# Patient Record
Sex: Male | Born: 1970
Health system: Southern US, Community
[De-identification: ages and names within clinical notes are randomized; demographics above are authoritative.]

## PROBLEM LIST (undated history)

## (undated) DIAGNOSIS — F32A Depression, unspecified: Secondary | ICD-10-CM

## (undated) DIAGNOSIS — E119 Type 2 diabetes mellitus without complications: Secondary | ICD-10-CM

## (undated) DIAGNOSIS — M549 Dorsalgia, unspecified: Secondary | ICD-10-CM

## (undated) DIAGNOSIS — C801 Malignant (primary) neoplasm, unspecified: Secondary | ICD-10-CM

## (undated) DIAGNOSIS — F419 Anxiety disorder, unspecified: Secondary | ICD-10-CM

## (undated) DIAGNOSIS — J329 Chronic sinusitis, unspecified: Secondary | ICD-10-CM

## (undated) DIAGNOSIS — E079 Disorder of thyroid, unspecified: Secondary | ICD-10-CM

## (undated) DIAGNOSIS — I10 Essential (primary) hypertension: Secondary | ICD-10-CM

## (undated) DIAGNOSIS — G4733 Obstructive sleep apnea (adult) (pediatric): Secondary | ICD-10-CM

## (undated) DIAGNOSIS — F329 Major depressive disorder, single episode, unspecified: Secondary | ICD-10-CM

## (undated) DIAGNOSIS — Z9989 Dependence on other enabling machines and devices: Secondary | ICD-10-CM

## (undated) HISTORY — DX: Disorder of thyroid, unspecified: E07.9

## (undated) HISTORY — DX: Essential (primary) hypertension: I10

## (undated) HISTORY — DX: Chronic sinusitis, unspecified: J32.9

## (undated) HISTORY — DX: Malignant (primary) neoplasm, unspecified: C80.1

## (undated) HISTORY — DX: Dependence on other enabling machines and devices: Z99.89

## (undated) HISTORY — DX: Dorsalgia, unspecified: M54.9

## (undated) HISTORY — PX: OTHER SURGICAL HISTORY: SHX169

## (undated) HISTORY — DX: Obstructive sleep apnea (adult) (pediatric): G47.33

---

## 2002-02-01 HISTORY — PX: TOTAL THYROIDECTOMY: SHX2547

## 2003-12-16 ENCOUNTER — Ambulatory Visit: Payer: Self-pay | Admitting: Internal Medicine

## 2004-01-06 ENCOUNTER — Inpatient Hospital Stay: Payer: Self-pay | Admitting: Unknown Physician Specialty

## 2004-01-17 ENCOUNTER — Ambulatory Visit: Payer: Self-pay | Admitting: Oncology

## 2004-01-20 ENCOUNTER — Inpatient Hospital Stay: Payer: Self-pay | Admitting: Unknown Physician Specialty

## 2004-02-24 ENCOUNTER — Ambulatory Visit: Payer: Self-pay | Admitting: Oncology

## 2004-03-04 ENCOUNTER — Ambulatory Visit: Payer: Self-pay | Admitting: Oncology

## 2004-04-01 ENCOUNTER — Ambulatory Visit: Payer: Self-pay | Admitting: Oncology

## 2004-05-02 ENCOUNTER — Ambulatory Visit: Payer: Self-pay | Admitting: Oncology

## 2004-06-01 ENCOUNTER — Ambulatory Visit: Payer: Self-pay | Admitting: Oncology

## 2004-07-02 ENCOUNTER — Ambulatory Visit: Payer: Self-pay | Admitting: Oncology

## 2004-08-14 ENCOUNTER — Ambulatory Visit: Payer: Self-pay | Admitting: Oncology

## 2004-09-01 ENCOUNTER — Ambulatory Visit: Payer: Self-pay | Admitting: Oncology

## 2004-10-02 ENCOUNTER — Ambulatory Visit: Payer: Self-pay | Admitting: Oncology

## 2004-12-16 ENCOUNTER — Ambulatory Visit: Payer: Self-pay | Admitting: Oncology

## 2005-01-01 ENCOUNTER — Ambulatory Visit: Payer: Self-pay | Admitting: Oncology

## 2005-04-13 ENCOUNTER — Ambulatory Visit: Payer: Self-pay | Admitting: Oncology

## 2005-05-02 ENCOUNTER — Ambulatory Visit: Payer: Self-pay | Admitting: Oncology

## 2005-06-10 ENCOUNTER — Emergency Department: Payer: Self-pay | Admitting: General Practice

## 2005-10-12 ENCOUNTER — Ambulatory Visit: Payer: Self-pay | Admitting: Oncology

## 2005-11-01 ENCOUNTER — Ambulatory Visit: Payer: Self-pay | Admitting: Oncology

## 2006-04-15 ENCOUNTER — Ambulatory Visit: Payer: Self-pay | Admitting: Oncology

## 2006-05-03 ENCOUNTER — Ambulatory Visit: Payer: Self-pay | Admitting: Oncology

## 2006-10-03 ENCOUNTER — Ambulatory Visit: Payer: Self-pay | Admitting: Oncology

## 2006-10-12 ENCOUNTER — Ambulatory Visit: Payer: Self-pay | Admitting: Oncology

## 2006-10-21 ENCOUNTER — Ambulatory Visit: Payer: Self-pay | Admitting: Internal Medicine

## 2006-11-02 ENCOUNTER — Ambulatory Visit: Payer: Self-pay | Admitting: Oncology

## 2007-04-11 ENCOUNTER — Ambulatory Visit: Payer: Self-pay | Admitting: Internal Medicine

## 2007-05-03 ENCOUNTER — Ambulatory Visit: Payer: Self-pay | Admitting: Internal Medicine

## 2007-05-29 ENCOUNTER — Ambulatory Visit: Payer: Self-pay | Admitting: Internal Medicine

## 2007-06-02 ENCOUNTER — Ambulatory Visit: Payer: Self-pay | Admitting: Internal Medicine

## 2007-06-07 ENCOUNTER — Ambulatory Visit: Payer: Self-pay | Admitting: Internal Medicine

## 2007-06-15 ENCOUNTER — Ambulatory Visit: Payer: Self-pay | Admitting: Internal Medicine

## 2007-09-20 ENCOUNTER — Ambulatory Visit: Payer: Self-pay | Admitting: Gastroenterology

## 2008-07-24 ENCOUNTER — Ambulatory Visit: Payer: Self-pay | Admitting: Internal Medicine

## 2008-09-24 ENCOUNTER — Other Ambulatory Visit: Payer: Self-pay | Admitting: Internal Medicine

## 2008-10-21 ENCOUNTER — Ambulatory Visit: Payer: Self-pay | Admitting: Cardiovascular Disease

## 2009-02-01 DIAGNOSIS — G4733 Obstructive sleep apnea (adult) (pediatric): Secondary | ICD-10-CM

## 2009-02-01 HISTORY — DX: Obstructive sleep apnea (adult) (pediatric): G47.33

## 2009-04-25 ENCOUNTER — Ambulatory Visit: Payer: Self-pay | Admitting: Internal Medicine

## 2009-05-21 ENCOUNTER — Ambulatory Visit: Payer: Self-pay | Admitting: Internal Medicine

## 2010-04-17 ENCOUNTER — Other Ambulatory Visit: Payer: Self-pay | Admitting: Internal Medicine

## 2010-06-01 ENCOUNTER — Ambulatory Visit: Payer: Self-pay | Admitting: Internal Medicine

## 2010-10-22 ENCOUNTER — Other Ambulatory Visit: Payer: Self-pay | Admitting: Internal Medicine

## 2010-10-23 MED ORDER — LOSARTAN POTASSIUM 50 MG PO TABS: 50.0000 mg | ORAL_TABLET | Freq: Every day | ORAL | Status: DC

## 2011-02-12 ENCOUNTER — Other Ambulatory Visit: Payer: Self-pay | Admitting: *Deleted

## 2011-02-12 NOTE — Telephone Encounter (Signed)
Faxed request from [facility] employee pharmacy, last filled 12/21/10.

## 2011-02-17 MED ORDER — HYDROCODONE-ACETAMINOPHEN 7.5-750 MG PO TABS: 1.0000 | ORAL_TABLET | Freq: Three times a day (TID) | ORAL | Status: AC | PRN

## 2011-02-17 NOTE — Telephone Encounter (Signed)
No, my mistake.

## 2011-02-17 NOTE — Telephone Encounter (Signed)
Pt has been on 7.5/750.  Are you changing the dose?

## 2011-02-17 NOTE — Telephone Encounter (Signed)
Ok to call in #90 with 1 refill on the hydrocodone 5/500 to [facility] pharmacy

## 2011-02-18 ENCOUNTER — Telehealth: Payer: Self-pay | Admitting: Internal Medicine

## 2011-02-18 NOTE — Telephone Encounter (Signed)
Patient said pharmacy will not refill his hydrocodone until the dosage is confirmed.

## 2011-02-18 NOTE — Telephone Encounter (Signed)
Medicine called to pharmacy. 

## 2011-02-26 ENCOUNTER — Encounter: Payer: Self-pay | Admitting: Internal Medicine

## 2011-03-16 ENCOUNTER — Ambulatory Visit (INDEPENDENT_AMBULATORY_CARE_PROVIDER_SITE_OTHER): Payer: PRIVATE HEALTH INSURANCE | Admitting: Internal Medicine

## 2011-03-16 ENCOUNTER — Encounter: Payer: Self-pay | Admitting: Internal Medicine

## 2011-03-16 DIAGNOSIS — F419 Anxiety disorder, unspecified: Secondary | ICD-10-CM

## 2011-03-16 DIAGNOSIS — M545 Low back pain, unspecified: Secondary | ICD-10-CM

## 2011-03-16 DIAGNOSIS — E669 Obesity, unspecified: Secondary | ICD-10-CM

## 2011-03-16 DIAGNOSIS — E039 Hypothyroidism, unspecified: Secondary | ICD-10-CM

## 2011-03-16 DIAGNOSIS — M549 Dorsalgia, unspecified: Secondary | ICD-10-CM

## 2011-03-16 DIAGNOSIS — F411 Generalized anxiety disorder: Secondary | ICD-10-CM

## 2011-03-16 MED ORDER — ALPRAZOLAM 0.5 MG PO TBDP: 0.5000 mg | ORAL_TABLET | Freq: Every day | ORAL | Status: DC | PRN

## 2011-03-16 MED ORDER — ALPRAZOLAM 0.5 MG PO TABS: 0.5000 mg | ORAL_TABLET | Freq: Every day | ORAL | Status: DC | PRN

## 2011-03-16 NOTE — Assessment & Plan Note (Signed)
Doing the low glycemic index diet with q 3 hour snacks but not exercising more than once or twice a week.

## 2011-03-16 NOTE — Patient Instructions (Signed)
EAS  Carb control has less carbs then the muscle milk or the myoplex shakes.  Low carb wheat tortilla by [place]  (BJ and [employer] and [employer])  Joseph's makes a low carb pita bread and flatbread ([employer] and [employer])  Look for the multicolored bag of small peppers as snack

## 2011-03-16 NOTE — Progress Notes (Signed)
Subjective:    Patient ID: Steven Hoover, male    DOB: 26-Feb-1970, 41 y.o.   MRN: [id number]   HPI  Steven Hoover is a 41 yr old white male with a history of hypothyroidism secondary to total thyroidectomy for thyroid CA,  Hypertension and obesity who presents for his annual exam.  He has been following a low glycemic index diet for 6 months, but has not been able to engage in regular exercise due to work and family schedules.  He feels increased stress lately both at work due to impossible deadlines and last minute projects, and at home because his teenage son starts driving this week.  He is however, sleeping well since he started using CPAP every night.  Weight is stable.  Past Medical History  Diagnosis Date  . OSA on CPAP 2011    on auto VPAP, sleeping well humidied 02  . Thyroid disease   . Hypertension    Past Surgical History  Procedure Date  . Total thyroidectomy 2004    Current Outpatient Prescriptions on File Prior to Visit  Medication Sig Dispense Refill  . losartan (COZAAR) 50 MG tablet Take 1 tablet (50 mg total) by mouth daily.  90 tablet  3    Review of Systems  Constitutional: Negative for fever, chills, diaphoresis, activity change, appetite change, fatigue and unexpected weight change.  HENT: Negative for hearing loss, ear pain, nosebleeds, congestion, sore throat, facial swelling, rhinorrhea, sneezing, drooling, mouth sores, trouble swallowing, neck pain, neck stiffness, dental problem, voice change, postnasal drip, sinus pressure, tinnitus and ear discharge.   Eyes: Negative for photophobia, pain, discharge, redness, itching and visual disturbance.  Respiratory: Negative for apnea, cough, choking, chest tightness, shortness of breath, wheezing and stridor.   Cardiovascular: Negative for chest pain, palpitations and leg swelling.  Gastrointestinal: Negative for nausea, vomiting, abdominal pain, diarrhea, constipation, blood in stool, abdominal distention, anal  bleeding and rectal pain.  Genitourinary: Negative for dysuria, urgency, frequency, hematuria, flank pain, decreased urine volume, scrotal swelling, difficulty urinating and testicular pain.  Musculoskeletal: Negative for myalgias, back pain, joint swelling, arthralgias and gait problem.  Skin: Negative for color change, rash and wound.  Neurological: Negative for dizziness, tremors, seizures, syncope, speech difficulty, weakness, light-headedness, numbness and headaches.  Psychiatric/Behavioral: Negative for suicidal ideas, hallucinations, behavioral problems, confusion, sleep disturbance, dysphoric mood, decreased concentration and agitation. The patient is not nervous/anxious.        Objective:   Physical Exam  Constitutional: He is oriented to person, place, and time.  HENT:  Head: Normocephalic and atraumatic.  Mouth/Throat: Oropharynx is clear and moist.  Eyes: Conjunctivae and EOM are normal.  Neck: Normal range of motion. Neck supple. No JVD present. No thyromegaly present.  Cardiovascular: Normal rate, regular rhythm and normal heart sounds.   Pulmonary/Chest: Effort normal and breath sounds normal. He has no wheezes. He has no rales.  Abdominal: Soft. Bowel sounds are normal. He exhibits no mass. There is no tenderness. There is no rebound.  Genitourinary: Prostate normal and penis normal.  Musculoskeletal: Normal range of motion. He exhibits no edema.  Neurological: He is alert and oriented to person, place, and time.  Skin: Skin is warm and dry.  Psychiatric: He has a normal mood and affect.       Assessment & Plan:   Obesity (BMI 30.0-34.9) Doing the low glycemic index diet with q 3 hour snacks but not exercising more than once or twice a week.    Anxiety Spent 10  minutes dicussing sources of stress and wasy to handle.  Prn alprazolam for now, wants to avoid SSRIs bc of weight gain.

## 2011-03-17 DIAGNOSIS — M545 Low back pain, unspecified: Secondary | ICD-10-CM | POA: Insufficient documentation

## 2011-03-17 NOTE — Assessment & Plan Note (Signed)
He has lumbar DDD with occasional episodes of radiculopatjhy . Managed with prednisone and vicodin .

## 2011-03-17 NOTE — Assessment & Plan Note (Signed)
Spent 10 minutes dicussing sources of stress and wasy to handle.  Prn alprazolam for now, wants to avoid SSRIs bc of weight gain.

## 2011-03-24 ENCOUNTER — Telehealth: Payer: Self-pay | Admitting: Internal Medicine

## 2011-03-24 NOTE — Telephone Encounter (Signed)
Please look in the files for his old records before I respond to some areas of concern on his recent September labs    I received his employee labs from September and  A few areas of concern:  Thyroid medication needs to be changed and increased. Can you  confirm his dose so I can change it?   His other two issues I need to clarify with his old records before making any remarks bc 2) His liver enzyme is very elevated and I don't have his old records to compare but I believe he has a history of fatty liver but not sure   3) His hgba1c indicates a diagnosis of diabetes mellitus which I do not have previously in his chart

## 2011-03-26 NOTE — Telephone Encounter (Signed)
Records have been received.  Carollee Herter is looking into the possibility that records have been sent out to be abstracted.  Left message on voice mail for patient to call with synthroid dose.

## 2011-04-12 ENCOUNTER — Encounter: Payer: Self-pay | Admitting: Internal Medicine

## 2011-04-15 ENCOUNTER — Encounter: Payer: Self-pay | Admitting: Internal Medicine

## 2011-04-15 ENCOUNTER — Ambulatory Visit (INDEPENDENT_AMBULATORY_CARE_PROVIDER_SITE_OTHER): Payer: PRIVATE HEALTH INSURANCE | Admitting: Internal Medicine

## 2011-04-15 ENCOUNTER — Telehealth: Payer: Self-pay | Admitting: Internal Medicine

## 2011-04-15 VITALS — BP 108/78 | HR 106 | Temp 98.3°F | Ht 73.5 in | Wt 325.0 lb

## 2011-04-15 DIAGNOSIS — R7989 Other specified abnormal findings of blood chemistry: Secondary | ICD-10-CM

## 2011-04-15 DIAGNOSIS — R7309 Other abnormal glucose: Secondary | ICD-10-CM

## 2011-04-15 DIAGNOSIS — A09 Infectious gastroenteritis and colitis, unspecified: Secondary | ICD-10-CM

## 2011-04-15 DIAGNOSIS — M791 Myalgia, unspecified site: Secondary | ICD-10-CM

## 2011-04-15 DIAGNOSIS — A084 Viral intestinal infection, unspecified: Secondary | ICD-10-CM

## 2011-04-15 DIAGNOSIS — R112 Nausea with vomiting, unspecified: Secondary | ICD-10-CM | POA: Insufficient documentation

## 2011-04-15 DIAGNOSIS — R739 Hyperglycemia, unspecified: Secondary | ICD-10-CM

## 2011-04-15 DIAGNOSIS — K760 Fatty (change of) liver, not elsewhere classified: Secondary | ICD-10-CM | POA: Insufficient documentation

## 2011-04-15 DIAGNOSIS — IMO0001 Reserved for inherently not codable concepts without codable children: Secondary | ICD-10-CM

## 2011-04-15 LAB — TSH: TSH: 4.76 u[IU]/mL (ref 0.35–5.50)

## 2011-04-15 LAB — COMPREHENSIVE METABOLIC PANEL
ALT: 135 U/L — ABNORMAL HIGH (ref 0–53)
AST: 81 U/L — ABNORMAL HIGH (ref 0–37)
Albumin: 4.4 g/dL (ref 3.5–5.2)
Alkaline Phosphatase: 102 U/L (ref 39–117)
BUN: 11 mg/dL (ref 6–23)
CO2: 30 mEq/L (ref 19–32)
Calcium: 9.2 mg/dL (ref 8.4–10.5)
Chloride: 99 mEq/L (ref 96–112)
Creatinine, Ser: 0.9 mg/dL (ref 0.4–1.5)
GFR: 99.02 mL/min (ref >60.00)
Glucose, Bld: 336 mg/dL — ABNORMAL HIGH (ref 70–99)
Potassium: 4.6 mEq/L (ref 3.5–5.1)
Sodium: 136 mEq/L (ref 135–145)
Total Bilirubin: 0.3 mg/dL (ref 0.3–1.2)
Total Protein: 8 g/dL (ref 6.0–8.3)

## 2011-04-15 MED ORDER — ONDANSETRON 8 MG PO TBDP: 8.0000 mg | ORAL_TABLET | Freq: Three times a day (TID) | ORAL | Status: AC | PRN

## 2011-04-15 NOTE — Assessment & Plan Note (Signed)
Likely secondary to viral infection and possibly fatty infiltration the liver. We'll plan to repeat liver function test in one month.

## 2011-04-15 NOTE — Telephone Encounter (Signed)
[facility] Triage Call Report Triage Record Num: [id number] Operator: Geanie Berlin Patient Name: Steven Hoover Call Date & Time: 04/15/2011 10:03:34AM Patient Phone: [phone] PCP: Duncan Dull Patient Gender: Male PCP Fax : [phone] Patient DOB: Dec 28, 1970 Practice Name: [facility] Reason for Call: Caller: Philopater/Patient; PCP: Duncan Dull; CB#: [phone]; Call regarding Body Aches, Vomiting and Diarrhea; Onset: 04/12/11. Afebrile. Last emesis 04/13/11. Mild diarrhea continues with body aches especially in joints. Advised to see MD witin 24 hrs for persistent fatigue that came on suddenly without known causes per Fatigue Guideline and generalized joint pain with headache and fatigue per Muscle Pain Guideline. Appt scheduled for 1315 on 04/15/11 with Dr Dan Humphreys at [place] office. Protocol(s) Used: Fatigue Recommended Outcome per Protocol: See Provider within 24 hours Reason for Outcome: Persistent fatigue that came on suddenly without known cause. Care Advice: ~ Reduce physical stressors as much as possible. ~ Eat a balanced, low-fat, high-fiber diet and have regular meals. ~ Do not change medications or dosing regimen until provider is consulted. ~ SYMPTOM / CONDITION MANAGEMENT ~ List, or take, all current prescription(s), nonprescription or alternative medication(s) to provider for evaluation. ~ Speak with your provider about other symptoms that you may have; also tell them if the symptoms worsened. Call EMS 911 immediately if you have chest pain lasting more than few minutes, SOB, faintness, sweating, nausea or vomiting. ~ 04/15/2011 10:21:53AM Page 1 of 1 [email or web address] [facility] Triage Call Report Triage Record Num: [id number] Operator: Geanie Berlin Patient Name: Steven Hoover Call Date & Time: 04/15/2011 10:03:34AM Patient Phone: [phone] PCP: Duncan Dull Patient Gender: Male PCP Fax : [phone] Patient DOB: 09/21/1970  Practice Name: [facility] Reason for Call: Caller: Aedyn/Patient; PCP: Duncan Dull; CB#: [phone]; Call regarding Body Aches, Vomiting and Diarrhea; Onset: 04/12/11. Afebrile. Last emesis 04/13/11. Mild diarrhea continues with body aches especially in joints. Advised to see MD witin 24 hrs for persistent fatigue that came on suddenly without known causes per Fatigue Guideline and generalized joint pain with headache and fatigue per Muscle Pain Guideline. Appt scheduled for 1315 on 04/15/11 with Dr Dan Humphreys at [place] office. Protocol(s) Used: Muscle Pain Recommended Outcome per Protocol: See Provider within 24 hours Reason for Outcome: Generalized muscle pain AND headache, fatigue, joint pain or rash Care Advice: Call provider if you develop a temperature of 101.5 F (38.6 C) or greater, OR any temperature elevation if geriatric or immunocompromised (such as diabetes, HIV/AIDS, renal disease, chemotherapy, organ transplant, or chronic steroid use). ~ ~ SYMPTOM / CONDITION MANAGEMENT ~ List, or take, all current prescription(s), nonprescription or alternative medication(s) to provider for evaluation. Analgesic/Antipyretic Advice - NSAIDs: Consider aspirin, ibuprofen, naproxen or ketoprofen for pain or fever as directed on label or by pharmacist/provider. PRECAUTIONS: - If over 74 years of age, should not take longer than 1 week without consulting provider. EXCEPTIONS: - Should not be used if taking blood thinners or have bleeding problems. - Do not use if have history of sensitivity/allergy to any of these medications; or history of cardiovascular, ulcer, kidney, liver disease or diabetes unless approved by provider. - Do not exceed recommended dose or frequency. ~ 04/15/2011 10:21:55AM Page 1 of 1 [email or web address]

## 2011-04-15 NOTE — Assessment & Plan Note (Signed)
Likely secondary to viral gastroenteritis. Symptoms gradually improving.  Will use zofran prn nausea. Encouraged increased fluid intake. Will check electrolytes with labs. Follow up if symptoms not improving over the next 48hr.

## 2011-04-15 NOTE — Assessment & Plan Note (Signed)
Likely secondary to viral syndrome. Will check electrolytes and TSH with labs today. Tylenol prn pain. Follow up if symptoms not improving.

## 2011-04-15 NOTE — Progress Notes (Signed)
Subjective:    Patient ID: Steven Hoover, male    DOB: Aug 04, 1970, 41 y.o.   MRN: [id number]  HPI 41 year old male with history of obesity and hypothyroidism presents for acute visit complaining of approximately seven-day history of nausea, vomiting, diarrhea, general malaise, and myalgia. He reports his symptoms first began with nausea and vomiting. This persisted for 2-3 days. He then developed watery, nonbloody diarrhea. This persisted until yesterday. He is now able to tolerate bland foods and was able to tolerate chicken noodle soup for lunch today. He continues to have mild nausea. He notes that he initially had some subjective fever and chills but this has resolved. He now has diffuse myalgia as well as arthralgia. He notes that his son has been ill with similar symptoms. He has not taken any medication for his symptoms.  Outpatient Encounter Prescriptions as of 04/15/2011  Medication Sig Dispense Refill  . ALPRAZolam (XANAX) 0.5 MG tablet Take 1 tablet (0.5 mg total) by mouth daily as needed for anxiety.  20 tablet  0  . HYDROcodone-acetaminophen (VICODIN ES) 7.5-750 MG per tablet Take one by mouth as needed for back pain.      Marland Kitchen levothyroxine (SYNTHROID, LEVOTHROID) 100 MCG tablet Take 100 mcg by mouth daily.      Marland Kitchen levothyroxine (SYNTHROID, LEVOTHROID) 137 MCG tablet Take 137 mcg by mouth daily.      Marland Kitchen losartan (COZAAR) 50 MG tablet Take 1 tablet (50 mg total) by mouth daily.  90 tablet  3  . ondansetron (ZOFRAN-ODT) 8 MG disintegrating tablet Take 1 tablet (8 mg total) by mouth every 8 (eight) hours as needed for nausea.  30 tablet  0    Review of Systems  Constitutional: Positive for fever, chills and fatigue. Negative for activity change, appetite change and unexpected weight change.  Eyes: Negative for visual disturbance.  Respiratory: Negative for cough and shortness of breath.   Cardiovascular: Negative for chest pain, palpitations and leg swelling.  Gastrointestinal:  Positive for nausea, vomiting and diarrhea. Negative for abdominal pain and abdominal distention.  Genitourinary: Negative for dysuria, urgency and difficulty urinating.  Musculoskeletal: Positive for myalgias and arthralgias. Negative for gait problem.  Skin: Negative for color change and rash.  Hematological: Negative for adenopathy.  Psychiatric/Behavioral: Negative for sleep disturbance and dysphoric mood. The patient is not nervous/anxious.    BP 108/78  Pulse 106  Temp(Src) 98.3 F (36.8 C) (Oral)  Ht 6' 1.5" (1.867 m)  Wt 325 lb (147.419 kg)  BMI 42.30 kg/m2  SpO2 97%     Objective:   Physical Exam  Constitutional: He is oriented to person, place, and time. He appears well-developed and well-nourished. No distress.  HENT:  Head: Normocephalic and atraumatic.  Right Ear: External ear normal.  Left Ear: External ear normal.  Nose: Nose normal.  Mouth/Throat: Oropharynx is clear and moist. No oropharyngeal exudate.  Eyes: Conjunctivae and EOM are normal. Pupils are equal, round, and reactive to light. Right eye exhibits no discharge. Left eye exhibits no discharge. No scleral icterus.  Neck: Normal range of motion. Neck supple. No tracheal deviation present. No thyromegaly present.  Cardiovascular: Normal rate, regular rhythm and normal heart sounds.  Exam reveals no gallop and no friction rub.   No murmur heard. Pulmonary/Chest: Effort normal and breath sounds normal. No respiratory distress. He has no wheezes. He has no rales. He exhibits no tenderness.  Abdominal: Soft. Bowel sounds are normal. He exhibits no distension and no mass. There is tenderness (right  lower quadrant, mild). There is no rebound and no guarding.  Musculoskeletal: Normal range of motion. He exhibits no edema.  Lymphadenopathy:    He has no cervical adenopathy.  Neurological: He is alert and oriented to person, place, and time. No cranial nerve deficit. Coordination normal.  Skin: Skin is warm and dry.  No rash noted. He is not diaphoretic. No erythema. No pallor.  Psychiatric: He has a normal mood and affect. His behavior is normal. Judgment and thought content normal.          Assessment & Plan:

## 2011-04-15 NOTE — Assessment & Plan Note (Signed)
Symptoms gradually improving. Encouraged increased fluid intake and rest.  Zofran for nausea. Will stay home from work next 48hr.  Follow up if symptoms not resolved over next 48hr.

## 2011-04-15 NOTE — Assessment & Plan Note (Signed)
Noted on labs today. Review of previous labs shows some mildly elevated blood sugars in the past. Will bring patient in tomorrow to check hemoglobin A1c.

## 2011-04-15 NOTE — Telephone Encounter (Signed)
Call a nurse scheduled patient with Dr. Dan Humphreys.

## 2011-04-16 ENCOUNTER — Encounter: Payer: Self-pay | Admitting: Internal Medicine

## 2011-04-16 ENCOUNTER — Telehealth: Payer: Self-pay | Admitting: *Deleted

## 2011-04-16 ENCOUNTER — Other Ambulatory Visit (INDEPENDENT_AMBULATORY_CARE_PROVIDER_SITE_OTHER): Payer: PRIVATE HEALTH INSURANCE | Admitting: *Deleted

## 2011-04-16 DIAGNOSIS — R7989 Other specified abnormal findings of blood chemistry: Secondary | ICD-10-CM

## 2011-04-16 DIAGNOSIS — IMO0001 Reserved for inherently not codable concepts without codable children: Secondary | ICD-10-CM

## 2011-04-16 DIAGNOSIS — R7309 Other abnormal glucose: Secondary | ICD-10-CM

## 2011-04-16 DIAGNOSIS — E669 Obesity, unspecified: Secondary | ICD-10-CM

## 2011-04-16 DIAGNOSIS — F411 Generalized anxiety disorder: Secondary | ICD-10-CM

## 2011-04-16 DIAGNOSIS — M545 Low back pain, unspecified: Secondary | ICD-10-CM

## 2011-04-16 LAB — COMPREHENSIVE METABOLIC PANEL
ALT: 142 U/L — ABNORMAL HIGH (ref 0–53)
AST: 118 U/L — ABNORMAL HIGH (ref 0–37)
Albumin: 4.1 g/dL (ref 3.5–5.2)
Alkaline Phosphatase: 93 U/L (ref 39–117)
BUN: 13 mg/dL (ref 6–23)
CO2: 25 mEq/L (ref 19–32)
Calcium: 8.8 mg/dL (ref 8.4–10.5)
Chloride: 101 mEq/L (ref 96–112)
Creatinine, Ser: 0.8 mg/dL (ref 0.4–1.5)
GFR: 120.36 mL/min (ref >60.00)
Glucose, Bld: 331 mg/dL — ABNORMAL HIGH (ref 70–99)
Potassium: 4 mEq/L (ref 3.5–5.1)
Sodium: 135 mEq/L (ref 135–145)
Total Bilirubin: 0.7 mg/dL (ref 0.3–1.2)
Total Protein: 7.6 g/dL (ref 6.0–8.3)

## 2011-04-16 LAB — LIPASE: Lipase: 43 U/L (ref 11.0–59.0)

## 2011-04-16 LAB — HEMOGLOBIN A1C: Hgb A1c MFr Bld: 10.5 % — ABNORMAL HIGH (ref 4.6–6.5)

## 2011-04-16 MED ORDER — GLUCOSE BLOOD VI STRP: ORAL_STRIP | Status: AC

## 2011-04-16 MED ORDER — FREESTYLE LANCETS MISC: Status: DC

## 2011-04-16 MED ORDER — METFORMIN HCL 500 MG PO TABS: 500.0000 mg | ORAL_TABLET | Freq: Two times a day (BID) | ORAL | Status: DC

## 2011-04-16 NOTE — Telephone Encounter (Signed)
See result note.  

## 2011-04-16 NOTE — Patient Instructions (Signed)
Consider the Low Glycemic Index Diet and 6 smaller meals daily  To help manage your diabetes and weight :   7 AM Low carbohydrate Protein  Shakes (EAS Carb Control  Or Atkins ,  Available everywhere,   In  cases at BJs )  2.5 carbs  (Add or substitute a toasted sandwhich thin w/ peanut butter)  10 AM: Protein bar by Atkins (snack size,  Chocolate lover's variety at  BJ's)    Lunch: sandwich on pita bread or flatbread (Joseph's makes a pita bread and a flat bread , available at [employer] and [employer]J's; Toufayah makes a low carb flatbread available at [employer] and [employer])   3 PM:  Mid day :  Another protein bar,  Or a  cheese stick, 1/4 cup of almonds, walnuts, pistachios, pecans, peanuts,  Macadamia nuts  6 PM  Dinner:  "mean and green:"  Meat/chicken/fish, salad, and green veggie : use ranch, vinagrette,  Blue cheese, etc  9 PM snack : Breyer's low carb fudgiscle or  ice cream bar (Carb Smart) Weight Watcher's ice cream bar , or another protein shake

## 2011-04-16 NOTE — Telephone Encounter (Signed)
Message copied by Vernie Murders on Fri Apr 16, 2011  5:56 PM ------      Message from: Ronna Polio A      Created: Fri Apr 16, 2011 12:59 PM       I tried to call pt, but no answer. Labs are consistent with diabetes with quite elevated BG. He needs to come back to the office today if possible to get a glucometer to use to monitor BG twice daily over the weekend. I would like to start Metformin 500mg  by mouth twice daily.  He will need to have visit with Dr. Darrick Huntsman (his PCP) to discuss new onset DM this week.

## 2011-04-23 ENCOUNTER — Telehealth: Payer: Self-pay | Admitting: Internal Medicine

## 2011-04-23 ENCOUNTER — Other Ambulatory Visit: Payer: Self-pay | Admitting: Internal Medicine

## 2011-04-23 DIAGNOSIS — E039 Hypothyroidism, unspecified: Secondary | ICD-10-CM

## 2011-04-23 MED ORDER — LEVOTHYROXINE SODIUM 137 MCG PO TABS: 137.0000 ug | ORAL_TABLET | Freq: Every day | ORAL | Status: DC

## 2011-04-23 NOTE — Telephone Encounter (Signed)
Patient wife called wanting a referral to the life style center at [facility] for his diabetes.

## 2011-04-26 NOTE — Telephone Encounter (Signed)
Happy to do so, but warn her that I think they are pretty lenient  on the carb restriction for daibetics.

## 2011-04-27 ENCOUNTER — Encounter: Payer: Self-pay | Admitting: Internal Medicine

## 2011-05-03 ENCOUNTER — Telehealth: Payer: Self-pay | Admitting: Internal Medicine

## 2011-05-03 NOTE — Telephone Encounter (Signed)
Caller: Brenda/Patient; PCP: Duncan Dull; CB#: [phone]; ; ; Call regarding Sore Throat; has some congestion and mild cough as well.  c/o L earache.  Onset of symptoms 04/30/11.  Afebrile.  Per protocol, emergent symptoms denied; advised appt within 24 hours.  Appt sched 05/04/11 0800 in office with Dr. Darrick Huntsman.

## 2011-05-03 NOTE — Telephone Encounter (Signed)
[facility] Triage Call Report Triage Record Num: [id number] Operator: Chevis Pretty Patient Name: Tory Septer Call Date & Time: 05/03/2011 1:41:34PM Patient Phone: [phone] PCP: Duncan Dull Patient Gender: Male PCP Fax : [phone] Patient DOB: [birth year] Practice Name: [facility] Reason for Call: Caller: Abu/Patient; PCP: Duncan Dull; CB#: [phone]; ; ; Call regarding Sore Throat; has some congestion and mild cough as well. c/o L earache. Onset of symptoms 04/30/11. Afebrile. Per protocol, emergent symptoms denied; advised appt within 24 hours. Appt sched 05/04/11 0800 in office with Dr. Darrick Huntsman. Protocol(s) Used: Sore Throat or Hoarseness Recommended Outcome per Protocol: See Provider within 24 hours Reason for Outcome: New onset of painful, swollen glands on sides of neck or under jaw Care Advice: ~ Swollen lymph glands that persist more than two weeks must be evaluated by provider. Call provider if any of the following signs and symptoms develop: severe sore throat, enlarged tonsils with white or yellow patches, tender swollen glands, fever, generally feel ill, persistent low grade headache, or abdominal pain. ~ To help prevent the spread of infection, do not share eating or drinking utensils, personal care items like a toothbrush, or food. Wash hands often with soap and water or alcohol-based hand rub. ~ Avoid exposure to environmental irritants. Do not smoke and avoid second-hand smoke. Avoid outside activities on high pollution days. Instead of strong smelling commercial cleaning products, substitute vinegar and lemon juice. ~ ~ Stay home until your temperature returns to normal. Apply warm, moist soaks or compresses to the affected area for 20-30 minutes 3 to 4 times per day. Avoid burning skin by using water no hotter than bath water and by not lying on the compresses. ~ ~ HEALTH PROMOTION / MAINTENANCE ~ SYMPTOM / CONDITION  MANAGEMENT ~ INFECTION CONTROL ~ CAUTIONS Most adults need to drink 6-10 eight-ounce glasses (1.2-2.0 liters) of fluids per day unless previously told to limit fluid intake for other medical reasons. Limit fluids that contain caffeine, sugar or alcohol. Urine will be a very light yellow color when you drink enough fluids. ~ Analgesic/Antipyretic Advice - Acetaminophen: Consider acetaminophen as directed on label or by pharmacist/provider for pain or fever PRECAUTIONS: - Use if there is no history of liver disease, alcoholism, or intake of three or more alcohol drinks per day - Only if approved by provider during pregnancy or when breastfeeding - During pregnancy, acetaminophen should not be taken more than 3 consecutive days without telling provider - Do not exceed recommended dose or frequency ~ Sore Throat Relief: - Use warm salt water gargles 3 to 4 times/day, as needed (1/2 tsp. salt in 8 oz. [.2 liters] water). - Suck on hard candy, nonprescription or herbal throat lozenges (sugar-free if diabetic) - Eat soothing, soft food/fluids (broths, soups, or honey and lemon juice in hot tea, Popsicles, frozen yogurt or sherbet, scrambled eggs, cooked cereals, Jell-O or puddings) whichever is most comforting. - Avoid eating salty, spicy or acidic foods. ~ ~ Rest until symptoms improve. If more than [redacted] weeks pregnant, lie on left side when resting. If pregnancy is known or suspected, get advice from provider before using any nonprescription medication other than acetaminophen ~ 05/03/2011 1:49:34PM Page 1 of 2 CAN_TriageRpt_V2 [facility] Triage Call Report Patient Name: Javontae Marlette continuation page/s Analgesic/Antipyretic Advice - NSAIDs: Consider aspirin, ibuprofen, naproxen or ketoprofen for pain or fever as directed on label or by pharmacist/provider. PRECAUTIONS: - If over 38 years of age, should not take longer than 1 week without consulting provider.  EXCEPTIONS: - Should not  be used if taking blood thinners or have bleeding problems. - Do not use if have history of sensitivity/allergy to any of these medications; or history of cardiovascular, ulcer, kidney, liver disease or diabetes unless approved by provider. - Do not exceed recommended dose or frequency. ~ 05/03/2011 1:49:34PM Page 2 of 2 CAN_TriageRpt_V2

## 2011-05-04 ENCOUNTER — Ambulatory Visit (INDEPENDENT_AMBULATORY_CARE_PROVIDER_SITE_OTHER): Payer: PRIVATE HEALTH INSURANCE | Admitting: Internal Medicine

## 2011-05-04 ENCOUNTER — Encounter: Payer: Self-pay | Admitting: Internal Medicine

## 2011-05-04 VITALS — BP 126/72 | HR 83 | Temp 97.8°F | Resp 18 | Wt 315.2 lb

## 2011-05-04 DIAGNOSIS — E669 Obesity, unspecified: Secondary | ICD-10-CM

## 2011-05-04 DIAGNOSIS — J029 Acute pharyngitis, unspecified: Secondary | ICD-10-CM

## 2011-05-04 DIAGNOSIS — J329 Chronic sinusitis, unspecified: Secondary | ICD-10-CM

## 2011-05-04 DIAGNOSIS — E119 Type 2 diabetes mellitus without complications: Secondary | ICD-10-CM | POA: Insufficient documentation

## 2011-05-04 DIAGNOSIS — IMO0001 Reserved for inherently not codable concepts without codable children: Secondary | ICD-10-CM

## 2011-05-04 DIAGNOSIS — IMO0002 Reserved for concepts with insufficient information to code with codable children: Secondary | ICD-10-CM

## 2011-05-04 DIAGNOSIS — E1165 Type 2 diabetes mellitus with hyperglycemia: Secondary | ICD-10-CM

## 2011-05-04 DIAGNOSIS — H6690 Otitis media, unspecified, unspecified ear: Secondary | ICD-10-CM

## 2011-05-04 DIAGNOSIS — J020 Streptococcal pharyngitis: Secondary | ICD-10-CM | POA: Insufficient documentation

## 2011-05-04 MED ORDER — AMOXICILLIN-POT CLAVULANATE 875-125 MG PO TABS: 1.0000 | ORAL_TABLET | Freq: Two times a day (BID) | ORAL | Status: AC

## 2011-05-04 NOTE — Assessment & Plan Note (Addendum)
Since hgba1c of 10.5 in march he has been taking metformin twice daily and following the low GI diet.  Sugars are now < 120 fasting and < 150 post prandial.  No changes today. Repeat a1c due in June. Reminded to start exercising in order to be able to start adding back entrees like occasional pizza , unless he wants to add mnore medication.

## 2011-05-04 NOTE — Assessment & Plan Note (Signed)
Improving,  With a 14 lb weight loss since February, achieved using the low glycemic index diet.

## 2011-05-04 NOTE — Progress Notes (Signed)
Patient ID: Steven Hoover, male   DOB: January 11, 1971, 41 y.o.   MRN: [id number]    Patient Active Problem List  Diagnoses  . Obesity (BMI 30.0-34.9)  . Anxiety  . Back pain, lumbosacral  . Nausea and vomiting  . Myalgia  . Viral gastroenteritis  . Hyperglycemia  . Elevated LFTs  . Diabetes mellitus type 2, uncontrolled  . Pharyngitis, streptococcal, acute    Subjective:  CC:   Chief Complaint  Patient presents with  . Sore Throat  . Cough    HPI:   Steven Hoover a 41 y.o. male who presents  Past Medical History  Diagnosis Date  . OSA on CPAP 2011    on auto VPAP, sleeping well humidied 02  . Thyroid disease   . Hypertension   . Back pain   . Sinusitis   . Cancer     thyroid    Past Surgical History  Procedure Date  . Total thyroidectomy 2004  . Adnoidectomy          The following portions of the patient's history were reviewed and updated as appropriate: Allergies, current medications, and problem list.    Review of Systems:   12 Pt  review of systems was negative except those addressed in the HPI,     History   Social History  . Marital Status: Married    Spouse Name: N/A    Number of Children: N/A  . Years of Education: N/A   Occupational History  . Not on file.   Social History Main Topics  . Smoking status: Former Smoker    Quit date: 08/02/2010  . Smokeless tobacco: Never Used  . Alcohol Use: 3.0 oz/week    5 Glasses of wine per week  . Drug Use: Not on file  . Sexually Active: Not on file   Other Topics Concern  . Not on file   Social History Narrative  . No narrative on file    Objective:  BP 126/72  Pulse 83  Temp(Src) 97.8 F (36.6 C) (Oral)  Resp 18  Wt 315 lb 4 oz (142.996 kg)  SpO2 96%  General appearance: alert, cooperative and appears stated age Ears: scarred  TM's, injected and erythematous without bulging.  Throat: lips, mucosa, and tongue normal; teeth and gums normal, tonsils injected and  enlarged.  Neck: mild  adenopathy, no carotid bruit, supple, symmetrical, trachea midline and thyroid not enlarged, symmetric, no tenderness/mass/nodules Back: symmetric, no curvature. ROM normal. No CVA tenderness. Lungs: clear to auscultation bilaterally Heart: regular rate and rhythm, S1, S2 normal, no murmur, click, rub or gallop Abdomen: soft, non-tender; bowel sounds normal; no masses,  no organomegaly Pulses: 2+ and symmetric Skin: Skin color, texture, turgor normal. No rashes or lesions Lymph nodes: Cervical, supraclavicular, and axillary nodes normal.  Assessment and Plan:  Diabetes mellitus type 2, uncontrolled Since hgba1c of 10.5 in march he has been taking metformin twice daily and following the low GI diet.  Sugars are now < 120 fasting and < 150 post prandial.  No changes today. Repeat a1c due in June. Reminded to start exercising in order to be able to start adding back entrees like occasional pizza , unless he wants to add mnore medication.   Obesity (BMI 30.0-34.9) Improving,  With a 14 lb weight loss since February, achieved using the low glycemic index diet.   Pharyngitis, streptococcal, acute Suggested by exam, with sinusitis and otitis symptoms as well.  Augmentin bid x 7 days  Updated Medication List Outpatient Encounter Prescriptions as of 05/04/2011  Medication Sig Dispense Refill  . glucose blood (FREESTYLE LITE) test strip Use twice daily Dx: 250.02  100 each  5  . HYDROcodone-acetaminophen (VICODIN ES) 7.5-750 MG per tablet Take one by mouth as needed for back pain.      . Lancets (FREESTYLE) lancets Use as instructed  100 each  12  . levothyroxine (SYNTHROID, LEVOTHROID) 100 MCG tablet Take 100 mcg by mouth daily.      Marland Kitchen levothyroxine (SYNTHROID, LEVOTHROID) 137 MCG tablet Take 1 tablet (137 mcg total) by mouth daily.  90 tablet  3  . losartan (COZAAR) 50 MG tablet Take 1 tablet (50 mg total) by mouth daily.  90 tablet  3  . metFORMIN (GLUCOPHAGE) 500 MG  tablet Take 1 tablet (500 mg total) by mouth 2 (two) times daily with a meal.  60 tablet  1  . amoxicillin-clavulanate (AUGMENTIN) 875-125 MG per tablet Take 1 tablet by mouth 2 (two) times daily.  14 tablet  0     No orders of the defined types were placed in this encounter.    No Follow-up on file.

## 2011-05-04 NOTE — Assessment & Plan Note (Signed)
Suggested by exam, with sinusitis and otitis symptoms as well.  Augmentin bid x 7 days

## 2011-05-04 NOTE — Patient Instructions (Signed)
You have a viral  Syndrome .  The post nasal drip is causing your sore throat.  Lavage your sinuses twice daly with Simply saline nasal spray.  Use benadryl 25 mg every 8 hours and Sudafed PE 10 to 30 every 8 hours to manage the drainage and congestion.  Gargle with salt water often for the sore throat.  I will call in Cheratussin cough syrup (has codeine) for the cough.  I will call in Augmentin twice daily for 7 days.

## 2011-05-06 ENCOUNTER — Encounter: Payer: Self-pay | Admitting: Internal Medicine

## 2011-05-06 ENCOUNTER — Ambulatory Visit: Payer: PRIVATE HEALTH INSURANCE | Admitting: Internal Medicine

## 2011-05-10 ENCOUNTER — Ambulatory Visit: Payer: Self-pay | Admitting: Internal Medicine

## 2011-05-21 ENCOUNTER — Other Ambulatory Visit: Payer: Self-pay | Admitting: Internal Medicine

## 2011-05-21 DIAGNOSIS — E039 Hypothyroidism, unspecified: Secondary | ICD-10-CM

## 2011-05-21 MED ORDER — LEVOTHYROXINE SODIUM 100 MCG PO TABS: 100.0000 ug | ORAL_TABLET | Freq: Every day | ORAL | Status: DC

## 2011-06-02 ENCOUNTER — Ambulatory Visit: Payer: Self-pay | Admitting: Internal Medicine

## 2011-06-30 ENCOUNTER — Encounter: Payer: Self-pay | Admitting: Internal Medicine

## 2011-07-02 ENCOUNTER — Other Ambulatory Visit: Payer: Self-pay | Admitting: *Deleted

## 2011-07-02 DIAGNOSIS — M549 Dorsalgia, unspecified: Secondary | ICD-10-CM

## 2011-07-02 MED ORDER — HYDROCODONE-ACETAMINOPHEN 7.5-750 MG PO TABS: 1.0000 | ORAL_TABLET | Freq: Four times a day (QID) | ORAL | Status: DC | PRN

## 2011-07-05 ENCOUNTER — Ambulatory Visit: Payer: Self-pay | Admitting: Internal Medicine

## 2011-07-05 NOTE — Telephone Encounter (Signed)
Rx called to [facility] pharmacy. 

## 2011-08-02 ENCOUNTER — Ambulatory Visit: Payer: Self-pay | Admitting: Internal Medicine

## 2011-08-10 ENCOUNTER — Ambulatory Visit (INDEPENDENT_AMBULATORY_CARE_PROVIDER_SITE_OTHER): Payer: PRIVATE HEALTH INSURANCE | Admitting: Internal Medicine

## 2011-08-10 ENCOUNTER — Encounter: Payer: Self-pay | Admitting: Internal Medicine

## 2011-08-10 VITALS — BP 124/76 | HR 90 | Temp 98.5°F | Resp 16 | Wt 307.5 lb

## 2011-08-10 DIAGNOSIS — E1165 Type 2 diabetes mellitus with hyperglycemia: Secondary | ICD-10-CM

## 2011-08-10 DIAGNOSIS — E669 Obesity, unspecified: Secondary | ICD-10-CM

## 2011-08-10 DIAGNOSIS — M545 Low back pain, unspecified: Secondary | ICD-10-CM

## 2011-08-10 DIAGNOSIS — IMO0002 Reserved for concepts with insufficient information to code with codable children: Secondary | ICD-10-CM

## 2011-08-10 DIAGNOSIS — IMO0001 Reserved for inherently not codable concepts without codable children: Secondary | ICD-10-CM

## 2011-08-10 NOTE — Progress Notes (Signed)
Patient ID: Steven Hoover, male   DOB: 23-Feb-1970, 41 y.o.   MRN: [id number]  Patient Active Problem List  Diagnosis  . Obesity (BMI 30.0-34.9)  . Anxiety  . Back pain, lumbosacral  . Nausea and vomiting  . Myalgia  . Viral gastroenteritis  . Hyperglycemia  . Elevated LFTs  . Diabetes mellitus type 2, uncontrolled  . Pharyngitis, streptococcal, acute    Subjective:  CC:   Chief Complaint  Patient presents with  . Diabetes    follow up    HPI:   Steven Rondeau Puckettis a 41 y.o. male who presents Follow up on uncontrolled DM, obesity,  back pain.  He has lost 27 lb since Feb on the low GI diet.  Has more energy, more exercise tolerance.  Off all medications now,  bs 90 to 120 fasting,  2 hr post prandials 150 after a starchy meal, 120 after a low GI meal. Back still a daily issue aggravated by outdoor activities with occasional radiculopathy but infrequent, uses Vicodin twice daily for pain management.  Starting [employer] classes for core strengthening exercise.   Past Medical History  Diagnosis Date  . OSA on CPAP 2011    on auto VPAP, sleeping well humidied 02  . Thyroid disease   . Hypertension   . Back pain   . Sinusitis   . Cancer     thyroid    Past Surgical History  Procedure Date  . Total thyroidectomy 2004  . Adnoidectomy          The following portions of the patient's history were reviewed and updated as appropriate: Allergies, current medications, and problem list.    Review of Systems:   12 Pt  review of systems was negative except those addressed in the HPI,     History   Social History  . Marital Status: Married    Spouse Name: N/A    Number of Children: N/A  . Years of Education: N/A   Occupational History  . Not on file.   Social History Main Topics  . Smoking status: Former Smoker    Quit date: 08/02/2010  . Smokeless tobacco: Never Used  . Alcohol Use: 3.0 oz/week    5 Glasses of wine per week  . Drug Use: Not on file  .  Sexually Active: Not on file   Other Topics Concern  . Not on file   Social History Narrative  . No narrative on file    Objective:  BP 124/76  Pulse 90  Temp 98.5 F (36.9 C) (Oral)  Resp 16  Wt 307 lb 8 oz (139.481 kg)  SpO2 97%  General appearance: alert, cooperative and appears stated age Ears: normal TM's and external ear canals both ears Throat: lips, mucosa, and tongue normal; teeth and gums normal Neck: no adenopathy, no carotid bruit, supple, symmetrical, trachea midline and thyroid not enlarged, symmetric, no tenderness/mass/nodules Back: symmetric, no curvature. ROM normal. No CVA tenderness. Lungs: clear to auscultation bilaterally Heart: regular rate and rhythm, S1, S2 normal, no murmur, click, rub or gallop Abdomen: soft, non-tender; bowel sounds normal; no masses,  no organomegaly Pulses: 2+ and symmetric Skin: Skin color, texture, turgor normal. No rashes or lesions Lymph nodes: Cervical, supraclavicular, and axillary nodes normal.  Assessment and Plan: Diabetes mellitus type 2, uncontrolled Blood sugars are now < 120 fasting and < 15 pot prandial on a low GI diet and he has lst 27 lbs since Feb.  Repeat A1c  Urine micro  al cr ratio due,  Eye exam due  Foot exam done. And normal   Obesity (BMI 30.0-34.9) Improving with low GI diet and exercise ,  No indication for medication at this time.   Back pain, lumbosacral Chronic, controlled with Vicodin,  Not currently sciatic,  Continue wt loss, exercise,    Updated Medication List Outpatient Encounter Prescriptions as of 08/10/2011  Medication Sig Dispense Refill  . glucose blood (FREESTYLE LITE) test strip Use twice daily Dx: 250.02  100 each  5  . HYDROcodone-acetaminophen (VICODIN ES) 7.5-750 MG per tablet Take 1 tablet by mouth every 6 (six) hours as needed for pain. Take one by mouth as needed for back pain.  60 tablet  2  . Lancets (FREESTYLE) lancets Use as instructed  100 each  12  . levothyroxine  (SYNTHROID, LEVOTHROID) 100 MCG tablet Take 1 tablet (100 mcg total) by mouth daily.  90 tablet  3  . levothyroxine (SYNTHROID, LEVOTHROID) 137 MCG tablet Take 1 tablet (137 mcg total) by mouth daily.  90 tablet  3  . losartan (COZAAR) 50 MG tablet Take 1 tablet (50 mg total) by mouth daily.  90 tablet  3  . DISCONTD: metFORMIN (GLUCOPHAGE) 500 MG tablet Take 1 tablet (500 mg total) by mouth 2 (two) times daily with a meal.  60 tablet  1

## 2011-08-10 NOTE — Assessment & Plan Note (Signed)
Blood sugars are now < 120 fasting and < 15 pot prandial on a low GI diet and he has lst 27 lbs since Feb.  Repeat A1c  Urine micro al cr ratio due,  Eye exam due  Foot exam done. And normal

## 2011-08-11 NOTE — Assessment & Plan Note (Signed)
Chronic, controlled with Vicodin,  Not currently sciatic,  Continue wt loss, exercise,

## 2011-08-11 NOTE — Assessment & Plan Note (Signed)
Improving with low GI diet and exercise ,  No indication for medication at this time.

## 2011-08-12 ENCOUNTER — Other Ambulatory Visit: Payer: Self-pay | Admitting: Internal Medicine

## 2011-08-12 LAB — LIPID PANEL
Cholesterol: 94 mg/dL (ref 0–200)
HDL Cholesterol: 40 mg/dL (ref 40–60)
Ldl Cholesterol, Calc: 41 mg/dL (ref 0–100)
Triglycerides: 63 mg/dL (ref 0–200)
VLDL Cholesterol, Calc: 13 mg/dL (ref 5–40)

## 2011-08-12 LAB — HEMOGLOBIN A1C: Hemoglobin A1C: 6.5 % — ABNORMAL HIGH (ref 4.2–6.3)

## 2011-08-15 ENCOUNTER — Telehealth: Payer: Self-pay | Admitting: Internal Medicine

## 2011-08-15 NOTE — Telephone Encounter (Signed)
Congratulation Steven Hoover on lowering his A1c to 6.5 , and his cholesterol is really, really low (LDL 41, trigs 63,  HDL good at 40)  No changes to meds.  Follow up i 3 months

## 2011-08-16 NOTE — Telephone Encounter (Signed)
Patient notified

## 2011-08-24 ENCOUNTER — Encounter: Payer: Self-pay | Admitting: Internal Medicine

## 2011-09-02 ENCOUNTER — Ambulatory Visit: Payer: Self-pay | Admitting: Internal Medicine

## 2011-09-06 LAB — HM DIABETES EYE EXAM

## 2011-09-08 ENCOUNTER — Encounter: Payer: Self-pay | Admitting: Internal Medicine

## 2011-09-22 ENCOUNTER — Ambulatory Visit (INDEPENDENT_AMBULATORY_CARE_PROVIDER_SITE_OTHER): Payer: PRIVATE HEALTH INSURANCE | Admitting: Internal Medicine

## 2011-09-22 ENCOUNTER — Encounter: Payer: Self-pay | Admitting: Internal Medicine

## 2011-09-22 VITALS — BP 122/72 | HR 88 | Temp 98.4°F | Resp 16 | Wt 319.5 lb

## 2011-09-22 DIAGNOSIS — R4589 Other symptoms and signs involving emotional state: Secondary | ICD-10-CM

## 2011-09-22 DIAGNOSIS — E1165 Type 2 diabetes mellitus with hyperglycemia: Secondary | ICD-10-CM

## 2011-09-22 DIAGNOSIS — IMO0001 Reserved for inherently not codable concepts without codable children: Secondary | ICD-10-CM

## 2011-09-22 DIAGNOSIS — M545 Low back pain, unspecified: Secondary | ICD-10-CM

## 2011-09-22 DIAGNOSIS — K429 Umbilical hernia without obstruction or gangrene: Secondary | ICD-10-CM | POA: Insufficient documentation

## 2011-09-22 DIAGNOSIS — IMO0002 Reserved for concepts with insufficient information to code with codable children: Secondary | ICD-10-CM

## 2011-09-22 DIAGNOSIS — F411 Generalized anxiety disorder: Secondary | ICD-10-CM

## 2011-09-22 DIAGNOSIS — F419 Anxiety disorder, unspecified: Secondary | ICD-10-CM

## 2011-09-22 MED ORDER — CITALOPRAM HYDROBROMIDE 10 MG PO TABS: 10.0000 mg | ORAL_TABLET | Freq: Every day | ORAL | Status: DC

## 2011-09-22 MED ORDER — ALPRAZOLAM 1 MG PO TABS: 1.0000 mg | ORAL_TABLET | Freq: Every evening | ORAL | Status: DC | PRN

## 2011-09-22 NOTE — Assessment & Plan Note (Addendum)
secodary to spinal stenosis by April 2011 MRI.  Pain hs been worse lately but not unbearable, managed with vicodin. Urged t continue with wt loss as this is aggravating it, may need repeat MRI and neurosurgical referral

## 2011-09-22 NOTE — Assessment & Plan Note (Addendum)
Now controlled with diet alone. Last hgba1c was 6. In July

## 2011-09-22 NOTE — Assessment & Plan Note (Signed)
Secondary to work stressors. Discussed pharmacologic ways to manage.  Screened for obsessive compulsive behaviors and mania.  Trial of celexa and prn alprazolam. Will titrate dose of citalopram  every 3 to 4 weeks prn

## 2011-09-22 NOTE — Assessment & Plan Note (Signed)
New onset, with constant pain . No color changes or signs of incarceration.  Refer to Steven Hoover for elective surgical repair.

## 2011-09-22 NOTE — Progress Notes (Signed)
Patient ID: [name], male   DOB: Jan 01, 1971, 41 y.o.   MRN: [id number]  Patient Active Problem List  Diagnosis  . Obesity (BMI 30.0-34.9)  . Anxiety  . Back pain, lumbosacral  . Elevated LFTs  . Diabetes mellitus type 2, uncontrolled    Subjective:  CC:   Chief Complaint  Patient presents with  . Hernia    HPI:   Steven Hoover a 41 y.o. male who presents  Past Medical History  Diagnosis Date  . OSA on CPAP 2011    on auto VPAP, sleeping well humidied 02  . Thyroid disease   . Hypertension   . Back pain   . Sinusitis   . Cancer     thyroid    Past Surgical History  Procedure Date  . Total thyroidectomy 2004  . Adnoidectomy          The following portions of the patient's history were reviewed and updated as appropriate: Allergies, current medications, and problem list.    Review of Systems:   12 Pt  review of systems was negative except those addressed in the HPI,     History   Social History  . Marital Status: Married    Spouse Name: N/A    Number of Children: N/A  . Years of Education: N/A   Occupational History  . Not on file.   Social History Main Topics  . Smoking status: Former Smoker    Quit date: 08/02/2010  . Smokeless tobacco: Never Used  . Alcohol Use: 3.0 oz/week    5 Glasses of wine per week  . Drug Use: Not on file  . Sexually Active: Not on file   Other Topics Concern  . Not on file   Social History Narrative  . No narrative on file    Objective:  BP 122/72  Pulse 88  Temp 98.4 F (36.9 C) (Oral)  Resp 16  Wt 319 lb 8 oz (144.924 kg)  SpO2 97%  General appearance: alert, cooperative and appears stated age Ears: normal TM's and external ear canals both ears Throat: lips, mucosa, and tongue normal; teeth and gums normal Neck: no adenopathy, no carotid bruit, supple, symmetrical, trachea midline and thyroid not enlarged, symmetric, no tenderness/mass/nodules Back: symmetric, no curvature. ROM  normal. No CVA tenderness. Lungs: clear to auscultation bilaterally Heart: regular rate and rhythm, S1, S2 normal, no murmur, click, rub or gallop Abdomen: soft, non-tender; bowel sounds normal; no masses,  no organomegaly Pulses: 2+ and symmetric Skin: Skin color, texture, turgor normal. No rashes or lesions Lymph nodes: Cervical, supraclavicular, and axillary nodes normal.  Assessment and Plan:  Hernia, umbilical New onset, with constant pain . No color changes or signs of incarceration.  Refer to Steven Hoover for elective surgical repair.   Diabetes mellitus type 2, uncontrolled Now controlled with diet alone. Last hgba1c was 6. In July  Anxiety Secondary to work stressors. Discussed pharmacologic ways to manage.  Screened for obsessive compulsive behaviors and mania.  Trial of celexa and prn alprazolam. Will titrate dose of citalopram  every 3 to 4 weeks prn   Back pain, lumbosacral secodary to spinal stenosis by April 2011 MRI.  Pain hs been worse lately but not unbearable, managed with vicodin. Urged t continue with wt loss as this is aggravating it, may need repeat MRI and neurosurgical referral   Updated Medication List Outpatient Encounter Prescriptions as of 09/22/2011  Medication Sig Dispense Refill  . glucose blood (FREESTYLE LITE) test  strip Use twice daily Dx: 250.02  100 each  5  . HYDROcodone-acetaminophen (VICODIN ES) 7.5-750 MG per tablet Take 1 tablet by mouth every 6 (six) hours as needed for pain. Take one by mouth as needed for back pain.  60 tablet  2  . Lancets (FREESTYLE) lancets Use as instructed  100 each  12  . levothyroxine (SYNTHROID, LEVOTHROID) 100 MCG tablet Take 1 tablet (100 mcg total) by mouth daily.  90 tablet  3  . levothyroxine (SYNTHROID, LEVOTHROID) 137 MCG tablet Take 1 tablet (137 mcg total) by mouth daily.  90 tablet  3  . losartan (COZAAR) 50 MG tablet Take 1 tablet (50 mg total) by mouth daily.  90 tablet  3  . ALPRAZolam (XANAX) 1 MG tablet  Take 1 tablet (1 mg total) by mouth at bedtime as needed for sleep or anxiety.  30 tablet  3  . citalopram (CELEXA) 10 MG tablet Take 1 tablet (10 mg total) by mouth daily.  30 tablet  3

## 2011-10-01 ENCOUNTER — Encounter: Payer: Self-pay | Admitting: Internal Medicine

## 2011-10-01 DIAGNOSIS — M549 Dorsalgia, unspecified: Secondary | ICD-10-CM

## 2011-10-01 MED ORDER — HYDROCODONE-ACETAMINOPHEN 7.5-750 MG PO TABS: 1.0000 | ORAL_TABLET | Freq: Four times a day (QID) | ORAL | Status: DC | PRN

## 2011-10-01 NOTE — Telephone Encounter (Signed)
rx faxed to pharmacy

## 2011-11-01 ENCOUNTER — Telehealth: Payer: Self-pay | Admitting: Internal Medicine

## 2011-11-01 DIAGNOSIS — I10 Essential (primary) hypertension: Secondary | ICD-10-CM

## 2011-11-01 NOTE — Telephone Encounter (Signed)
Losartan potassium 50 mg 90 tablet Take one by mouth daily

## 2011-11-03 MED ORDER — LOSARTAN POTASSIUM 50 MG PO TABS: 50.0000 mg | ORAL_TABLET | Freq: Every day | ORAL | Status: DC

## 2011-11-03 NOTE — Telephone Encounter (Signed)
Review of record shows 90 days with 3 refills last sept 2012. rx sent

## 2011-11-03 NOTE — Telephone Encounter (Signed)
Requesting refill of Losartan 50 mg 1 po qd. Not on current led list. Please advise.

## 2011-11-10 ENCOUNTER — Ambulatory Visit (INDEPENDENT_AMBULATORY_CARE_PROVIDER_SITE_OTHER): Payer: PRIVATE HEALTH INSURANCE | Admitting: Internal Medicine

## 2011-11-10 ENCOUNTER — Encounter: Payer: Self-pay | Admitting: Internal Medicine

## 2011-11-10 VITALS — BP 130/76 | HR 83 | Temp 98.4°F | Ht 74.0 in | Wt 319.2 lb

## 2011-11-10 DIAGNOSIS — R197 Diarrhea, unspecified: Secondary | ICD-10-CM

## 2011-11-10 DIAGNOSIS — B9789 Other viral agents as the cause of diseases classified elsewhere: Secondary | ICD-10-CM

## 2011-11-10 DIAGNOSIS — B349 Viral infection, unspecified: Secondary | ICD-10-CM

## 2011-11-10 DIAGNOSIS — A09 Infectious gastroenteritis and colitis, unspecified: Secondary | ICD-10-CM

## 2011-11-10 DIAGNOSIS — E032 Hypothyroidism due to medicaments and other exogenous substances: Secondary | ICD-10-CM

## 2011-11-10 MED ORDER — CIPROFLOXACIN HCL 500 MG PO TABS: 500.0000 mg | ORAL_TABLET | Freq: Two times a day (BID) | ORAL | Status: DC

## 2011-11-10 NOTE — Progress Notes (Signed)
Patient ID: Steven Hoover, male   DOB: April 08, 1970, 41 y.o.   MRN: [id number]  Patient Active Problem List  Diagnosis  . Obesity (BMI 30.0-34.9)  . Anxiety  . Back pain, lumbosacral  . Elevated LFTs  . Diabetes mellitus type 2, uncontrolled  . Hernia, umbilical  . Hypothyroidism, iatrogenic  . Diarrhea, infectious, adult    Subjective:  CC:   Chief Complaint  Patient presents with  . Follow-up    HPI:   Steven Cosby Puckettis a 41 y.o. male who presents with Flu like symptoms.  His symptoms started 3 days ago with diarrhea, followed by arthralgias.  He has felt lethargic for the past 3 to4 days.  No vomiting.  4 to 5 runny stools daily. Has been following a bland diet.  No sick contacts, or recent travel.     Past Medical History  Diagnosis Date  . OSA on CPAP 2011    on auto VPAP, sleeping well humidied 02  . Thyroid disease   . Hypertension   . Back pain   . Sinusitis   . Cancer     thyroid    Past Surgical History  Procedure Date  . Total thyroidectomy 2004  . Adnoidectomy          The following portions of the patient's history were reviewed and updated as appropriate: Allergies, current medications, and problem list.    Review of Systems:   12 Pt  review of systems was negative except those addressed in the HPI,     History   Social History  . Marital Status: Married    Spouse Name: N/A    Number of Children: N/A  . Years of Education: N/A   Occupational History  . Not on file.   Social History Main Topics  . Smoking status: Former Smoker    Quit date: 08/02/2010  . Smokeless tobacco: Never Used  . Alcohol Use: 3.0 oz/week    5 Glasses of wine per week  . Drug Use: Not on file  . Sexually Active: Not on file   Other Topics Concern  . Not on file   Social History Narrative  . No narrative on file    Objective:  BP 130/76  Pulse 83  Temp 98.4 F (36.9 C) (Oral)  Ht 6\' 2"  (1.88 m)  Wt 319 lb 4 oz (144.811 kg)  BMI 40.99  kg/m2  SpO2 97%  General appearance: alert, cooperative and appears stated age Ears: normal TM's and external ear canals both ears Throat: lips, mucosa, and tongue normal; teeth and gums normal Neck: no adenopathy, no carotid bruit, supple, symmetrical, trachea midline and thyroid not enlarged, symmetric, no tenderness/mass/nodules Back: symmetric, no curvature. ROM normal. No CVA tenderness. Lungs: clear to auscultation bilaterally Heart: regular rate and rhythm, S1, S2 normal, no murmur, click, rub or gallop Abdomen: soft, non-tender; bowel sounds normal; no masses,  no organomegaly Pulses: 2+ and symmetric Skin: Skin color, texture, turgor normal. No rashes or lesions Lymph nodes: Cervical, supraclavicular, and axillary nodes normal.  Assessment and Plan:  Diarrhea, infectious, adult Ciprofloxacin 500 mg bid x 7 days,  immodium prn.  Clear liquid diet.    Updated Medication List Outpatient Encounter Prescriptions as of 11/10/2011  Medication Sig Dispense Refill  . citalopram (CELEXA) 10 MG tablet Take 1 tablet (10 mg total) by mouth daily.  30 tablet  3  . glucose blood (FREESTYLE LITE) test strip Use twice daily Dx: 250.02  100 each  5  .  HYDROcodone-acetaminophen (VICODIN ES) 7.5-750 MG per tablet Take 1 tablet by mouth every 6 (six) hours as needed for pain. Take one by mouth as needed for back pain.  60 tablet  2  . Lancets (FREESTYLE) lancets Use as instructed  100 each  12  . levothyroxine (SYNTHROID, LEVOTHROID) 100 MCG tablet Take 1 tablet (100 mcg total) by mouth daily.  90 tablet  3  . levothyroxine (SYNTHROID, LEVOTHROID) 137 MCG tablet Take 1 tablet (137 mcg total) by mouth daily.  90 tablet  3  . losartan (COZAAR) 50 MG tablet Take 1 tablet (50 mg total) by mouth daily.  90 tablet  3  . ciprofloxacin (CIPRO) 500 MG tablet Take 1 tablet (500 mg total) by mouth 2 (two) times daily.  14 tablet  0  . losartan (COZAAR) 50 MG tablet Take 1 tablet (50 mg total) by mouth daily.   90 tablet  3     No orders of the defined types were placed in this encounter.    No Follow-up on file.

## 2011-11-10 NOTE — Patient Instructions (Addendum)
You can try 2 tablets of immodium to help with the loose stools.    Try alternating ibuprofen with tylenol for the aches and pains.    Avoid dairy and roughage for a few more days.

## 2011-11-14 DIAGNOSIS — B349 Viral infection, unspecified: Secondary | ICD-10-CM | POA: Insufficient documentation

## 2011-11-14 DIAGNOSIS — A09 Infectious gastroenteritis and colitis, unspecified: Secondary | ICD-10-CM | POA: Insufficient documentation

## 2011-11-14 NOTE — Assessment & Plan Note (Signed)
Ciprofloxacin 500 mg bid x 7 days,  immodium prn.  Clear liquid diet.

## 2011-11-29 ENCOUNTER — Other Ambulatory Visit: Payer: Self-pay | Admitting: Internal Medicine

## 2011-11-29 LAB — COMPREHENSIVE METABOLIC PANEL
Albumin: 3.9 g/dL (ref 3.4–5.0)
Alkaline Phosphatase: 67 U/L (ref 50–136)
Anion Gap: 10 (ref 7–16)
BUN: 12 mg/dL (ref 7–18)
Bilirubin,Total: 0.5 mg/dL (ref 0.2–1.0)
Calcium, Total: 8.4 mg/dL — ABNORMAL LOW (ref 8.5–10.1)
Chloride: 104 mmol/L (ref 98–107)
Co2: 26 mmol/L (ref 21–32)
Creatinine: 0.87 mg/dL (ref 0.60–1.30)
EGFR (African American): >60
EGFR (Non-African Amer.): >60
Glucose: 127 mg/dL — ABNORMAL HIGH (ref 65–99)
Osmolality: 281 (ref 275–301)
Potassium: 4 mmol/L (ref 3.5–5.1)
SGOT(AST): 42 U/L — ABNORMAL HIGH (ref 15–37)
SGPT (ALT): 116 U/L — ABNORMAL HIGH (ref 12–78)
Sodium: 140 mmol/L (ref 136–145)
Total Protein: 7.3 g/dL (ref 6.4–8.2)

## 2011-12-01 ENCOUNTER — Telehealth: Payer: Self-pay | Admitting: Internal Medicine

## 2011-12-01 NOTE — Telephone Encounter (Signed)
and I received labs from October 28 complete metabolic panel and nothing else. No significant change. Liver enzymes still slightly elevated.

## 2011-12-01 NOTE — Telephone Encounter (Signed)
Left message on patient vm with lab results. 

## 2011-12-10 ENCOUNTER — Telehealth: Payer: Self-pay | Admitting: Internal Medicine

## 2011-12-10 ENCOUNTER — Encounter: Payer: Self-pay | Admitting: Internal Medicine

## 2011-12-10 DIAGNOSIS — E032 Hypothyroidism due to medicaments and other exogenous substances: Secondary | ICD-10-CM

## 2011-12-10 DIAGNOSIS — E039 Hypothyroidism, unspecified: Secondary | ICD-10-CM

## 2011-12-10 MED ORDER — LEVOTHYROXINE SODIUM 112 MCG PO TABS
112.0000 ug | ORAL_TABLET | Freq: Every day | ORAL | Status: DC
Start: 2011-12-10 — End: 2012-12-06

## 2011-12-10 NOTE — Telephone Encounter (Signed)
His cholesterol is fine by recent labs. I would like to increase his thyroid medication/.  Please confirm that he is taking 237 mcg daily.

## 2011-12-10 NOTE — Telephone Encounter (Signed)
Patient state that he is taking the 237 mcg

## 2011-12-15 ENCOUNTER — Encounter: Payer: Self-pay | Admitting: Internal Medicine

## 2012-01-05 ENCOUNTER — Other Ambulatory Visit: Payer: Self-pay

## 2012-01-05 DIAGNOSIS — M549 Dorsalgia, unspecified: Secondary | ICD-10-CM

## 2012-01-05 NOTE — Telephone Encounter (Signed)
Refill request for Hydrocodone  Acetaminophen7.5-750 mg. Ok to refill?

## 2012-01-07 ENCOUNTER — Other Ambulatory Visit: Payer: Self-pay

## 2012-01-07 MED ORDER — HYDROCODONE-ACETAMINOPHEN 7.5-750 MG PO TABS: 1.0000 | ORAL_TABLET | Freq: Four times a day (QID) | ORAL | Status: DC | PRN

## 2012-01-07 NOTE — Telephone Encounter (Signed)
Hydrocodone acetaminophen 7.5-750 faxed to [facility]

## 2012-01-24 ENCOUNTER — Other Ambulatory Visit: Payer: Self-pay | Admitting: Internal Medicine

## 2012-01-24 DIAGNOSIS — F411 Generalized anxiety disorder: Secondary | ICD-10-CM

## 2012-01-24 MED ORDER — CITALOPRAM HYDROBROMIDE 10 MG PO TABS: 10.0000 mg | ORAL_TABLET | Freq: Every day | ORAL | Status: DC

## 2012-01-24 NOTE — Telephone Encounter (Signed)
Citalopram HBR 10 mg tab  Take 1 tablet by mouth daily  #30

## 2012-03-18 ENCOUNTER — Other Ambulatory Visit: Payer: Self-pay

## 2012-03-29 ENCOUNTER — Encounter: Payer: Self-pay | Admitting: Internal Medicine

## 2012-03-30 MED ORDER — HYDROCODONE-ACETAMINOPHEN 7.5-300 MG PO TABS: 1.0000 | ORAL_TABLET | Freq: Four times a day (QID) | ORAL | Status: DC | PRN

## 2012-03-30 NOTE — Telephone Encounter (Signed)
Please call or fax the rx for vicdoin 7.5/300 to [facility] pharmacy for patient,  i printed it out

## 2012-03-31 ENCOUNTER — Other Ambulatory Visit: Payer: Self-pay | Admitting: General Practice

## 2012-03-31 MED ORDER — HYDROCODONE-ACETAMINOPHEN 7.5-325 MG PO TABS: 1.0000 | ORAL_TABLET | Freq: Four times a day (QID) | ORAL | Status: DC | PRN

## 2012-03-31 NOTE — Telephone Encounter (Signed)
This was called in

## 2012-04-17 ENCOUNTER — Other Ambulatory Visit: Payer: Self-pay | Admitting: *Deleted

## 2012-04-17 DIAGNOSIS — E039 Hypothyroidism, unspecified: Secondary | ICD-10-CM

## 2012-04-17 MED ORDER — LEVOTHYROXINE SODIUM 137 MCG PO TABS: 137.0000 ug | ORAL_TABLET | Freq: Every day | ORAL | Status: DC

## 2012-04-17 NOTE — Telephone Encounter (Signed)
Please advise. TSH has not been done in a year.

## 2012-04-20 ENCOUNTER — Encounter: Payer: Self-pay | Admitting: Internal Medicine

## 2012-04-20 LAB — HEMOGLOBIN A1C: Hgb A1c MFr Bld: 6.8 % — AB (ref 4.0–6.0)

## 2012-04-22 ENCOUNTER — Encounter: Payer: Self-pay | Admitting: Internal Medicine

## 2012-05-29 ENCOUNTER — Other Ambulatory Visit: Payer: Self-pay | Admitting: *Deleted

## 2012-05-29 DIAGNOSIS — F411 Generalized anxiety disorder: Secondary | ICD-10-CM

## 2012-05-29 MED ORDER — CITALOPRAM HYDROBROMIDE 10 MG PO TABS: 10.0000 mg | ORAL_TABLET | Freq: Every day | ORAL | Status: DC

## 2012-05-29 MED ORDER — GLUCOSE BLOOD VI STRP: ORAL_STRIP | Status: DC

## 2012-05-29 NOTE — Telephone Encounter (Signed)
Refill Request  Free Style Lite Test Strip  #100  Use twice daily

## 2012-07-17 ENCOUNTER — Ambulatory Visit (INDEPENDENT_AMBULATORY_CARE_PROVIDER_SITE_OTHER): Payer: 59 | Admitting: Internal Medicine

## 2012-07-17 ENCOUNTER — Encounter: Payer: Self-pay | Admitting: Internal Medicine

## 2012-07-17 VITALS — BP 112/82 | HR 91 | Temp 98.9°F | Resp 16 | Ht 74.0 in | Wt 318.5 lb

## 2012-07-17 DIAGNOSIS — Z9989 Dependence on other enabling machines and devices: Secondary | ICD-10-CM

## 2012-07-17 DIAGNOSIS — F411 Generalized anxiety disorder: Secondary | ICD-10-CM

## 2012-07-17 DIAGNOSIS — E119 Type 2 diabetes mellitus without complications: Secondary | ICD-10-CM

## 2012-07-17 DIAGNOSIS — E669 Obesity, unspecified: Secondary | ICD-10-CM

## 2012-07-17 DIAGNOSIS — G4733 Obstructive sleep apnea (adult) (pediatric): Secondary | ICD-10-CM | POA: Insufficient documentation

## 2012-07-17 DIAGNOSIS — Z0000 Encounter for general adult medical examination without abnormal findings: Secondary | ICD-10-CM

## 2012-07-17 DIAGNOSIS — R4589 Other symptoms and signs involving emotional state: Secondary | ICD-10-CM

## 2012-07-17 DIAGNOSIS — E032 Hypothyroidism due to medicaments and other exogenous substances: Secondary | ICD-10-CM

## 2012-07-17 DIAGNOSIS — E1169 Type 2 diabetes mellitus with other specified complication: Secondary | ICD-10-CM

## 2012-07-17 MED ORDER — CITALOPRAM HYDROBROMIDE 10 MG PO TABS: 10.0000 mg | ORAL_TABLET | Freq: Every day | ORAL | Status: DC

## 2012-07-17 MED ORDER — ALPRAZOLAM 1 MG PO TABS: 1.0000 mg | ORAL_TABLET | Freq: Every evening | ORAL | Status: DC | PRN

## 2012-07-17 NOTE — Assessment & Plan Note (Addendum)
Diagnosed years ago .Marland Kitchen  No titration in a while.   Gets sleep in the afternoon   Sleep med [facility] 's  Years agop ,  Needs overnight pusle oximetry

## 2012-07-17 NOTE — Assessment & Plan Note (Signed)
I have addressed  BMI and recommended resuming a  low glycemic index diet utilizing smaller more frequent meals to increase metabolism.  I have also recommended that patient start exercising with a goal of 30 minutes of aerobic exercise a minimum of 5 days per week using the [facility] gym .

## 2012-07-17 NOTE — Progress Notes (Signed)
Patient ID: Steven Hoover, male   DOB: November 20, 1970, 42 y.o.   MRN: [id number]  The patient is here for his annual male physical examination and management of other chronic and acute problems, including diabetes mellitus, insomnia, OSA on CPAP, and hypothyroidism .  He has had labile blood sugars for the last several months depending on his diet.    The risk factors are reflected in the social history.  The roster of all physicians providing medical care to patient - is listed in the Snapshot section of the chart.  Activities of daily living:  The patient is 100% independent in all ADLs: dressing, toileting, feeding as well as independent mobility  Home safety : The patient has smoke detectors in the home. He wears seatbelts.  There are no firearms at home. There is no violence in the home.   There is no risks for hepatitis, STDs or HIV. There is no   history of blood transfusion. There is no travel history to infectious disease endemic areas of the world.  The patient has seen their dentist in the last six month and  their eye doctor in the last year.  They do not  have excessive sun exposure. They have seen a dermatoloigist in the last year. Discussed the need for sun protection: hats, long sleeves and use of sunscreen if there is significant sun exposure.   Diet: the importance of a healthy diet is discussed. They do have a healthy diet.  The benefits of regular aerobic exercise were discussed. He does not exercise regularly Depression screen: there are no signs or vegative symptoms of depression- irritability, change in appetite, anhedonia, sadness/tearfullness. However he contineus to have insomnia .  The following portions of the patient's history were reviewed and updated as appropriate: allergies, current medications, past family history, past medical history,  past surgical history, past social history  and problem list.  Visual acuity was not assessed per patient preference since  he has regular follow up with his ophthalmologist. Hearing and body mass index were assessed and reviewed.   During the course of the visit the patient was educated and counseled about appropriate screening and preventive services including :  nutrition counseling, colorectal cancer screening, and recommended immunizations.    Objective:  BP 112/82  Pulse 91  Temp(Src) 98.9 F (37.2 C) (Oral)  Resp 16  Ht 6\' 2"  (1.88 m)  Wt 318 lb 8 oz (144.471 kg)  BMI 40.88 kg/m2  SpO2 98%  BP 112/82  Pulse 91  Temp(Src) 98.9 F (37.2 C) (Oral)  Resp 16  Ht 6\' 2"  (1.88 m)  Wt 318 lb 8 oz (144.471 kg)  BMI 40.88 kg/m2  SpO2 98%  General Appearance:    Alert, cooperative, no distress, appears stated age  Head:    Normocephalic, without obvious abnormality, atraumatic  Eyes:    PERRL, conjunctiva/corneas clear, EOM's intact, fundi    benign, both eyes       Ears:    Normal TM's and external ear canals, both ears  Nose:   Nares normal, septum midline, mucosa normal, no drainage   or sinus tenderness  Throat:   Lips, mucosa, and tongue normal; teeth and gums normal  Neck:   Supple, symmetrical, trachea midline, no adenopathy;       thyroid:  No enlargement/tenderness/nodules; no carotid   bruit or JVD  Back:     Symmetric, no curvature, ROM normal, no CVA tenderness  Lungs:     Clear to auscultation  bilaterally, respirations unlabored  Chest wall:    No tenderness or deformity  Heart:    Regular rate and rhythm, S1 and S2 normal, no murmur, rub   or gallop  Abdomen:     Soft, non-tender, bowel sounds active all four quadrants,    no masses, no organomegaly  Genitalia:    Normal male without lesion, discharge or tenderness  Rectal:    Normal tone, normal prostate, no masses or tenderness;   guaiac negative stool  Extremities:   Extremities normal, atraumatic, no cyanosis or edema  Pulses:   2+ and symmetric all extremities  Skin:   Skin color, texture, turgor normal, no rashes or lesions   Lymph nodes:   Cervical, supraclavicular, and axillary nodes normal  Neurologic:   CNII-XII intact. Normal strength, sensation and reflexes      throughout   Assessment and Plan:  OSA on CPAP Diagnosed years ago .Marland Kitchen  No titration in a while.   Gets sleep in the afternoon   Sleep med [facility] 2 's  Years agop ,  Needs overnight pusle oximetry   Obesity (BMI 30.0-34.9) I have addressed  BMI and recommended resuming a  low glycemic index diet utilizing smaller more frequent meals to increase metabolism.  I have also recommended that patient start exercising with a goal of 30 minutes of aerobic exercise a minimum of 5 days per week using the [facility] gym .   Diabetes mellitus type 2 in obese hgba1c  in March 2014 was 6.8 per patient, done at [facility] .  He has been noncompliant with dietary control and has regained weight .  Repeat A1c is due    Hypothyroidism, iatrogenic Secondary to thyroidectomy.  Repeat TSH and Free T4 due on total dose of 249 mcg   Routine general medical examination at a health care facility Annual male exam was done including testicular  exam. Colon ca screening was reviewed and options given.     Updated Medication List Outpatient Encounter Prescriptions as of 07/17/2012  Medication Sig Dispense Refill  . citalopram (CELEXA) 10 MG tablet Take 1 tablet (10 mg total) by mouth daily.  90 tablet  1  . glucose blood test strip Use twice daily  100 each  0  . levothyroxine (SYNTHROID, LEVOTHROID) 112 MCG tablet Take 1 tablet (112 mcg total) by mouth daily.  90 tablet  3  . levothyroxine (SYNTHROID, LEVOTHROID) 137 MCG tablet Take 1 tablet (137 mcg total) by mouth daily.  90 tablet  3  . losartan (COZAAR) 50 MG tablet Take 1 tablet (50 mg total) by mouth daily.  90 tablet  3  . [DISCONTINUED] citalopram (CELEXA) 10 MG tablet Take 1 tablet (10 mg total) by mouth daily.  30 tablet  1  . [DISCONTINUED] HYDROcodone-acetaminophen (NORCO) 7.5-325 MG per tablet Take 1 tablet by mouth every 6  (six) hours as needed for pain.  60 tablet  3  . ALPRAZolam (XANAX) 1 MG tablet Take 1 tablet (1 mg total) by mouth at bedtime as needed for sleep or anxiety.  30 tablet  5  . ciprofloxacin (CIPRO) 500 MG tablet Take 1 tablet (500 mg total) by mouth 2 (two) times daily.  14 tablet  0  . Hydrocodone-Acetaminophen 7.5-300 MG TABS Take 1 tablet by mouth every 6 (six) hours as needed (pain).  60 each  3  . losartan (COZAAR) 50 MG tablet Take 1 tablet (50 mg total) by mouth daily.  90 tablet  3  . [DISCONTINUED] ALPRAZolam (  XANAX) 1 MG tablet Take 1 tablet (1 mg total) by mouth at bedtime as needed for sleep or anxiety.  30 tablet  3   No facility-administered encounter medications on file as of 07/17/2012.

## 2012-07-17 NOTE — Patient Instructions (Addendum)

## 2012-07-18 ENCOUNTER — Encounter: Payer: Self-pay | Admitting: Internal Medicine

## 2012-07-18 DIAGNOSIS — Z0001 Encounter for general adult medical examination with abnormal findings: Secondary | ICD-10-CM | POA: Insufficient documentation

## 2012-07-18 DIAGNOSIS — Z0000 Encounter for general adult medical examination without abnormal findings: Secondary | ICD-10-CM | POA: Insufficient documentation

## 2012-07-18 LAB — HM DIABETES FOOT EXAM: HM Diabetic Foot Exam: NORMAL

## 2012-07-18 NOTE — Assessment & Plan Note (Signed)
Secondary to thyroidectomy.  Repeat TSH and Free T4 due on total dose of 249 mcg

## 2012-07-18 NOTE — Assessment & Plan Note (Signed)
Annual male exam was done including testicular  exam. Colon ca screening was reviewed and options given.

## 2012-07-18 NOTE — Assessment & Plan Note (Signed)
hgba1c  in March 2014 was 6.8 per patient, done at [facility] .  He has been noncompliant with dietary control and has regained weight .  Repeat A1c is due

## 2012-07-24 ENCOUNTER — Telehealth: Payer: Self-pay | Admitting: *Deleted

## 2012-07-24 DIAGNOSIS — E669 Obesity, unspecified: Secondary | ICD-10-CM

## 2012-07-24 DIAGNOSIS — E1169 Type 2 diabetes mellitus with other specified complication: Secondary | ICD-10-CM

## 2012-07-24 MED ORDER — FREESTYLE LANCETS MISC: Status: DC

## 2012-07-24 NOTE — Telephone Encounter (Signed)
Freestyle lancets  #100  Use as directed

## 2012-07-26 ENCOUNTER — Other Ambulatory Visit: Payer: Self-pay | Admitting: *Deleted

## 2012-07-27 MED ORDER — HYDROCODONE-ACETAMINOPHEN 7.5-300 MG PO TABS: 1.0000 | ORAL_TABLET | Freq: Four times a day (QID) | ORAL | Status: DC | PRN

## 2012-08-23 ENCOUNTER — Other Ambulatory Visit: Payer: Self-pay | Admitting: Internal Medicine

## 2012-08-23 LAB — COMPREHENSIVE METABOLIC PANEL
Albumin: 4.1 g/dL (ref 3.4–5.0)
Alkaline Phosphatase: 84 U/L (ref 50–136)
Anion Gap: 7 (ref 7–16)
BUN: 12 mg/dL (ref 7–18)
Bilirubin,Total: 0.5 mg/dL (ref 0.2–1.0)
Calcium, Total: 8.7 mg/dL (ref 8.5–10.1)
Chloride: 104 mmol/L (ref 98–107)
Co2: 26 mmol/L (ref 21–32)
Creatinine: 0.87 mg/dL (ref 0.60–1.30)
EGFR (African American): >60
EGFR (Non-African Amer.): >60
Glucose: 145 mg/dL — ABNORMAL HIGH (ref 65–99)
Osmolality: 276 (ref 275–301)
Potassium: 3.9 mmol/L (ref 3.5–5.1)
SGOT(AST): 53 U/L — ABNORMAL HIGH (ref 15–37)
SGPT (ALT): 95 U/L — ABNORMAL HIGH (ref 12–78)
Sodium: 137 mmol/L (ref 136–145)
Total Protein: 7.9 g/dL (ref 6.4–8.2)

## 2012-08-23 LAB — CBC WITH DIFFERENTIAL/PLATELET
Basophil #: 0.1 10*3/uL (ref 0.0–0.1)
Basophil %: 1.1 %
Eosinophil #: 0.3 10*3/uL (ref 0.0–0.7)
Eosinophil %: 3.3 %
HCT: 47.6 % (ref 40.0–52.0)
HGB: 16 g/dL (ref 13.0–18.0)
Lymphocyte #: 2.5 10*3/uL (ref 1.0–3.6)
Lymphocyte %: 30.4 %
MCH: 29.5 pg (ref 26.0–34.0)
MCHC: 33.7 g/dL (ref 32.0–36.0)
MCV: 88 fL (ref 80–100)
Monocyte #: 0.8 x10 3/mm (ref 0.2–1.0)
Monocyte %: 10.3 %
Neutrophil #: 4.5 10*3/uL (ref 1.4–6.5)
Neutrophil %: 54.9 %
Platelet: 285 10*3/uL (ref 150–440)
RBC: 5.44 10*6/uL (ref 4.40–5.90)
RDW: 13.5 % (ref 11.5–14.5)
WBC: 8.1 10*3/uL (ref 3.8–10.6)

## 2012-08-23 LAB — TSH: Thyroid Stimulating Horm: 6.24 u[IU]/mL — ABNORMAL HIGH

## 2012-08-23 LAB — LIPID PANEL
Cholesterol: 99 mg/dL (ref 0–200)
HDL Cholesterol: 46 mg/dL (ref 40–60)
Ldl Cholesterol, Calc: 28 mg/dL (ref 0–100)
Triglycerides: 125 mg/dL (ref 0–200)
VLDL Cholesterol, Calc: 25 mg/dL (ref 5–40)

## 2012-08-23 LAB — HEMOGLOBIN A1C
Hemoglobin A1C: 6.8 % — ABNORMAL HIGH (ref 4.2–6.3)
Hgb A1c MFr Bld: 6.8 % — AB (ref 4.0–6.0)

## 2012-08-25 ENCOUNTER — Encounter: Payer: Self-pay | Admitting: Emergency Medicine

## 2012-09-03 ENCOUNTER — Telehealth: Payer: Self-pay | Admitting: Internal Medicine

## 2012-09-03 DIAGNOSIS — E119 Type 2 diabetes mellitus without complications: Secondary | ICD-10-CM

## 2012-09-03 NOTE — Telephone Encounter (Signed)
I received a lab from [facility]; his A1c was  was fine at 6.8 but his lipids, CMET and TSH  were not resulted e has not had his urine micraoalbumin /cr ratio done in over a year and his must be done ASAP. Please have him come by the office to give [place] a urine sample   I also do not have a diabetic eye exam documented on him in over a year.  Please ask him when this was done and if it hasn't ,  We will be happy to refer him, but this must be done annually

## 2012-09-04 NOTE — Telephone Encounter (Signed)
When was his last exam with [facility]?  If he is overdue please maek hin an appt,  Reason:  Diabetes   Thanks

## 2012-09-04 NOTE — Telephone Encounter (Signed)
Thank you amber!

## 2012-09-04 NOTE — Telephone Encounter (Signed)
Patient was last seen at [facility] on 09/05/12. Patient now has an appointment on 8/29 @ 130

## 2012-09-04 NOTE — Telephone Encounter (Signed)
Patient sees [facility] for exam and patient scheduled to give Urine lab 09/05/12 FYI.

## 2012-09-04 NOTE — Telephone Encounter (Signed)
Correction: 09/06/11

## 2012-09-05 ENCOUNTER — Other Ambulatory Visit (INDEPENDENT_AMBULATORY_CARE_PROVIDER_SITE_OTHER): Payer: 59

## 2012-09-05 DIAGNOSIS — E119 Type 2 diabetes mellitus without complications: Secondary | ICD-10-CM

## 2012-09-06 LAB — MICROALBUMIN / CREATININE URINE RATIO
Creatinine,U: 71.5 mg/dL
Microalb Creat Ratio: 0.3 mg/g (ref 0.0–30.0)
Microalb, Ur: 0.2 mg/dL (ref 0.0–1.9)

## 2012-09-07 ENCOUNTER — Encounter: Payer: Self-pay | Admitting: Internal Medicine

## 2012-09-11 ENCOUNTER — Encounter: Payer: Self-pay | Admitting: Internal Medicine

## 2012-10-02 ENCOUNTER — Encounter: Payer: Self-pay | Admitting: Internal Medicine

## 2012-10-06 ENCOUNTER — Telehealth: Payer: Self-pay | Admitting: *Deleted

## 2012-10-06 MED ORDER — GLUCOSE BLOOD VI STRP: ORAL_STRIP | Status: DC

## 2012-10-06 NOTE — Telephone Encounter (Signed)
Refill Request  Freestyle lite test strip  #100  Use twice daily

## 2012-10-06 NOTE — Telephone Encounter (Signed)
Rx sent 

## 2012-11-01 ENCOUNTER — Encounter: Payer: Self-pay | Admitting: Internal Medicine

## 2012-11-02 ENCOUNTER — Other Ambulatory Visit: Payer: Self-pay | Admitting: *Deleted

## 2012-11-02 DIAGNOSIS — I10 Essential (primary) hypertension: Secondary | ICD-10-CM

## 2012-11-02 MED ORDER — LOSARTAN POTASSIUM 50 MG PO TABS: 50.0000 mg | ORAL_TABLET | Freq: Every day | ORAL | Status: DC

## 2012-12-06 ENCOUNTER — Other Ambulatory Visit: Payer: Self-pay | Admitting: *Deleted

## 2012-12-06 DIAGNOSIS — E039 Hypothyroidism, unspecified: Secondary | ICD-10-CM

## 2012-12-06 MED ORDER — LEVOTHYROXINE SODIUM 112 MCG PO TABS: 112.0000 ug | ORAL_TABLET | Freq: Every day | ORAL | Status: DC

## 2012-12-06 NOTE — Telephone Encounter (Signed)
Eprescribed.

## 2012-12-18 ENCOUNTER — Ambulatory Visit: Payer: Self-pay | Admitting: Internal Medicine

## 2012-12-20 ENCOUNTER — Ambulatory Visit: Payer: 59 | Admitting: Internal Medicine

## 2012-12-26 ENCOUNTER — Ambulatory Visit (INDEPENDENT_AMBULATORY_CARE_PROVIDER_SITE_OTHER): Payer: 59 | Admitting: Internal Medicine

## 2012-12-26 ENCOUNTER — Encounter: Payer: Self-pay | Admitting: Internal Medicine

## 2012-12-26 VITALS — BP 128/92 | HR 82 | Temp 98.3°F | Resp 12 | Ht 75.0 in | Wt 318.2 lb

## 2012-12-26 DIAGNOSIS — Z23 Encounter for immunization: Secondary | ICD-10-CM

## 2012-12-26 DIAGNOSIS — M545 Low back pain, unspecified: Secondary | ICD-10-CM

## 2012-12-26 DIAGNOSIS — E1169 Type 2 diabetes mellitus with other specified complication: Secondary | ICD-10-CM

## 2012-12-26 DIAGNOSIS — R7989 Other specified abnormal findings of blood chemistry: Secondary | ICD-10-CM

## 2012-12-26 DIAGNOSIS — M533 Sacrococcygeal disorders, not elsewhere classified: Secondary | ICD-10-CM

## 2012-12-26 DIAGNOSIS — I10 Essential (primary) hypertension: Secondary | ICD-10-CM

## 2012-12-26 DIAGNOSIS — E119 Type 2 diabetes mellitus without complications: Secondary | ICD-10-CM

## 2012-12-26 DIAGNOSIS — E669 Obesity, unspecified: Secondary | ICD-10-CM

## 2012-12-26 DIAGNOSIS — G4733 Obstructive sleep apnea (adult) (pediatric): Secondary | ICD-10-CM

## 2012-12-26 LAB — HM DIABETES FOOT EXAM: HM Diabetic Foot Exam: NORMAL

## 2012-12-26 MED ORDER — METFORMIN HCL 500 MG PO TABS: 500.0000 mg | ORAL_TABLET | Freq: Two times a day (BID) | ORAL | Status: DC

## 2012-12-26 NOTE — Patient Instructions (Signed)
You received the tetanus and the pneumonia vaccines today  Please get your blood pressure checked at [facility]  ,  Goal 130/80 or less  Resume metformin twice daily for diabetes.  Diabetes and Exercise Diabetes mellitus is a common, chronic disease, in which the pancreas is unable to adequately control blood glucose (sugar) levels. There are 2 types of diabetes. Type 1 diabetes patients are unable to produce insulin, a hormone that causes sugar in the blood to be stored in the body. People with type 1 diabetes may compensate by giving themselves injections of insulin. Type 2 diabetes involves not producing adequate amounts of insulin to control blood glucose levels. People with type 2 diabetes control their blood glucose by monitoring their food intake or by taking medicine. Exercise is an important part of diabetes treatment. During exercise, the muscles use a greater amount of glucose from the blood for energy. This lowers your blood glucose, which is the same effect you would get from taking insulin. It has been shown that endurance athletes are more sensitive to insulin than inactive people. SYMPTOMS  Many people with a mild case of diabetes have no symptoms. However, if left uncontrolled, diabetes can lead to several complications that could be prevented with treatment of the disease. General symptoms of diabetes include:   Frequent urination (polyuria).  Frequent thirst and drinking (polydipsia).  Increased food consumption (polyphagia).  Fatigue.  Poor exercise performance.  Blurred vision.  Inflammation of the vagina (vaginitis) caused by fungal infections.  Skin infections (uncommon).  Numbness in the feet,caused by nerve injury.  Kidney disease. CAUSES  The cause of most cases of diabetes is unknown. In children, diabetes is often due to an autoimmune response to the cells in the pancreas that make insulin. It is also linked with other diseases, such as cystic  fibrosis. Diabetes may have a genetic link. PREVENTION  Athletes should strive to begin exercise with blood glucose in a well-controlled state.  Feet should always be kept clean and dry.  Activities in which low blood sugar levels cannot be treated easily (scuba diving, rock climbing, swimming) should be avoided.  Anticipate alterations in diet or training to avoid low blood sugar (hypoglycemia) and high blood sugar (hyperglycemia).  Athletes should try to increase sugar consumption after strenuous exercise to avoid hypoglycemia.  Short-acting insulin should not be injected into an actively exercising muscle. The athlete should rest the injection site for about 1 hour after exercise.  Patients with diabetes should get routine checkups of the feet to prevent complications. PROGNOSIS  Exercise provides many benefits to the person with diabetes:   Reduced body fat.  Lower blood pressure.  Often, reduced need for medicines.  Improved exercise tolerance.  Lower insulin levels.  Weight loss.  Improved lipid profile (decreased cholesterol and low-density lipoproteins). RELATED COMPLICATIONS  If performed incorrectly, exercise can result in complications of diabetes:   Poor control of blood sugar, when exercise is performed at the wrong time.  Increase in renal disease, from loss of body fluids (dehydration).  Increased risk of nerve injury (neuropathy) when performing exercises that increase foot injury.  Increased risk of eye problems when performingactivities that involve breath holding or lowering or jarring the head.  Increased risk of sudden death from exercise in patients with heart disease.  Worsening of hypertension with heavy lifting (more than 10 lb/4.5 kg). Altered blood glucose and insulin dose as a result of mild illness that produces loss of appetite.  Altered uptake of insulin  after injection when insulin injection site is changed. NOTE: Exercise can lower  blood glucose effectively, but the effects are short-lasting (no more than a couple of days). Exercise has been shown to improve your sensitivity to insulin. This may alter how your body responds to a given dose of injected insulin. It is important for every patient with diabetes to know how his or her body may react to exercise, and to adjust insulin dosages accordingly. TREATMENT  Eat about 1 to 3 hours before exercise.  Check blood glucose immediately before and after exercise.  Stop exercise if blood glucose is more than 250 mg/dL.  Stop exercise if blood glucose is less than 100 mg/dL.  Do not exercise within 1 hour of an insulin injection.  Be prepared to treat low blood glucose while exercising. Keep some sugar product with you, such as a candy bar.  For prolonged exercise, use a sports drink to maintain your glucose level.  Replace used up glucose in the body after exercise.  Consume fluids during and after exercise to avoid dehydration. SEEK MEDICAL CARE IF:  You have vision changes after a run.  You notice a loss of sensation in your feet after exercise.  You have increased numbness, tingling, or pins and needles sensations after exercise.  You have chest pain during or after exercise.  You have a fast, irregular heartbeat (palpitations) during or after exercise.  Your exercise tolerance gets worse.  You have fainting or dizzy spells for brief periods during or after exercise. Document Released: 01/18/2005 Document Revised: 04/12/2011 Document Reviewed: 05/02/2008 [facility] Patient Information 2014 [place], [place].

## 2012-12-26 NOTE — Progress Notes (Signed)
Pre-visit discussion using our clinic review tool. No additional management support is needed unless otherwise documented below in the visit note.  

## 2012-12-26 NOTE — Progress Notes (Signed)
Patient ID: Steven Hoover, male   DOB: 04-Jan-1971, 42 y.o.   MRN: [id number]  Patient Active Problem List   Diagnosis Date Noted  . Essential hypertension, benign 12/29/2012  . Routine general medical examination at a health care facility 07/18/2012  . OSA on CPAP 07/17/2012  . Hypothyroidism, iatrogenic 11/10/2011  . Hernia, umbilical 09/22/2011  . Diabetes mellitus type 2 in obese 05/04/2011  . Elevated LFTs 04/15/2011  . Back pain, lumbosacral 03/17/2011  . Obesity (BMI 30.0-34.9) 03/16/2011  . Anxiety 03/16/2011    Subjective:  CC:   Chief Complaint  Patient presents with  . Follow-up    CBGs    HPI:   DARRALL STREY a 42 y.o. male who presents Follow up on Type 2 DM complicated by obesity.  His fasting sugars have been consistently elevated to  150 to 160.  Last a1c was over 3 months ago.  He is following a low GI diet and  exercising twice weekly at the most ,  Hindered from doing more by chronic back pain,  Current work schedule and his children's after school activities.  He works 10 to 12 hours daily at  [employer].     Past Medical History  Diagnosis Date  . OSA on CPAP 2011    on auto VPAP, sleeping well humidied 02  . Thyroid disease   . Hypertension   . Back pain   . Sinusitis   . Cancer     thyroid    Past Surgical History  Procedure Laterality Date  . Total thyroidectomy  2004  . Adnoidectomy         The following portions of the patient's history were reviewed and updated as appropriate: Allergies, current medications, and problem list.    Review of Systems:   12 Pt  review of systems was negative except those addressed in the HPI,     History   Social History  . Marital Status: Married    Spouse Name: N/A    Number of Children: N/A  . Years of Education: N/A   Occupational History  . Not on file.   Social History Main Topics  . Smoking status: Former Smoker    Quit date: 08/02/2010  . Smokeless tobacco: Never Used  .  Alcohol Use: 3.0 oz/week    5 Glasses of wine per week  . Drug Use: Not on file  . Sexual Activity: Not on file   Other Topics Concern  . Not on file   Social History Narrative  . No narrative on file    Objective:  Filed Vitals:   12/26/12 1439  BP: 128/92  Pulse: 82  Temp: 98.3 F (36.8 C)  Resp: 12     General appearance: alert, cooperative and appears stated age Ears: normal TM's and external ear canals both ears Throat: lips, mucosa, and tongue normal; teeth and gums normal Neck: no adenopathy, no carotid bruit, supple, symmetrical, trachea midline and thyroid not enlarged, symmetric, no tenderness/mass/nodules Back: symmetric, no curvature. ROM normal. No CVA tenderness. Lungs: clear to auscultation bilaterally Heart: regular rate and rhythm, S1, S2 normal, no murmur, click, rub or gallop Abdomen: soft, non-tender; bowel sounds normal; no masses,  no organomegaly Pulses: 2+ and symmetric Skin: Skin color, texture, turgor normal. No rashes or lesions Lymph nodes: Cervical, supraclavicular, and axillary nodes normal.  Assessment and Plan:  Diabetes mellitus type 2 in obese Loss of control with diet due to lack of exercise.  Resume metformin 500  mg bid.  I have addressed  BMI and recommended wt loss of 10% of body weigh over the next 6 months using a low glycemic index diet and regular exercise a minimum of 5 days per week.  Lab Results  Component Value Date   HGBA1C 6.7* 12/26/2012   Lab Results  Component Value Date   MICROALBUR 0.2 09/05/2012     Back pain, lumbosacral Continue vicodin prn.   Obesity (BMI 30.0-34.9) I have addressed  BMI and recommended wt loss of 10% of body weight over the next 6 months using a low glycemic index diet and regular exercise a minimum of 5 days per week.    OSA on CPAP Diagnosed by sleep study. he is wearing his CPAP every night a minimum of 6 hours per night.   Essential hypertension, benign .Elevated today.  Reviewed  list of meds, patient is not taking OTC meds that could be causing,. It.  Have asked patient to recheck bp at home a minimum of 5 times over the next 4 weeks and call readings to office for adjustment of medications.     Updated Medication List Outpatient Encounter Prescriptions as of 12/26/2012  Medication Sig  . citalopram (CELEXA) 10 MG tablet Take 1 tablet (10 mg total) by mouth daily.  Marland Kitchen glucose blood test strip Use twice daily  . Hydrocodone-Acetaminophen 7.5-300 MG TABS Take 1 tablet by mouth every 6 (six) hours as needed (pain).  . Lancets (FREESTYLE) lancets Use as directed  . levothyroxine (SYNTHROID, LEVOTHROID) 112 MCG tablet Take 1 tablet (112 mcg total) by mouth daily.  Marland Kitchen levothyroxine (SYNTHROID, LEVOTHROID) 137 MCG tablet Take 1 tablet (137 mcg total) by mouth daily.  Marland Kitchen losartan (COZAAR) 50 MG tablet Take 1 tablet (50 mg total) by mouth daily.  Marland Kitchen losartan (COZAAR) 50 MG tablet Take 1 tablet (50 mg total) by mouth daily.  . metFORMIN (GLUCOPHAGE) 500 MG tablet Take 1 tablet (500 mg total) by mouth 2 (two) times daily with a meal.  . [DISCONTINUED] ciprofloxacin (CIPRO) 500 MG tablet Take 1 tablet (500 mg total) by mouth 2 (two) times daily.     Orders Placed This Encounter  Procedures  . Tdap vaccine greater than or equal to 7yo IM  . Pneumococcal conjugate vaccine 13-valent  . Hemoglobin A1c  . Comprehensive metabolic panel  . TSH  . LDL cholesterol, direct  . Ambulatory referral to Ophthalmology  . HM DIABETES FOOT EXAM    No Follow-up on file.

## 2012-12-27 LAB — COMPREHENSIVE METABOLIC PANEL
ALT: 74 U/L — ABNORMAL HIGH (ref 0–53)
AST: 39 U/L — ABNORMAL HIGH (ref 0–37)
Albumin: 4.1 g/dL (ref 3.5–5.2)
Alkaline Phosphatase: 57 U/L (ref 39–117)
BUN: 11 mg/dL (ref 6–23)
CO2: 23 mEq/L (ref 19–32)
Calcium: 9 mg/dL (ref 8.4–10.5)
Chloride: 102 mEq/L (ref 96–112)
Creatinine, Ser: 0.9 mg/dL (ref 0.4–1.5)
GFR: 104.9 mL/min (ref >60.00)
Glucose, Bld: 148 mg/dL — ABNORMAL HIGH (ref 70–99)
Potassium: 3.9 mEq/L (ref 3.5–5.1)
Sodium: 135 mEq/L (ref 135–145)
Total Bilirubin: 0.7 mg/dL (ref 0.3–1.2)
Total Protein: 7.6 g/dL (ref 6.0–8.3)

## 2012-12-27 LAB — LDL CHOLESTEROL, DIRECT: Direct LDL: 53.6 mg/dL

## 2012-12-27 LAB — HEMOGLOBIN A1C: Hgb A1c MFr Bld: 6.7 % — ABNORMAL HIGH (ref 4.6–6.5)

## 2012-12-27 LAB — TSH: TSH: 2.16 u[IU]/mL (ref 0.35–5.50)

## 2012-12-29 ENCOUNTER — Encounter: Payer: Self-pay | Admitting: Internal Medicine

## 2012-12-29 DIAGNOSIS — R7989 Other specified abnormal findings of blood chemistry: Secondary | ICD-10-CM | POA: Insufficient documentation

## 2012-12-29 DIAGNOSIS — I10 Essential (primary) hypertension: Secondary | ICD-10-CM | POA: Insufficient documentation

## 2012-12-29 NOTE — Assessment & Plan Note (Signed)
Elevated today.  Reviewed list of meds, patient is not taking OTC meds that could be causing,. It.  Have asked patient to recheck bp at home a minimum of 5 times over the next 4 weeks and call readings to office for adjustment of medications.   

## 2012-12-29 NOTE — Assessment & Plan Note (Signed)
Continue vicodin prn.

## 2012-12-29 NOTE — Assessment & Plan Note (Signed)
I have addressed  BMI and recommended wt loss of 10% of body weight over the next 6 months using a low glycemic index diet and regular exercise a minimum of 5 days per week.   

## 2012-12-29 NOTE — Assessment & Plan Note (Signed)
Diagnosed by sleep study. he is wearing his CPAP every night a minimum of 6 hours per night.

## 2012-12-29 NOTE — Assessment & Plan Note (Addendum)
Loss of control with diet due to lack of exercise.  Resume metformin 500 mg bid.  I have addressed  BMI and recommended wt loss of 10% of body weigh over the next 6 months using a low glycemic index diet and regular exercise a minimum of 5 days per week.  Lab Results  Component Value Date   HGBA1C 6.7* 12/26/2012   Lab Results  Component Value Date   MICROALBUR 0.2 09/05/2012

## 2012-12-29 NOTE — Assessment & Plan Note (Signed)
r liver enzyme remains elevated for unclear reasons.   Ihave ordered  additional blood tests to rule out various infections and autoimmune disorders and  an ultrasound of the liver to examine its size and general appearance.

## 2012-12-29 NOTE — Addendum Note (Signed)
Addended by: Sherlene Shams on: 12/29/2012 02:58 PM   Modules accepted: Orders

## 2013-01-16 ENCOUNTER — Observation Stay: Payer: Self-pay | Admitting: Student

## 2013-01-16 LAB — BASIC METABOLIC PANEL
Anion Gap: 6 — ABNORMAL LOW (ref 7–16)
BUN: 10 mg/dL (ref 7–18)
Calcium, Total: 9.1 mg/dL (ref 8.5–10.1)
Chloride: 105 mmol/L (ref 98–107)
Co2: 25 mmol/L (ref 21–32)
Creatinine: 0.8 mg/dL (ref 0.60–1.30)
EGFR (African American): >60
EGFR (Non-African Amer.): >60
Glucose: 109 mg/dL — ABNORMAL HIGH (ref 65–99)
Osmolality: 272 (ref 275–301)
Potassium: 3.9 mmol/L (ref 3.5–5.1)
Sodium: 136 mmol/L (ref 136–145)

## 2013-01-16 LAB — CK TOTAL AND CKMB (NOT AT ARMC)
CK, Total: 166 U/L (ref 35–232)
CK, Total: 166 U/L (ref 35–232)
CK-MB: 1 ng/mL (ref 0.5–3.6)
CK-MB: 0.9 ng/mL (ref 0.5–3.6)

## 2013-01-16 LAB — CBC
HCT: 45.4 % (ref 40.0–52.0)
HGB: 15.8 g/dL (ref 13.0–18.0)
MCH: 30.3 pg (ref 26.0–34.0)
MCHC: 34.8 g/dL (ref 32.0–36.0)
MCV: 87 fL (ref 80–100)
Platelet: 274 10*3/uL (ref 150–440)
RBC: 5.21 10*6/uL (ref 4.40–5.90)
RDW: 13.4 % (ref 11.5–14.5)
WBC: 6.9 10*3/uL (ref 3.8–10.6)

## 2013-01-16 LAB — TROPONIN I
Troponin-I: <0.02 ng/mL
Troponin-I: <0.02 ng/mL
Troponin-I: <0.02 ng/mL

## 2013-01-16 LAB — HEMOGLOBIN A1C: Hemoglobin A1C: 6.3 % (ref 4.2–6.3)

## 2013-01-17 ENCOUNTER — Telehealth: Payer: Self-pay | Admitting: Internal Medicine

## 2013-01-17 DIAGNOSIS — R079 Chest pain, unspecified: Secondary | ICD-10-CM

## 2013-01-17 DIAGNOSIS — I517 Cardiomegaly: Secondary | ICD-10-CM

## 2013-01-17 LAB — CBC WITH DIFFERENTIAL/PLATELET
Basophil #: 0.1 10*3/uL (ref 0.0–0.1)
Basophil %: 1 %
Eosinophil #: 0.2 10*3/uL (ref 0.0–0.7)
Eosinophil %: 3.4 %
HCT: 43.7 % (ref 40.0–52.0)
HGB: 14.9 g/dL (ref 13.0–18.0)
Lymphocyte #: 2.2 10*3/uL (ref 1.0–3.6)
Lymphocyte %: 35.6 %
MCH: 29.9 pg (ref 26.0–34.0)
MCHC: 34.1 g/dL (ref 32.0–36.0)
MCV: 88 fL (ref 80–100)
Monocyte #: 0.7 x10 3/mm (ref 0.2–1.0)
Monocyte %: 10.4 %
Neutrophil #: 3.1 10*3/uL (ref 1.4–6.5)
Neutrophil %: 49.6 %
Platelet: 244 10*3/uL (ref 150–440)
RBC: 4.98 10*6/uL (ref 4.40–5.90)
RDW: 13.5 % (ref 11.5–14.5)
WBC: 6.3 10*3/uL (ref 3.8–10.6)

## 2013-01-17 LAB — BASIC METABOLIC PANEL
Anion Gap: 4 — ABNORMAL LOW (ref 7–16)
BUN: 11 mg/dL (ref 7–18)
Calcium, Total: 8.9 mg/dL (ref 8.5–10.1)
Chloride: 103 mmol/L (ref 98–107)
Co2: 30 mmol/L (ref 21–32)
Creatinine: 1.03 mg/dL (ref 0.60–1.30)
EGFR (African American): >60
EGFR (Non-African Amer.): >60
Glucose: 147 mg/dL — ABNORMAL HIGH (ref 65–99)
Osmolality: 276 (ref 275–301)
Potassium: 3.9 mmol/L (ref 3.5–5.1)
Sodium: 137 mmol/L (ref 136–145)

## 2013-01-17 LAB — LIPID PANEL
Cholesterol: 89 mg/dL (ref 0–200)
HDL Cholesterol: 43 mg/dL (ref 40–60)
Ldl Cholesterol, Calc: 27 mg/dL (ref 0–100)
Triglycerides: 94 mg/dL (ref 0–200)
VLDL Cholesterol, Calc: 19 mg/dL (ref 5–40)

## 2013-01-17 LAB — TSH: Thyroid Stimulating Horm: 0.632 u[IU]/mL

## 2013-01-17 LAB — MAGNESIUM: Magnesium: 2 mg/dL

## 2013-01-17 NOTE — Telephone Encounter (Signed)
The patient has a hospital follow up scheduled on 12.31.14 . I asked the unit secretary to send record she stated that they have a packet they give them now on discharge.

## 2013-01-23 ENCOUNTER — Telehealth: Payer: Self-pay

## 2013-01-23 NOTE — Telephone Encounter (Signed)
Patient contacted regarding discharge from [facility] on 01/17/13.  Patient understands to follow up with provider Dr. Mariah Milling on 02/05/13 at 9:15 at [facility]. Patient understands discharge instructions? yes Patient understands medications and regiment? yes Patient understands to bring all medications to this visit? yes

## 2013-01-31 ENCOUNTER — Ambulatory Visit: Payer: 59 | Admitting: Internal Medicine

## 2013-02-05 ENCOUNTER — Ambulatory Visit (INDEPENDENT_AMBULATORY_CARE_PROVIDER_SITE_OTHER): Payer: 59 | Admitting: Cardiovascular Disease

## 2013-02-05 ENCOUNTER — Encounter: Payer: Self-pay | Admitting: Cardiovascular Disease

## 2013-02-05 VITALS — BP 124/92 | HR 84 | Ht 74.0 in | Wt 319.0 lb

## 2013-02-05 DIAGNOSIS — R079 Chest pain, unspecified: Secondary | ICD-10-CM | POA: Insufficient documentation

## 2013-02-05 DIAGNOSIS — R0789 Other chest pain: Secondary | ICD-10-CM

## 2013-02-05 DIAGNOSIS — E119 Type 2 diabetes mellitus without complications: Secondary | ICD-10-CM

## 2013-02-05 DIAGNOSIS — I10 Essential (primary) hypertension: Secondary | ICD-10-CM

## 2013-02-05 DIAGNOSIS — E669 Obesity, unspecified: Secondary | ICD-10-CM

## 2013-02-05 DIAGNOSIS — E1169 Type 2 diabetes mellitus with other specified complication: Secondary | ICD-10-CM

## 2013-02-05 NOTE — Patient Instructions (Addendum)
You are doing well. No medication changes were made.  Please call us if you have new issues that need to be addressed before your next appt.  Your physician wants you to follow-up in: 12 months.  You will receive a reminder letter in the mail two months in advance. If you don't receive a letter, please call our office to schedule the follow-up appointment. 

## 2013-02-05 NOTE — Progress Notes (Signed)
Patient ID: Steven Hoover, male    DOB: 1970-02-17, 43 y.o.   MRN: [id number]  HPI Comments: Mr. Weyenberg is a 43 year old gentleman with recent admission to the hospital 01/16/2013 with discharge December 17, who presents for followup today. He presented to the hospital with chest pain, negative cardiac enzymes, Myoview that showed no ischemia. Echocardiogram was done 01/17/2013 that showed ejection fraction 60-65%. Essentially a normal study Was felt his symptoms could have been secondary to reflux and he was prescribed a proton pump inhibitor   He reports having a rare sharp pain in his left chest not typically associated with exertion. He does have significant stress at work, works in the supply chain.  [facility] showed normal sinus rhythm with rate 82 beats per minute with no significant ST or T wave changes Hemoglobin A1c 6.3, normal renal function, negative cardiac enzymes Total cholesterol 89, LDL 27, HDL 43, TSH 0.632  EKG today shows normal sinus rhythm with rate 84 beats per minute with no significant ST or T wave changes     Outpatient Encounter Prescriptions as of 02/05/2013  Medication Sig  . citalopram (CELEXA) 10 MG tablet Take 1 tablet (10 mg total) by mouth daily.  Marland Kitchen glucose blood test strip Use twice daily  . Lancets (FREESTYLE) lancets Use as directed  . levothyroxine (SYNTHROID, LEVOTHROID) 125 MCG tablet Take 125 mcg by mouth daily before breakfast.  . levothyroxine (SYNTHROID, LEVOTHROID) 137 MCG tablet Take 1 tablet (137 mcg total) by mouth daily.  Marland Kitchen losartan (COZAAR) 50 MG tablet Take 1 tablet (50 mg total) by mouth daily.  . metFORMIN (GLUCOPHAGE) 500 MG tablet Take 1 tablet (500 mg total) by mouth 2 (two) times daily with a meal.  . omeprazole (PRILOSEC) 20 MG capsule Take 20 mg by mouth daily.    Review of Systems  Constitutional: Negative.   HENT: Negative.   Eyes: Negative.   Respiratory: Negative.   Cardiovascular: Negative.        Sharp  stabbing chest pain on occasion, typically at rest  Gastrointestinal: Negative.   Endocrine: Negative.   Musculoskeletal: Negative.   Skin: Negative.   Allergic/Immunologic: Negative.   Neurological: Negative.   Hematological: Negative.   Psychiatric/Behavioral: Negative.   All other systems reviewed and are negative.    BP 124/92  Pulse 84  Ht 6\' 2"  (1.88 m)  Wt 319 lb (144.697 kg)  BMI 40.94 kg/m2  Physical Exam  Nursing note and vitals reviewed. Constitutional: He is oriented to person, place, and time. He appears well-developed and well-nourished.  HENT:  Head: Normocephalic.  Nose: Nose normal.  Mouth/Throat: Oropharynx is clear and moist.  Eyes: Conjunctivae are normal. Pupils are equal, round, and reactive to light.  Neck: Normal range of motion. Neck supple. No JVD present.  Cardiovascular: Normal rate, regular rhythm, S1 normal, S2 normal, normal heart sounds and intact distal pulses.  Exam reveals no gallop and no friction rub.   No murmur heard. Pulmonary/Chest: Effort normal and breath sounds normal. No respiratory distress. He has no wheezes. He has no rales. He exhibits no tenderness.  Abdominal: Soft. Bowel sounds are normal. He exhibits no distension. There is no tenderness.  Musculoskeletal: Normal range of motion. He exhibits no edema and no tenderness.  Lymphadenopathy:    He has no cervical adenopathy.  Neurological: He is alert and oriented to person, place, and time. Coordination normal.  Skin: Skin is warm and dry. No rash noted. No erythema.  Psychiatric: He  has a normal mood and affect. His behavior is normal. Judgment and thought content normal.      Assessment and Plan

## 2013-02-05 NOTE — Assessment & Plan Note (Signed)
Encouraged him to watch his diet, try to exercise daily

## 2013-02-05 NOTE — Assessment & Plan Note (Signed)
We have encouraged continued exercise, careful diet management in an effort to lose weight. 

## 2013-02-05 NOTE — Assessment & Plan Note (Signed)
Atypical in nature, recent negative stress test, normal echocardiogram. No further testing at this time

## 2013-02-05 NOTE — Assessment & Plan Note (Signed)
Recommended he closely monitor his blood pressure at home. Diastolic pressures are mildly elevated today in the office

## 2013-02-12 ENCOUNTER — Encounter: Payer: Self-pay | Admitting: *Deleted

## 2013-02-15 ENCOUNTER — Other Ambulatory Visit: Payer: Self-pay | Admitting: Internal Medicine

## 2013-02-19 ENCOUNTER — Ambulatory Visit (INDEPENDENT_AMBULATORY_CARE_PROVIDER_SITE_OTHER): Payer: 59 | Admitting: Internal Medicine

## 2013-02-19 ENCOUNTER — Encounter: Payer: Self-pay | Admitting: Internal Medicine

## 2013-02-19 VITALS — BP 120/84 | HR 84 | Temp 97.8°F | Resp 16 | Wt 323.5 lb

## 2013-02-19 DIAGNOSIS — E119 Type 2 diabetes mellitus without complications: Secondary | ICD-10-CM

## 2013-02-19 DIAGNOSIS — F411 Generalized anxiety disorder: Secondary | ICD-10-CM

## 2013-02-19 DIAGNOSIS — R079 Chest pain, unspecified: Secondary | ICD-10-CM

## 2013-02-19 DIAGNOSIS — F419 Anxiety disorder, unspecified: Secondary | ICD-10-CM

## 2013-02-19 DIAGNOSIS — E669 Obesity, unspecified: Secondary | ICD-10-CM

## 2013-02-19 DIAGNOSIS — E1169 Type 2 diabetes mellitus with other specified complication: Secondary | ICD-10-CM

## 2013-02-19 MED ORDER — BUSPIRONE HCL 7.5 MG PO TABS
7.5000 mg | ORAL_TABLET | Freq: Three times a day (TID) | ORAL | Status: DC
Start: 2013-02-19 — End: 2013-02-23

## 2013-02-19 MED ORDER — ALPRAZOLAM 1 MG PO TABS: 1.0000 mg | ORAL_TABLET | Freq: Every evening | ORAL | Status: DC | PRN

## 2013-02-19 NOTE — Patient Instructions (Signed)
I am starting you on daily buspirone for management of your current anxiety.   It can be taken up to three times daily , but most people find  Considerable benefit at just twice daily.   If your chest pain recurs,  Resume the PPI (omeprazole 40 mg or prevacid 40 mg , both available otc in 20 mg doses) and call to set up an ultrasound of gallbladder

## 2013-02-19 NOTE — Progress Notes (Signed)
Pre-visit discussion using our clinic review tool. No additional management support is needed unless otherwise documented below in the visit note.  

## 2013-02-19 NOTE — Progress Notes (Signed)
Patient ID: Steven Hoover, male   DOB: [birth year], 43 y.o.   MRN: [id number]   Patient Active Problem List   Diagnosis Date Noted  . Chest pain 02/05/2013  . Essential hypertension, benign 12/29/2012  . Other abnormal blood chemistry 12/29/2012  . Routine general medical examination at a health care facility 07/18/2012  . OSA on CPAP 07/17/2012  . Hypothyroidism, iatrogenic 11/10/2011  . Hernia, umbilical 48/54/6270  . Diabetes mellitus type 2 in obese 05/04/2011  . Elevated LFTs 04/15/2011  . Back pain, lumbosacral 03/17/2011  . Obesity (BMI 30.0-34.9) 03/16/2011  . Anxiety 03/16/2011    Subjective:  CC:   No chief complaint on file.   HPI:   [name] a 43 y.o. male who presents followup on multiple issues. He was Admitted to [facility] on Dec 19th with chest pain .  Stress test and ECHO were both normal  Done by Dr Esmond Plants  Follow up one year.  Events reviewed today,  His episode of chest pain  occurred while sitting at work,  stressful environment,  Lots of tension,  Chest started hurtin and he got nauseated, dry heaves,  Diaphoretic. Attributed it to metformin but glucose was normal.  Chest started hurting on the right severe ,  And constant .    In ED given two NTG tablets, which improved but not resolved.   Has changed his behavior at work.  Now taking a lunch break and going for a walk.  Not taking his work home any more and getting flack at work.  Took 28 days of otc prilosec ,  Has had no recurence of pain   Feels anxious all the time. Using alprazolam once or twice daily    Past Medical History  Diagnosis Date  . OSA on CPAP 2011    on auto VPAP, sleeping well humidied 02  . Thyroid disease   . Hypertension   . Back pain   . Sinusitis   . Cancer     thyroid    Past Surgical History  Procedure Laterality Date  . Total thyroidectomy  2004  . Adnoidectomy         The following portions of the patient's history were reviewed and updated as  appropriate: Allergies, current medications, and problem list.    Review of Systems:   12 Pt  review of systems was negative except those addressed in the HPI,     History   Social History  . Marital Status: Married    Spouse Name: N/A    Number of Children: N/A  . Years of Education: N/A   Occupational History  . Not on file.   Social History Main Topics  . Smoking status: Former Smoker    Quit date: 08/02/2010  . Smokeless tobacco: Never Used  . Alcohol Use: 3.0 oz/week    5 Glasses of wine per week  . Drug Use: Not on file  . Sexual Activity: Not on file   Other Topics Concern  . Not on file   Social History Narrative  . No narrative on file    Objective:  Filed Vitals:   02/19/13 1708  BP: 120/84  Pulse: 84  Temp: 97.8 F (36.6 C)  Resp: 16     General appearance: alert, cooperative and appears stated age Ears: normal TM's and external ear canals both ears Throat: lips, mucosa, and tongue normal; teeth and gums normal Neck: no adenopathy, no carotid bruit, supple, symmetrical, trachea midline and thyroid  not enlarged, symmetric, no tenderness/mass/nodules Back: symmetric, no curvature. ROM normal. No CVA tenderness. Lungs: clear to auscultation bilaterally Heart: regular rate and rhythm, S1, S2 normal, no murmur, click, rub or gallop Abdomen: soft, non-tender; bowel sounds normal; no masses,  no organomegaly Pulses: 2+ and symmetric Skin: Skin color, texture, turgor normal. No rashes or lesions Lymph nodes: Cervical, supraclavicular, and axillary nodes normal.  Assessment and Plan:  Chest pain This chest pain was atypical. He was admitted to [facility] and ruled out for acute MI and cardiac disease with noninvasive testing. His symptoms have resolved since being treated for esophageal spasm due to GERD. He has discontinued his PPI..,   if his pain returns I will recommend resuming a PPI that time evaluating his gallbladder for signs of  cholecystitis.  Diabetes mellitus type 2 in obese Well-controlled on current medications.  Tolerating metformin. hemoglobin A1c has been consistently less than 7.0 . He is up-to-date on eye exams and his foot exam is normal. l we'll repeat his urine microalbumin to creatinine ratio at next visit. He is on the appropriate medications.  Anxiety Significant stressors include his work load at [employer] ([employer]) and lack of sufficient support. Trial of buspirone and when necessary alprazolam.  Obesity (BMI 30.0-34.9) I have addressed  BMI and recommended wt loss of 10% of body weight over the next 6 months using a low glycemic index diet and regular exercise a minimum of 5 days per week. Given his wife is committed to a recumbent bike chairperson for home use.     A total of 25 minutes was spent with patient more than half of which was spent in counseling, reviewing records from other prviders and coordination of care.  Updated Medication List Outpatient Encounter Prescriptions as of 02/19/2013  Medication Sig  . FREESTYLE LITE test strip Use twice daily  . Lancets (FREESTYLE) lancets Use as directed  . levothyroxine (SYNTHROID, LEVOTHROID) 125 MCG tablet Take 125 mcg by mouth daily before breakfast.  . levothyroxine (SYNTHROID, LEVOTHROID) 137 MCG tablet Take 1 tablet (137 mcg total) by mouth daily.  Marland Kitchen losartan (COZAAR) 50 MG tablet Take 1 tablet (50 mg total) by mouth daily.  . metFORMIN (GLUCOPHAGE) 500 MG tablet Take 1 tablet (500 mg total) by mouth 2 (two) times daily with a meal.  . ALPRAZolam (XANAX) 1 MG tablet Take 1 tablet (1 mg total) by mouth at bedtime as needed for sleep or anxiety.  . busPIRone (BUSPAR) 7.5 MG tablet Take 1 tablet (7.5 mg total) by mouth 3 (three) times daily.  Marland Kitchen omeprazole (PRILOSEC) 20 MG capsule Take 20 mg by mouth daily.  . [DISCONTINUED] citalopram (CELEXA) 10 MG tablet Take 1 tablet (10 mg total) by mouth daily.     Orders Placed This Encounter  Procedures  . Lipid  panel  . Microalbumin / creatinine urine ratio  . Comprehensive metabolic panel    No Follow-up on file.

## 2013-02-20 ENCOUNTER — Encounter: Payer: Self-pay | Admitting: Internal Medicine

## 2013-02-20 NOTE — Assessment & Plan Note (Signed)
This chest pain was atypical. He was admitted to [facility] and ruled out for acute MI and cardiac disease with noninvasive testing. His symptoms have resolved since being treated for esophageal spasm due to GERD. He has discontinued his PPI..,   if his pain returns I will recommend resuming a PPI that time evaluating his gallbladder for signs of cholecystitis.

## 2013-02-20 NOTE — Assessment & Plan Note (Signed)
Well-controlled on current medications.  Tolerating metformin. hemoglobin A1c has been consistently less than 7.0 . He is up-to-date on eye exams and his foot exam is normal. l we'll repeat his urine microalbumin to creatinine ratio at next visit. He is on the appropriate medications.

## 2013-02-20 NOTE — Addendum Note (Signed)
Addended by: Crecencio Mc on: 02/20/2013 12:58 PM   Modules accepted: Level of Service

## 2013-02-20 NOTE — Assessment & Plan Note (Signed)
Significant stressors include his work load at [employer] and lack of sufficient support. Trial of buspirone and when necessary alprazolam.

## 2013-02-20 NOTE — Assessment & Plan Note (Signed)
I have addressed  BMI and recommended wt loss of 10% of body weight over the next 6 months using a low glycemic index diet and regular exercise a minimum of 5 days per week. Given his wife is committed to a recumbent bike chairperson for home use.

## 2013-02-21 ENCOUNTER — Telehealth: Payer: Self-pay

## 2013-02-21 NOTE — Telephone Encounter (Signed)
Relevant patient education assigned to patient using Emmi. ° °

## 2013-02-22 NOTE — Telephone Encounter (Signed)
Please advise. Okay to fill?

## 2013-02-23 MED ORDER — BUSPIRONE HCL 7.5 MG PO TABS
7.5000 mg | ORAL_TABLET | Freq: Three times a day (TID) | ORAL | Status: DC
Start: 2013-02-23 — End: 2013-12-24

## 2013-02-23 MED ORDER — ALPRAZOLAM 1 MG PO TABS: 1.0000 mg | ORAL_TABLET | Freq: Every evening | ORAL | Status: AC | PRN

## 2013-02-26 NOTE — Telephone Encounter (Signed)
Faxed to pharmacy

## 2013-03-06 ENCOUNTER — Other Ambulatory Visit: Payer: Self-pay | Admitting: Internal Medicine

## 2013-03-12 NOTE — Addendum Note (Signed)
Addended by: Wynonia Lawman E on: 03/12/2013 07:52 AM   Modules accepted: Orders

## 2013-03-22 ENCOUNTER — Encounter: Payer: Self-pay | Admitting: Internal Medicine

## 2013-03-22 MED ORDER — GUAIFENESIN-CODEINE 100-10 MG/5ML PO SYRP: 5.0000 mL | ORAL_SOLUTION | Freq: Three times a day (TID) | ORAL | Status: DC | PRN

## 2013-03-22 NOTE — Telephone Encounter (Signed)
Rx faxed to pharmacy  

## 2013-03-30 ENCOUNTER — Encounter: Payer: Self-pay | Admitting: Internal Medicine

## 2013-03-30 ENCOUNTER — Ambulatory Visit (INDEPENDENT_AMBULATORY_CARE_PROVIDER_SITE_OTHER): Payer: 59 | Admitting: Internal Medicine

## 2013-03-30 ENCOUNTER — Telehealth: Payer: Self-pay | Admitting: Internal Medicine

## 2013-03-30 VITALS — BP 140/80 | HR 98 | Temp 99.6°F | Resp 18 | Wt 323.5 lb

## 2013-03-30 DIAGNOSIS — J020 Streptococcal pharyngitis: Secondary | ICD-10-CM

## 2013-03-30 DIAGNOSIS — R509 Fever, unspecified: Secondary | ICD-10-CM

## 2013-03-30 DIAGNOSIS — H6690 Otitis media, unspecified, unspecified ear: Secondary | ICD-10-CM

## 2013-03-30 LAB — POC INFLUENZA A&B (BINAX/QUICKVUE)
Influenza A, POC: NEGATIVE
Influenza B, POC: NEGATIVE

## 2013-03-30 LAB — POCT RAPID STREP A (OFFICE): Rapid Strep A Screen: NEGATIVE

## 2013-03-30 MED ORDER — FLORAJEN ACIDOPHILUS PO CAPS: ORAL_CAPSULE | ORAL | Status: DC

## 2013-03-30 MED ORDER — AMOXICILLIN-POT CLAVULANATE 875-125 MG PO TABS: 1.0000 | ORAL_TABLET | Freq: Two times a day (BID) | ORAL | Status: DC

## 2013-03-30 NOTE — Progress Notes (Signed)
Pre-visit discussion using our clinic review tool. No additional management support is needed unless otherwise documented below in the visit note.  

## 2013-03-30 NOTE — Progress Notes (Signed)
Patient ID: Steven Hoover, male   DOB: 1970-07-25, 43 y.o.   MRN: [id number]   Patient Active Problem List   Diagnosis Date Noted  . Otitis media 04/01/2013  . Chest pain 02/05/2013  . Essential hypertension, benign 12/29/2012  . Other abnormal blood chemistry 12/29/2012  . Routine general medical examination at a health care facility 07/18/2012  . OSA on CPAP 07/17/2012  . Hypothyroidism, iatrogenic 11/10/2011  . Hernia, umbilical 41/32/4401  . Diabetes mellitus type 2 in obese 05/04/2011  . Elevated LFTs 04/15/2011  . Back pain, lumbosacral 03/17/2011  . Obesity (BMI 30.0-34.9) 03/16/2011  . Anxiety 03/16/2011    Subjective:  CC:   Chief Complaint  Patient presents with  . Sore Throat    chills , aching started wednesday the 25  . Otalgia    HPI:   Steven Hoover is a 43 y.o. male who presents with severe throat pain. One week history of cough, sinus congestion and clear nasal drainage, which improved steadily with OTC meds until 3 nights ago.  developed sore throat and ear pain with have been increasing in severity and now accompanied by subjective fevers, body aches and chills.  Family members are not sick and he denies recent travel or use of public transportation.    Past Medical History  Diagnosis Date  . OSA on CPAP 2011    on auto VPAP, sleeping well humidied 02  . Thyroid disease   . Hypertension   . Back pain   . Sinusitis   . Cancer     thyroid    Past Surgical History  Procedure Laterality Date  . Total thyroidectomy  2004  . Adnoidectomy         The following portions of the patient's history were reviewed and updated as appropriate: Allergies, current medications, and problem list.    Review of Systems:   Patient denies headache, fevers, malaise, unintentional weight loss, skin rash, eye pain, sinus congestion and sinus pain, sore throat, dysphagia,  hemoptysis , cough, dyspnea, wheezing, chest pain, palpitations, orthopnea, edema,  abdominal pain, nausea, melena, diarrhea, constipation, flank pain, dysuria, hematuria, urinary  Frequency, nocturia, numbness, tingling, seizures,  Focal weakness, Loss of consciousness,  Tremor, insomnia, depression, anxiety, and suicidal ideation.     History   Social History  . Marital Status: Married    Spouse Name: N/A    Number of Children: N/A  . Years of Education: N/A   Occupational History  . Not on file.   Social History Main Topics  . Smoking status: Former Smoker    Quit date: 08/02/2010  . Smokeless tobacco: Never Used  . Alcohol Use: 3.0 oz/week    5 Glasses of wine per week  . Drug Use: Not on file  . Sexual Activity: Not on file   Other Topics Concern  . Not on file   Social History Narrative  . No narrative on file    Objective:  Filed Vitals:   03/30/13 1427  BP: 140/80  Pulse: 98  Temp: 99.6 F (37.6 C)  Resp: 18     General appearance: alert, cooperative and appears stated age Ears: erythematous right TM,  No bulgign.  Left TM normal.Throat: lips, mucosa, and tongue normal. Bilateral tonsillar erythema and edema with early exudative ulcerations Neck: bilateral cervical lymphadenopathy, no carotid bruit, supple, symmetrical, trachea midline and thyroid not enlarged, symmetric, no tenderness/mass/nodules Back: symmetric, no curvature. ROM normal. No CVA tenderness. Lungs: clear to auscultation bilaterally Heart: regular  rate and rhythm, S1, S2 normal, no murmur, click, rub or gallop Abdomen: soft, non-tender; bowel sounds normal; no masses,  no organomegaly Pulses: 2+ and symmetric Skin: Skin color, texture, turgor normal. No rashes or lesions Lymph nodes: Cervical, supraclavicular, and axillary nodes normal.  Assessment and Plan:  Otitis media Accompanied by severe pharyngitis.  Strep and flu rapid testing were negative.  Treatment with augmentin advised based on severity of exam. Work note given.  Probiotic use encouraged   Updated  Medication List Outpatient Encounter Prescriptions as of 03/30/2013  Medication Sig  . busPIRone (BUSPAR) 7.5 MG tablet Take 1 tablet (7.5 mg total) by mouth 3 (three) times daily.  Marland Kitchen FREESTYLE LITE test strip Use twice daily  . guaiFENesin-codeine (ROBITUSSIN AC) 100-10 MG/5ML syrup Take 5 mLs by mouth 3 (three) times daily as needed for cough.  . Lancets (FREESTYLE) lancets Use as directed  . levothyroxine (SYNTHROID, LEVOTHROID) 112 MCG tablet Take 1 tablet (112 mcg total) by mouth daily. Take with 125 mcg  . levothyroxine (SYNTHROID, LEVOTHROID) 125 MCG tablet Take 125 mcg by mouth daily before breakfast. Take with 112 mcg  . losartan (COZAAR) 50 MG tablet Take 1 tablet (50 mg total) by mouth daily.  . metFORMIN (GLUCOPHAGE) 500 MG tablet Take 1 tablet (500 mg total) by mouth 2 (two) times daily with a meal.  . omeprazole (PRILOSEC) 20 MG capsule Take 20 mg by mouth daily.  Marland Kitchen amoxicillin-clavulanate (AUGMENTIN) 875-125 MG per tablet Take 1 tablet by mouth 2 (two) times daily.  . Lactobacillus (FLORAJEN ACIDOPHILUS) CAPS 1 tablet twice daily for 10 days  While on antibiotics.  pharmacy may choose any available alternative     Orders Placed This Encounter  Procedures  . POC Influenza A&B (Binax test)  . POCT rapid strep A    No Follow-up on file.

## 2013-03-30 NOTE — Telephone Encounter (Signed)
Please put patient in this afternoon for an acute visit and get a rapid strep test and a flu test

## 2013-03-30 NOTE — Patient Instructions (Signed)
I am treating you for Strep throat and otitis media (even tho your test was negative) based on your exam and history with Augmentin twice daiy for 7 to 10 days  Please take a probiotic while you are on the antibiotic to prevent a serious antibiotic associated diarrhea  Called clostridium dificile colitis   Otitis Media, Adult Otitis media is redness, soreness, and swelling (inflammation) of the middle ear. Otitis media may be caused by allergies or, most commonly, by infection. Often it occurs as a complication of the common cold. SIGNS AND SYMPTOMS Symptoms of otitis media may include:  Earache.  Fever.  Ringing in your ear.  Headache.  Leakage of fluid from the ear. DIAGNOSIS To diagnose otitis media, your health care provider will examine your ear with an otoscope. This is an instrument that allows your health care provider to see into your ear in order to examine your eardrum. Your health care provider also will ask you questions about your symptoms. TREATMENT  Typically, otitis media resolves on its own within 3 5 days. Your health care provider may prescribe medicine to ease your symptoms of pain. If otitis media does not resolve within 5 days or is recurrent, your health care provider may prescribe antibiotic medicines if he or she suspects that a bacterial infection is the cause. HOME CARE INSTRUCTIONS   Take your medicine as directed until it is gone, even if you feel better after the first few days.  Only take over-the-counter or prescription medicines for pain, discomfort, or fever as directed by your health care provider.  Follow up with your health care provider as directed. SEEK MEDICAL CARE IF:  You have otitis media only in one ear or bleeding from your nose or both.  You notice a lump on your neck.  You are not getting better in 3 5 days.  You feel worse instead of better. SEEK IMMEDIATE MEDICAL CARE IF:   You have pain that is not controlled with  medicine.  You have swelling, redness, or pain around your ear or stiffness in your neck.  You notice that part of your face is paralyzed.  You notice that the bone behind your ear (mastoid) is tender when you touch it. MAKE SURE YOU:   Understand these instructions.  Will watch your condition.  Will get help right away if you are not doing well or get worse. Document Released: 10/24/2003 Document Revised: 11/08/2012 Document Reviewed: 08/15/2012 [facility] Patient Information 2014 [place], [place].  Strep Throat Strep throat is an infection of the throat caused by a bacteria named Streptococcus pyogenes. Your caregiver may call the infection streptococcal "tonsillitis" or "pharyngitis" depending on whether there are signs of inflammation in the tonsils or back of the throat. Strep throat is most common in children aged 5 15 years during the cold months of the year, but it can occur in people of any age during any season. This infection is spread from person to person (contagious) through coughing, sneezing, or other close contact. SYMPTOMS   Fever or chills.  Painful, swollen, red tonsils or throat.  Pain or difficulty when swallowing.  White or yellow spots on the tonsils or throat.  Swollen, tender lymph nodes or "glands" of the neck or under the jaw.  Red rash all over the body (rare). DIAGNOSIS  Many different infections can cause the same symptoms. A test must be done to confirm the diagnosis so the right treatment can be given. A "rapid strep test" can help your  caregiver make the diagnosis in a few minutes. If this test is not available, a light swab of the infected area can be used for a throat culture test. If a throat culture test is done, results are usually available in a day or two. TREATMENT  Strep throat is treated with antibiotic medicine. HOME CARE INSTRUCTIONS   Gargle with 1 tsp of salt in 1 cup of warm water, 3 4 times per day or as needed for  comfort.  Family members who also have a sore throat or fever should be tested for strep throat and treated with antibiotics if they have the strep infection.  Make sure everyone in your household washes their hands well.  Do not share food, drinking cups, or personal items that could cause the infection to spread to others.  You may need to eat a soft food diet until your sore throat gets better.  Drink enough water and fluids to keep your urine clear or pale yellow. This will help prevent dehydration.  Get plenty of rest.  Stay home from school, daycare, or work until you have been on antibiotics for 24 hours.  Only take over-the-counter or prescription medicines for pain, discomfort, or fever as directed by your caregiver.  If antibiotics are prescribed, take them as directed. Finish them even if you start to feel better. SEEK MEDICAL CARE IF:   The glands in your neck continue to enlarge.  You develop a rash, cough, or earache.  You cough up green, yellow-brown, or bloody sputum.  You have pain or discomfort not controlled by medicines.  Your problems seem to be getting worse rather than better. SEEK IMMEDIATE MEDICAL CARE IF:   You develop any new symptoms such as vomiting, severe headache, stiff or painful neck, chest pain, shortness of breath, or trouble swallowing.  You develop severe throat pain, drooling, or changes in your voice.  You develop swelling of the neck, or the skin on the neck becomes red and tender.  You have a fever.  You develop signs of dehydration, such as fatigue, dry mouth, and decreased urination.  You become increasingly sleepy, or you cannot wake up completely. Document Released: 01/16/2000 Document Revised: 01/05/2012 Document Reviewed: 03/19/2010 [facility] Patient Information 2014 [place], [place].

## 2013-03-30 NOTE — Telephone Encounter (Signed)
Patient be here at  2.15

## 2013-04-01 ENCOUNTER — Encounter: Payer: Self-pay | Admitting: Internal Medicine

## 2013-04-01 DIAGNOSIS — H6690 Otitis media, unspecified, unspecified ear: Secondary | ICD-10-CM | POA: Insufficient documentation

## 2013-04-01 NOTE — Assessment & Plan Note (Addendum)
Accompanied by severe pharyngitis.  Strep and flu rapid testing were negative.  Treatment with augmentin advised based on severity of exam. Work note given.  Probiotic use encouraged

## 2013-04-09 ENCOUNTER — Other Ambulatory Visit: Payer: Self-pay | Admitting: Internal Medicine

## 2013-04-16 ENCOUNTER — Encounter: Payer: Self-pay | Admitting: Internal Medicine

## 2013-04-16 ENCOUNTER — Other Ambulatory Visit: Payer: Self-pay | Admitting: Internal Medicine

## 2013-04-16 MED ORDER — LEVOTHYROXINE SODIUM 112 MCG PO TABS: ORAL_TABLET | ORAL | Status: DC

## 2013-04-17 MED ORDER — LEVOTHYROXINE SODIUM 125 MCG PO TABS: 125.0000 ug | ORAL_TABLET | Freq: Every day | ORAL | Status: DC

## 2013-05-08 ENCOUNTER — Encounter: Payer: Self-pay | Admitting: Internal Medicine

## 2013-05-09 ENCOUNTER — Other Ambulatory Visit: Payer: Self-pay | Admitting: *Deleted

## 2013-05-09 DIAGNOSIS — I10 Essential (primary) hypertension: Secondary | ICD-10-CM

## 2013-05-09 MED ORDER — LOSARTAN POTASSIUM 50 MG PO TABS: 50.0000 mg | ORAL_TABLET | Freq: Every day | ORAL | Status: DC

## 2013-05-29 ENCOUNTER — Other Ambulatory Visit (INDEPENDENT_AMBULATORY_CARE_PROVIDER_SITE_OTHER): Payer: 59

## 2013-05-29 DIAGNOSIS — E119 Type 2 diabetes mellitus without complications: Secondary | ICD-10-CM

## 2013-05-29 DIAGNOSIS — R7989 Other specified abnormal findings of blood chemistry: Secondary | ICD-10-CM

## 2013-05-29 LAB — COMPREHENSIVE METABOLIC PANEL
ALT: 81 U/L — ABNORMAL HIGH (ref 0–53)
AST: 52 U/L — ABNORMAL HIGH (ref 0–37)
Albumin: 4.2 g/dL (ref 3.5–5.2)
Alkaline Phosphatase: 52 U/L (ref 39–117)
BUN: 11 mg/dL (ref 6–23)
CO2: 27 mEq/L (ref 19–32)
Calcium: 9.1 mg/dL (ref 8.4–10.5)
Chloride: 103 mEq/L (ref 96–112)
Creatinine, Ser: 0.9 mg/dL (ref 0.4–1.5)
GFR: 101.91 mL/min (ref >60.00)
Glucose, Bld: 154 mg/dL — ABNORMAL HIGH (ref 70–99)
Potassium: 4.2 mEq/L (ref 3.5–5.1)
Sodium: 137 mEq/L (ref 135–145)
Total Bilirubin: 0.6 mg/dL (ref 0.3–1.2)
Total Protein: 7.7 g/dL (ref 6.0–8.3)

## 2013-05-29 LAB — FERRITIN: Ferritin: 117.1 ng/mL (ref 22.0–322.0)

## 2013-05-29 LAB — MICROALBUMIN / CREATININE URINE RATIO
Creatinine,U: 138.6 mg/dL
Microalb Creat Ratio: 0.8 mg/g (ref 0.0–30.0)
Microalb, Ur: 1.1 mg/dL (ref 0.0–1.9)

## 2013-05-29 LAB — IRON AND TIBC
%SAT: 29 % (ref 20–55)
Iron: 98 ug/dL (ref 42–165)
TIBC: 340 ug/dL (ref 215–435)
UIBC: 242 ug/dL (ref 125–400)

## 2013-05-29 LAB — LIPID PANEL
Cholesterol: 102 mg/dL (ref 0–200)
HDL: 43.5 mg/dL (ref >39.00)
LDL Cholesterol: 45 mg/dL (ref 0–99)
Total CHOL/HDL Ratio: 2
Triglycerides: 68 mg/dL (ref 0.0–149.0)
VLDL: 13.6 mg/dL (ref 0.0–40.0)

## 2013-05-30 ENCOUNTER — Encounter: Payer: Self-pay | Admitting: Internal Medicine

## 2013-05-30 ENCOUNTER — Ambulatory Visit (INDEPENDENT_AMBULATORY_CARE_PROVIDER_SITE_OTHER): Payer: 59 | Admitting: Internal Medicine

## 2013-05-30 VITALS — BP 122/90 | HR 95 | Temp 98.3°F | Resp 18 | Wt 318.5 lb

## 2013-05-30 DIAGNOSIS — E039 Hypothyroidism, unspecified: Secondary | ICD-10-CM

## 2013-05-30 DIAGNOSIS — E032 Hypothyroidism due to medicaments and other exogenous substances: Secondary | ICD-10-CM

## 2013-05-30 DIAGNOSIS — E669 Obesity, unspecified: Secondary | ICD-10-CM

## 2013-05-30 DIAGNOSIS — I10 Essential (primary) hypertension: Secondary | ICD-10-CM

## 2013-05-30 DIAGNOSIS — E119 Type 2 diabetes mellitus without complications: Secondary | ICD-10-CM

## 2013-05-30 DIAGNOSIS — E1169 Type 2 diabetes mellitus with other specified complication: Secondary | ICD-10-CM

## 2013-05-30 LAB — HEPATITIS B SURFACE ANTIGEN: Hepatitis B Surface Ag: NEGATIVE

## 2013-05-30 LAB — ANTI-SMITH ANTIBODY: ENA SM Ab Ser-aCnc: <1

## 2013-05-30 LAB — HEPATITIS B SURFACE ANTIBODY,QUALITATIVE: Hep B S Ab: NEGATIVE

## 2013-05-30 LAB — HEPATITIS B CORE ANTIBODY, TOTAL: Hep B Core Total Ab: NONREACTIVE

## 2013-05-30 LAB — HEPATITIS C ANTIBODY: HCV Ab: NEGATIVE

## 2013-05-30 LAB — ANA: Anti Nuclear Antibody(ANA): NEGATIVE

## 2013-05-30 MED ORDER — LOSARTAN POTASSIUM-HCTZ 50-12.5 MG PO TABS: 1.0000 | ORAL_TABLET | Freq: Every day | ORAL | Status: DC

## 2013-05-30 MED ORDER — GLIPIZIDE 5 MG PO TABS: 5.0000 mg | ORAL_TABLET | Freq: Two times a day (BID) | ORAL | Status: DC

## 2013-05-30 MED ORDER — FREESTYLE LANCETS MISC: Status: DC

## 2013-05-30 MED ORDER — FLUTICASONE PROPIONATE 50 MCG/ACT NA SUSP: 2.0000 | Freq: Every day | NASAL | Status: DC

## 2013-05-30 MED ORDER — FEXOFENADINE HCL 180 MG PO TABS: 180.0000 mg | ORAL_TABLET | Freq: Every day | ORAL | Status: DC

## 2013-05-30 NOTE — Patient Instructions (Addendum)
We are adding glipizide 5 mg twice daily before breakfast and dinner (30 minutes or less)  We will increase to 10 mg if BS are still >  150 post prandially  by the end of the  Month   Changing losartan to losartan/hct  For additional bp control  Consider the following snacks  For low carb   pork rinds Sweet peppers with Stan's WASA crackers   W/ goat cheeses  Dannon Lt n Fit greek yogurt Jello sugar free puddings   Allegra 180 mg daily for allergies,  And resume  flonase

## 2013-05-30 NOTE — Progress Notes (Signed)
Patient ID: Steven Hoover, male   DOB: 1970/08/07, 43 y.o.   MRN: [id number]   Patient Active Problem List   Diagnosis Date Noted  . Otitis media 04/01/2013  . Chest pain 02/05/2013  . Essential hypertension, benign 12/29/2012  . Other abnormal blood chemistry 12/29/2012  . Routine general medical examination at a health care facility 07/18/2012  . OSA on CPAP 07/17/2012  . Hypothyroidism, iatrogenic 11/10/2011  . Hernia, umbilical 73/53/2992  . Diabetes mellitus type 2 in obese 05/04/2011  . Elevated LFTs 04/15/2011  . Back pain, lumbosacral 03/17/2011  . Obesity (BMI 30.0-34.9) 03/16/2011  . Anxiety 03/16/2011    Subjective:  CC:   Chief Complaint  Patient presents with  . Follow-up  . Diabetes    HPI:   Steven Hoover is a 43 y.o. male who presents for Follow up on DM Type 2, fatty liver,  Obesity with OSA, and hypothyroidism.   He has been checking his blood sugars once daily and reports that his fastings are 130 to 140 and his post prandials have ranged from  180 to 200.  He is taking metformin 500 mg bid .       Past Medical History  Diagnosis Date  . OSA on CPAP 2011    on auto VPAP, sleeping well humidied 02  . Thyroid disease   . Hypertension   . Back pain   . Sinusitis   . Cancer     thyroid    Past Surgical History  Procedure Laterality Date  . Total thyroidectomy  2004  . Adnoidectomy         The following portions of the patient's history were reviewed and updated as appropriate: Allergies, current medications, and problem list.    Review of Systems:   Patient denies headache, fevers, malaise, unintentional weight loss, skin rash, eye pain, sinus congestion and sinus pain, sore throat, dysphagia,  hemoptysis , cough, dyspnea, wheezing, chest pain, palpitations, orthopnea, edema, abdominal pain, nausea, melena, diarrhea, constipation, flank pain, dysuria, hematuria, urinary  Frequency, nocturia, numbness, tingling, seizures,  Focal  weakness, Loss of consciousness,  Tremor, insomnia, depression, anxiety, and suicidal ideation.     History   Social History  . Marital Status: Married    Spouse Name: N/A    Number of Children: N/A  . Years of Education: N/A   Occupational History  . Not on file.   Social History Main Topics  . Smoking status: Former Smoker    Quit date: 08/02/2010  . Smokeless tobacco: Never Used  . Alcohol Use: 3.0 oz/week    5 Glasses of wine per week  . Drug Use: Not on file  . Sexual Activity: Not on file   Other Topics Concern  . Not on file   Social History Narrative  . No narrative on file    Objective:  Filed Vitals:   05/30/13 1605  BP: 122/90  Pulse: 95  Temp: 98.3 F (36.8 C)  Resp: 18     General appearance: alert, cooperative and appears stated age Ears: normal TM's and external ear canals both ears Throat: lips, mucosa, and tongue normal; teeth and gums normal Neck: no adenopathy, no carotid bruit, supple, symmetrical, trachea midline and thyroid not enlarged, symmetric, no tenderness/mass/nodules Back: symmetric, no curvature. ROM normal. No CVA tenderness. Lungs: clear to auscultation bilaterally Heart: regular rate and rhythm, S1, S2 normal, no murmur, click, rub or gallop Abdomen: soft, non-tender; bowel sounds normal; no masses,  no organomegaly Pulses:  2+ and symmetric Skin: Skin color, texture, turgor normal. No rashes or lesions Lymph nodes: Cervical, supraclavicular, and axillary nodes normal.  Assessment and Plan:  Essential hypertension, benign Not well controlled on current regimen..  Adding hctz.   Diabetes mellitus type 2 in obese Well-controlled on metformin,  Adding glipizide 2.5 mg bid for post prandial elevations.  He is up-to-date on eye exams and his foot exam is normal.  He is on the appropriate medications.  Lab Results  Component Value Date   HGBA1C 6.7* 05/30/2013   Lab Results  Component Value Date   MICROALBUR 1.1 05/29/2013    Lab Results  Component Value Date   CHOL 102 05/29/2013   HDL 43.50 05/29/2013   LDLCALC 45 05/29/2013   LDLDIRECT 53.6 12/26/2012   TRIG 68.0 05/29/2013   CHOLHDL 2 05/29/2013    Hypothyroidism, iatrogenic Secondary to thyroidectomy.  TSh is < 1.0   Lab Results  Component Value Date   TSH 0.62 05/30/2013      Updated Medication List Outpatient Encounter Prescriptions as of 05/30/2013  Medication Sig  . busPIRone (BUSPAR) 7.5 MG tablet Take 1 tablet (7.5 mg total) by mouth 3 (three) times daily.  Marland Kitchen FREESTYLE LITE test strip Use twice daily  . Lactobacillus (FLORAJEN ACIDOPHILUS) CAPS 1 tablet twice daily for 10 days  While on antibiotics.  pharmacy may choose any available alternative  . Lancets (FREESTYLE) lancets Use as directed  . levothyroxine (SYNTHROID, LEVOTHROID) 112 MCG tablet Take 1 tablet (112 mcg total) by mouth daily. Take with 125 mcg  . levothyroxine (SYNTHROID, LEVOTHROID) 125 MCG tablet Take 1 tablet (125 mcg total) by mouth daily before breakfast. Take with 112 mcg  . metFORMIN (GLUCOPHAGE) 500 MG tablet Take 1 tablet (500 mg total) by mouth 2 (two) times daily with a meal.  . [DISCONTINUED] Lancets (FREESTYLE) lancets Use as directed  . [DISCONTINUED] losartan (COZAAR) 50 MG tablet Take 1 tablet (50 mg total) by mouth daily. MUST KEEP APPT FOR FURTHER REFILLS  . fexofenadine (ALLEGRA) 180 MG tablet Take 1 tablet (180 mg total) by mouth daily.  . fluticasone (FLONASE) 50 MCG/ACT nasal spray Place 2 sprays into both nostrils daily.  Marland Kitchen glipiZIDE (GLUCOTROL) 5 MG tablet Take 1 tablet (5 mg total) by mouth 2 (two) times daily before a meal.  . losartan-hydrochlorothiazide (HYZAAR) 50-12.5 MG per tablet Take 1 tablet by mouth daily.  Marland Kitchen omeprazole (PRILOSEC) 20 MG capsule Take 20 mg by mouth daily.  . [DISCONTINUED] amoxicillin-clavulanate (AUGMENTIN) 875-125 MG per tablet Take 1 tablet by mouth 2 (two) times daily.  . [DISCONTINUED] guaiFENesin-codeine (ROBITUSSIN AC)  100-10 MG/5ML syrup Take 5 mLs by mouth 3 (three) times daily as needed for cough.     Orders Placed This Encounter  Procedures  . Hemoglobin A1c  . TSH    No Follow-up on file.

## 2013-05-30 NOTE — Progress Notes (Signed)
Pre-visit discussion using our clinic review tool. No additional management support is needed unless otherwise documented below in the visit note.  

## 2013-05-31 ENCOUNTER — Encounter: Payer: Self-pay | Admitting: Internal Medicine

## 2013-05-31 LAB — HEMOGLOBIN A1C: Hgb A1c MFr Bld: 6.7 % — ABNORMAL HIGH (ref 4.6–6.5)

## 2013-05-31 LAB — TSH: TSH: 0.62 u[IU]/mL (ref 0.35–5.50)

## 2013-06-02 ENCOUNTER — Encounter: Payer: Self-pay | Admitting: Internal Medicine

## 2013-06-02 NOTE — Assessment & Plan Note (Signed)
Secondary to thyroidectomy.  TSh is < 1.0   Lab Results  Component Value Date   TSH 0.62 05/30/2013

## 2013-06-02 NOTE — Assessment & Plan Note (Addendum)
Well-controlled on metformin,  Adding glipizide 2.5 mg bid for post prandial elevations.  He is up-to-date on eye exams and his foot exam is normal.  He is on the appropriate medications.  Lab Results  Component Value Date   HGBA1C 6.7* 05/30/2013   Lab Results  Component Value Date   MICROALBUR 1.1 05/29/2013   Lab Results  Component Value Date   CHOL 102 05/29/2013   HDL 43.50 05/29/2013   LDLCALC 45 05/29/2013   LDLDIRECT 53.6 12/26/2012   TRIG 68.0 05/29/2013   CHOLHDL 2 05/29/2013

## 2013-06-02 NOTE — Assessment & Plan Note (Signed)
Not well controlled on current regimen..  Adding hctz.

## 2013-08-30 ENCOUNTER — Encounter: Payer: Self-pay | Admitting: *Deleted

## 2013-09-03 NOTE — Telephone Encounter (Signed)
Mailed unread message to pt  

## 2013-09-06 LAB — HM DIABETES EYE EXAM

## 2013-11-05 ENCOUNTER — Encounter: Payer: Self-pay | Admitting: Internal Medicine

## 2013-11-05 ENCOUNTER — Other Ambulatory Visit: Payer: Self-pay | Admitting: *Deleted

## 2013-11-05 MED ORDER — GLUCOSE BLOOD VI STRP: ORAL_STRIP | Status: DC

## 2013-11-05 MED ORDER — FREESTYLE FREEDOM LITE W/DEVICE KIT: PACK | Status: DC

## 2013-11-05 MED ORDER — FREESTYLE LANCETS MISC: Status: DC

## 2013-12-07 ENCOUNTER — Encounter: Payer: Self-pay | Admitting: Internal Medicine

## 2013-12-07 ENCOUNTER — Ambulatory Visit (INDEPENDENT_AMBULATORY_CARE_PROVIDER_SITE_OTHER): Payer: 59 | Admitting: Internal Medicine

## 2013-12-07 VITALS — BP 122/88 | HR 83 | Temp 98.6°F | Resp 16 | Ht 74.0 in | Wt 320.5 lb

## 2013-12-07 DIAGNOSIS — R945 Abnormal results of liver function studies: Secondary | ICD-10-CM

## 2013-12-07 DIAGNOSIS — Z9989 Dependence on other enabling machines and devices: Secondary | ICD-10-CM

## 2013-12-07 DIAGNOSIS — R7989 Other specified abnormal findings of blood chemistry: Secondary | ICD-10-CM

## 2013-12-07 DIAGNOSIS — E119 Type 2 diabetes mellitus without complications: Secondary | ICD-10-CM

## 2013-12-07 DIAGNOSIS — E032 Hypothyroidism due to medicaments and other exogenous substances: Secondary | ICD-10-CM

## 2013-12-07 DIAGNOSIS — I10 Essential (primary) hypertension: Secondary | ICD-10-CM

## 2013-12-07 DIAGNOSIS — E669 Obesity, unspecified: Secondary | ICD-10-CM

## 2013-12-07 DIAGNOSIS — E1169 Type 2 diabetes mellitus with other specified complication: Secondary | ICD-10-CM

## 2013-12-07 DIAGNOSIS — Z23 Encounter for immunization: Secondary | ICD-10-CM

## 2013-12-07 DIAGNOSIS — G4733 Obstructive sleep apnea (adult) (pediatric): Secondary | ICD-10-CM

## 2013-12-07 LAB — COMPREHENSIVE METABOLIC PANEL
ALT: 136 U/L — ABNORMAL HIGH (ref 0–53)
AST: 94 U/L — ABNORMAL HIGH (ref 0–37)
Albumin: 3.8 g/dL (ref 3.5–5.2)
Alkaline Phosphatase: 60 U/L (ref 39–117)
BUN: 12 mg/dL (ref 6–23)
CO2: 22 mEq/L (ref 19–32)
Calcium: 9.1 mg/dL (ref 8.4–10.5)
Chloride: 100 mEq/L (ref 96–112)
Creatinine, Ser: 1 mg/dL (ref 0.4–1.5)
GFR: 87.58 mL/min (ref >60.00)
Glucose, Bld: 312 mg/dL — ABNORMAL HIGH (ref 70–99)
Potassium: 4.2 mEq/L (ref 3.5–5.1)
Sodium: 135 mEq/L (ref 135–145)
Total Bilirubin: 1.5 mg/dL — ABNORMAL HIGH (ref 0.2–1.2)
Total Protein: 7.8 g/dL (ref 6.0–8.3)

## 2013-12-07 LAB — MICROALBUMIN / CREATININE URINE RATIO
Creatinine,U: 98.1 mg/dL
Microalb Creat Ratio: 1.4 mg/g (ref 0.0–30.0)
Microalb, Ur: 1.4 mg/dL (ref 0.0–1.9)

## 2013-12-07 LAB — TSH: TSH: 5.28 u[IU]/mL — ABNORMAL HIGH (ref 0.35–4.50)

## 2013-12-07 LAB — LIPID PANEL
Cholesterol: 113 mg/dL (ref 0–200)
HDL: 41.1 mg/dL (ref >39.00)
LDL Cholesterol: 61 mg/dL (ref 0–99)
NonHDL: 71.9
Total CHOL/HDL Ratio: 3
Triglycerides: 57 mg/dL (ref 0.0–149.0)
VLDL: 11.4 mg/dL (ref 0.0–40.0)

## 2013-12-07 LAB — HM DIABETES FOOT EXAM: HM Diabetic Foot Exam: NORMAL

## 2013-12-07 LAB — HEMOGLOBIN A1C: Hgb A1c MFr Bld: 11.2 % — ABNORMAL HIGH (ref 4.6–6.5)

## 2013-12-07 MED ORDER — METFORMIN HCL 850 MG PO TABS: 850.0000 mg | ORAL_TABLET | Freq: Two times a day (BID) | ORAL | Status: DC

## 2013-12-07 MED ORDER — GLIPIZIDE 10 MG PO TABS: 10.0000 mg | ORAL_TABLET | Freq: Two times a day (BID) | ORAL | Status: DC

## 2013-12-07 NOTE — Progress Notes (Signed)
Per MD order patient received teaching on insulin administration using demonstration with insulin syringe and sterile water, Patient was able to demonstrate back to nurse with the appropriate technique. Patient voiced understanding of direction for insulin to send message after 2 weeks if CBG = more than 200.

## 2013-12-07 NOTE — Patient Instructions (Addendum)
I am increasing your glipizide to 10 mg twice daily  And your metformin to 750 mg twice daily  If your fastings are still > 200 in 2 weeks,  I want you to start using NPH insulin 15 units once daily in the evening before bedtime  The "Pure Protein"  shakes are available  at  [facility] [place]   30 g protein,  160 cal,  4 net carbs    7 AM Breakfast:  Choose from the following:  Low carbohydrate Protein  Shakes (I recommend the EAS AdvantEdge "Carb Control" shakes  Or the low carb shakes by Atkins.    2.5 carbs   Arnold's "Sandwhich Thin"toasted  w/ peanut butter (no jelly: about 20 net carbs  "Bagel Thin" with cream cheese and salmon: about 20 carbs   a scrambled egg/bacon/cheese burrito made with Mission's "carb balance" whole wheat tortilla  (about 10 net carbs )  A slice of home made fritatta (egg based dish without a crust:  google it)    Avoid cereal and bananas, oatmeal and cream of wheat and grits. They are loaded with carbohydrates!   10 AM: high protein snack  Protein bar by Atkins (the snack size, under 200 cal, usually < 6 net carbs).    A stick of cheese:  Around 1 carb,  100 cal     Dannon Light n Fit [place] Yogurt  (80 cal, 8 carbs)  Other so called "protein bars" and Greek yogurts tend to be loaded with carbohydrates.  Remember, in food advertising, the word "energy" is synonymous for " carbohydrate."  Lunch:   A Sandwich using the bread choices listed, Can use any  Eggs,  lunchmeat, grilled meat or canned tuna), avocado, regular mayo/mustard  and cheese.  A Salad using blue cheese, ranch,  Goddess or vinagrette,  No croutons or "confetti" and no "candied nuts" but regular nuts OK.   No pretzels or chips.  Pickles and miniature sweet peppers are a good low carb alternative that provide a "crunch"  The bread is the only source of carbohydrate in a sandwich and  can be decreased by trying some of these alternatives to traditional loaf bread  Joseph's makes a pita bread and a flat bread  that are 50 cal and 4 net carbs available at [place] and [place].  This can be toasted to use with hummous as well  Toufayan makes a low carb flatbread that's 100 cal and 9 net carbs available at [employer] and [employer]J's makes 2 sizes of  Low carb whole wheat tortilla  (The large one is 210 cal and 6 net carbs)  Flat Out makes flatbreads that are low carb as well  Avoid "Low fat dressings, as well as Barry Brunner and Pelzer dressings They are loaded with sugar!   3 PM/ Mid day  Snack:  Consider  1 ounce of  almonds, walnuts, pistachios, pecans, peanuts,  Macadamia nuts or a nut medley.  Avoid "granola"; the dried cranberries and raisins are loaded with carbohydrates. Mixed nuts as long as there are no raisins,  cranberries or dried fruit.    Try the prosciutto/mozzarella cheese sticks by Fiorruci  In deli /backery section   High protein   To avoid overindulging in snacks: Try drinking a glass of unsweeted almond/coconut milk  Or a cup of coffee with your Atkins chocolate bar to keep you from having 3!!!   Pork rinds!  Yes Pork Rinds        6  PM  Dinner:     Meat/fowl/fish with a green salad, and either broccoli, cauliflower, green beans, spinach, brussel sprouts or  Lima beans. DO NOT BREAD THE PROTEIN!!      There is a low carb pasta by Dreamfield's that is acceptable and tastes great: only 5 digestible carbs/serving.( All grocery stores but BJs carry it )  Try Hurley Cisco Angelo's chicken piccata or chicken or eggplant parm over low carb pasta.(Lowes and BJs)   Marjory Lies Sanchez's "Carnitas" (pulled pork, no sauce,  0 carbs) or his beef pot roast to make a dinner burrito (at BJ's)  Pesto over low carb pasta (bj's sells a good quality pesto in the center refrigerated section of the deli   Try satueeing  Cheral Marker with mushroooms  Whole wheat pasta is still full of digestible carbs and  Not as low in glycemic index as Dreamfield's.   Brown rice is still rice,  So skip the rice and noodles if you  eat [place] or [place] (or at least limit to 1/2 cup)  9 PM snack :   Breyer's "low carb" fudgsicle or  ice cream bar (Carb Smart line), or  Weight Watcher's ice cream bar , or another "no sugar added" ice cream;  a serving of fresh berries/cherries with whipped cream   Cheese or DANNON'S LlGHT N FIT GREEK YOGURT or the [place] greek yogurt   8 ounces of [place] unsweetened almond/cococunut milk  Cheese and crackers (using WASA crackers,  They are low carb) or peanut butter on low carb crackers or pita bread     Avoid bananas, pineapple, grapes  and watermelon on a regular basis because they are high in sugar.  THINK OF THEM AS DESSERT  Remember that snack Substitutions should be less than 10 NET carbs per serving and meals should be < 25 net carbs. Remember that carbohydrates from fiber do not affect blood sugar, so you can  subtract fiber grams to get the "net carbs " of any particular food item.

## 2013-12-07 NOTE — Progress Notes (Signed)
Pre-visit discussion using our clinic review tool. No additional management support is needed unless otherwise documented below in the visit note.  

## 2013-12-07 NOTE — Progress Notes (Signed)
Patient ID: Steven Hoover, male   DOB: 05/02/1970, 43 y.o.   MRN: [id number]  Patient Active Problem List   Diagnosis Date Noted  . Chest pain 02/05/2013  . Essential hypertension, benign 12/29/2012  . Routine general medical examination at a health care facility 07/18/2012  . OSA on CPAP 07/17/2012  . Hypothyroidism, iatrogenic 11/10/2011  . Hernia, umbilical 09/22/2011  . Diabetes mellitus type 2 in obese 05/04/2011  . Elevated LFTs 04/15/2011  . Back pain, lumbosacral 03/17/2011  . Obesity (BMI 30.0-34.9) 03/16/2011  . Anxiety 03/16/2011    Subjective:  CC:   Chief Complaint  Patient presents with  . Follow-up  . Diabetes    HPI:   Steven Hoover is a 43 y.o. male who presents for  Follow up on type 2 DM ,  Obesity, hypertension, and hypothyroidiim.  He has been struggling to maintain the weight loss he achieved of 15 lbs through diet and exercise.  He reports that he initially lost 15 lbs,  To 305  Using exercise and diet,  Only to gain some back after getting sick in mid September with sinusitis requiring augmetnin and prednisone.   His sugars have been 250 to 280 currently and he is not exercising or sticking to a diet. To his work schedule.   Discussed ncreasing glip tp 10 mg bid and metformin to 750 bid,  May need to add insulin   Past Medical History  Diagnosis Date  . OSA on CPAP 2011    on auto VPAP, sleeping well humidied 02  . Thyroid disease   . Hypertension   . Back pain   . Sinusitis   . Cancer     thyroid    Past Surgical History  Procedure Laterality Date  . Total thyroidectomy  2004  . Adnoidectomy         The following portions of the patient's history were reviewed and updated as appropriate: Allergies, current medications, and problem list.    Review of Systems:   Patient denies headache, fevers, malaise, unintentional weight loss, skin rash, eye pain, sinus congestion and sinus pain, sore throat, dysphagia,  hemoptysis ,  cough, dyspnea, wheezing, chest pain, palpitations, orthopnea, edema, abdominal pain, nausea, melena, diarrhea, constipation, flank pain, dysuria, hematuria, urinary  Frequency, nocturia, numbness, tingling, seizures,  Focal weakness, Loss of consciousness,  Tremor, insomnia, depression, anxiety, and suicidal ideation.     History   Social History  . Marital Status: Married    Spouse Name: N/A    Number of Children: N/A  . Years of Education: N/A   Occupational History  . Not on file.   Social History Main Topics  . Smoking status: Former Smoker    Quit date: 08/02/2010  . Smokeless tobacco: Never Used  . Alcohol Use: 3.0 oz/week    5 Glasses of wine per week  . Drug Use: Not on file  . Sexual Activity: Not on file   Other Topics Concern  . Not on file   Social History Narrative    Objective:  Filed Vitals:   12/07/13 0804  BP: 122/88  Pulse: 83  Temp: 98.6 F (37 C)  Resp: 16     General appearance: alert, cooperative and appears stated age Ears: normal TM's and external ear canals both ears Throat: lips, mucosa, and tongue normal; teeth and gums normal Neck: no adenopathy, no carotid bruit, supple, symmetrical, trachea midline and thyroid not enlarged, symmetric, no tenderness/mass/nodules Back: symmetric, no curvature. ROM normal.  No CVA tenderness. Lungs: clear to auscultation bilaterally Heart: regular rate and rhythm, S1, S2 normal, no murmur, click, rub or gallop Abdomen: soft, non-tender; bowel sounds normal; no masses,  no organomegaly Pulses: 2+ and symmetric Skin: Skin color, texture, turgor normal. No rashes or lesions Lymph nodes: Cervical, supraclavicular, and axillary nodes normal.  Assessment and Plan:  Essential hypertension, benign Home bps have been normal.  No changes today.  Lab Results  Component Value Date   CREATININE 1.0 12/07/2013   Lab Results  Component Value Date   NA 135 12/07/2013   K 4.2 12/07/2013   CL 100 12/07/2013    CO2 22 12/07/2013     OSA on CPAP Diagnosed by sleep study. he is wearing her CPAP every night a minimum of 6 hours per night and notes improved daytime wakefulness and decreased fatigue    Elevated LFTs Workup for causes has not included ultrasound yet.  Serologies for autoimmune etiologies were negative.  Fatty liver suspected.   Lab Results  Component Value Date   ALT 136* 12/07/2013   AST 94* 12/07/2013   ALKPHOS 60 12/07/2013   BILITOT 1.5* 12/07/2013     Diabetes mellitus type 2 in obese Not Well-controlled on metformin and glipizide5 mg bid,  wil increase both and add bedtime NPH if fasting sugars are not < 150 in 2 weeks. He is up-to-date on eye exams and his foot exam is normal.  He is on the appropriate medications.  Lab Results  Component Value Date   HGBA1C 11.2* 12/07/2013   Lab Results  Component Value Date   MICROALBUR 1.4 12/07/2013   Lab Results  Component Value Date   CHOL 113 12/07/2013   HDL 41.10 12/07/2013   LDLCALC 61 12/07/2013   LDLDIRECT 53.6 12/26/2012   TRIG 57.0 12/07/2013   CHOLHDL 3 12/07/2013      Obesity (BMI 30.0-34.9) I have addressed  BMI and recommended a low glycemic index diet utilizing smaller more frequent meals to increase metabolism.  I have also recommended that patient start exercising with a goal of 30 minutes of aerobic exercise a minimum of 5 days per week.    Updated Medication List Outpatient Encounter Prescriptions as of 12/07/2013  Medication Sig  . Blood Glucose Monitoring Suppl (FREESTYLE FREEDOM LITE) W/DEVICE KIT Use to check sugars twice a day  . busPIRone (BUSPAR) 7.5 MG tablet Take 1 tablet (7.5 mg total) by mouth 3 (three) times daily.  Marland Kitchen FREESTYLE LITE test strip Use twice daily  . glucose blood (FREESTYLE TEST STRIPS) test strip Use as instructed  . Lactobacillus (FLORAJEN ACIDOPHILUS) CAPS 1 tablet twice daily for 10 days  While on antibiotics.  pharmacy may choose any available alternative  .  Lancets (FREESTYLE) lancets Use as directed  . Lancets (FREESTYLE) lancets Use as instructed  . levothyroxine (SYNTHROID, LEVOTHROID) 112 MCG tablet Take 1 tablet (112 mcg total) by mouth daily. Take with 125 mcg  . levothyroxine (SYNTHROID, LEVOTHROID) 125 MCG tablet Take 1 tablet (125 mcg total) by mouth daily before breakfast. Take with 112 mcg  . losartan-hydrochlorothiazide (HYZAAR) 50-12.5 MG per tablet Take 1 tablet by mouth daily.  . metFORMIN (GLUCOPHAGE) 850 MG tablet Take 1 tablet (850 mg total) by mouth 2 (two) times daily with a meal.  . [DISCONTINUED] metFORMIN (GLUCOPHAGE) 500 MG tablet Take 1 tablet (500 mg total) by mouth 2 (two) times daily with a meal.  . glipiZIDE (GLUCOTROL) 10 MG tablet Take 1 tablet (10 mg total) by  mouth 2 (two) times daily before a meal.  . omeprazole (PRILOSEC) 20 MG capsule Take 20 mg by mouth daily.  . [DISCONTINUED] fexofenadine (ALLEGRA) 180 MG tablet Take 1 tablet (180 mg total) by mouth daily.  . [DISCONTINUED] fluticasone (FLONASE) 50 MCG/ACT nasal spray Place 2 sprays into both nostrils daily.  . [DISCONTINUED] glipiZIDE (GLUCOTROL) 5 MG tablet Take 1 tablet (5 mg total) by mouth 2 (two) times daily before a meal.     Orders Placed This Encounter  Procedures  . Pneumococcal polysaccharide vaccine 23-valent greater than or equal to 2yo subcutaneous/IM  . Lipid panel  . Microalbumin / creatinine urine ratio  . Hemoglobin A1c  . Comprehensive metabolic panel  . TSH  . Microalbumin / creatinine urine ratio  . HM DIABETES EYE EXAM  . HM DIABETES FOOT EXAM    Return in about 3 months (around 03/09/2014) for follow up diabetes.

## 2013-12-09 ENCOUNTER — Encounter: Payer: Self-pay | Admitting: Internal Medicine

## 2013-12-09 DIAGNOSIS — E890 Postprocedural hypothyroidism: Secondary | ICD-10-CM

## 2013-12-09 DIAGNOSIS — R748 Abnormal levels of other serum enzymes: Secondary | ICD-10-CM

## 2013-12-09 DIAGNOSIS — E032 Hypothyroidism due to medicaments and other exogenous substances: Secondary | ICD-10-CM

## 2013-12-09 NOTE — Assessment & Plan Note (Signed)
Secondary to surgical resection for thyroid CA.  TSH is not suppressed.  Will confirm use of medications before adjusting.,   Lab Results  Component Value Date   TSH 5.28* 12/07/2013

## 2013-12-09 NOTE — Assessment & Plan Note (Signed)
Home bps have been normal.  No changes today.  Lab Results  Component Value Date   CREATININE 1.0 12/07/2013   Lab Results  Component Value Date   NA 135 12/07/2013   K 4.2 12/07/2013   CL 100 12/07/2013   CO2 22 12/07/2013

## 2013-12-09 NOTE — Assessment & Plan Note (Signed)
I have addressed  BMI and recommended a low glycemic index diet utilizing smaller more frequent meals to increase metabolism.  I have also recommended that patient start exercising with a goal of 30 minutes of aerobic exercise a minimum of 5 days per week.  

## 2013-12-09 NOTE — Assessment & Plan Note (Addendum)
Workup for causes has not included ultrasound yet.  Serologies for autoimmune etiologies were negative.  Fatty liver suspected.   Lab Results  Component Value Date   ALT 136* 12/07/2013   AST 94* 12/07/2013   ALKPHOS 60 12/07/2013   BILITOT 1.5* 12/07/2013

## 2013-12-09 NOTE — Assessment & Plan Note (Signed)
Not Well-controlled on metformin and glipizide5 mg bid,  wil increase both and add bedtime NPH if fasting sugars are not < 150 in 2 weeks. He is up-to-date on eye exams and his foot exam is normal.  He is on the appropriate medications.  Lab Results  Component Value Date   HGBA1C 11.2* 12/07/2013   Lab Results  Component Value Date   MICROALBUR 1.4 12/07/2013   Lab Results  Component Value Date   CHOL 113 12/07/2013   HDL 41.10 12/07/2013   LDLCALC 61 12/07/2013   LDLDIRECT 53.6 12/26/2012   TRIG 57.0 12/07/2013   CHOLHDL 3 12/07/2013

## 2013-12-09 NOTE — Assessment & Plan Note (Signed)
Diagnosed by sleep study. he is wearing her CPAP every night a minimum of 6 hours per night and notes improved daytime wakefulness and decreased fatigue  

## 2013-12-10 MED ORDER — LEVOTHYROXINE SODIUM 125 MCG PO TABS: 250.0000 ug | ORAL_TABLET | Freq: Every day | ORAL | Status: DC

## 2013-12-18 ENCOUNTER — Ambulatory Visit: Payer: Self-pay | Admitting: Internal Medicine

## 2013-12-20 ENCOUNTER — Telehealth: Payer: Self-pay | Admitting: Internal Medicine

## 2013-12-20 DIAGNOSIS — R7989 Other specified abnormal findings of blood chemistry: Secondary | ICD-10-CM

## 2013-12-20 DIAGNOSIS — R945 Abnormal results of liver function studies: Principal | ICD-10-CM

## 2013-12-22 LAB — HM DIABETES EYE EXAM

## 2013-12-24 ENCOUNTER — Other Ambulatory Visit: Payer: Self-pay | Admitting: Internal Medicine

## 2014-01-10 ENCOUNTER — Encounter: Payer: Self-pay | Admitting: Internal Medicine

## 2014-01-16 ENCOUNTER — Encounter: Payer: Self-pay | Admitting: Internal Medicine

## 2014-01-22 MED ORDER — SITAGLIPTIN PHOSPHATE 100 MG PO TABS: 100.0000 mg | ORAL_TABLET | Freq: Every day | ORAL | Status: DC

## 2014-04-22 ENCOUNTER — Encounter: Payer: Self-pay | Admitting: Internal Medicine

## 2014-04-22 ENCOUNTER — Ambulatory Visit (INDEPENDENT_AMBULATORY_CARE_PROVIDER_SITE_OTHER): Payer: 59 | Admitting: Internal Medicine

## 2014-04-22 VITALS — BP 134/88 | HR 96 | Temp 98.6°F | Resp 16 | Ht 74.0 in | Wt 329.5 lb

## 2014-04-22 DIAGNOSIS — E1165 Type 2 diabetes mellitus with hyperglycemia: Secondary | ICD-10-CM

## 2014-04-22 DIAGNOSIS — IMO0002 Reserved for concepts with insufficient information to code with codable children: Secondary | ICD-10-CM

## 2014-04-22 DIAGNOSIS — Z6841 Body Mass Index (BMI) 40.0 and over, adult: Secondary | ICD-10-CM

## 2014-04-22 DIAGNOSIS — K760 Fatty (change of) liver, not elsewhere classified: Secondary | ICD-10-CM

## 2014-04-22 DIAGNOSIS — E890 Postprocedural hypothyroidism: Secondary | ICD-10-CM

## 2014-04-22 MED ORDER — INSULIN NPH (HUMAN) (ISOPHANE) 100 UNIT/ML ~~LOC~~ SUSP: 10.0000 [IU] | Freq: Every day | SUBCUTANEOUS | Status: DC

## 2014-04-22 NOTE — Patient Instructions (Addendum)
Stop the glipizide for now..  It is likely the cause of your low blood sugars  Continue the metformin and januvia  I am recommending that you start a long acting insulin at bedtime ,NPH insulin 10 units at bedtime , to get yoru fasting sugars < 120 and over 80  We can adjust the dose every 2 weeks via MyChart if needed

## 2014-04-22 NOTE — Progress Notes (Signed)
Pre-visit discussion using our clinic review tool. No additional management support is needed unless otherwise documented below in the visit note.  

## 2014-04-22 NOTE — Progress Notes (Signed)
Patient ID: Steven Hoover, male   DOB: 09/14/1970, 44 y.o.   MRN: [id number]  Patient Active Problem List   Diagnosis Date Noted  . Chest pain 02/05/2013  . Essential hypertension, benign 12/29/2012  . Routine general medical examination at a health care facility 07/18/2012  . OSA on CPAP 07/17/2012  . Hypothyroidism, iatrogenic 11/10/2011  . Hernia, umbilical 33/82/5053  . Type II diabetes mellitus, uncontrolled 05/04/2011  . Fatty liver disease, nonalcoholic 97/67/3419  . Back pain, lumbosacral 03/17/2011  . Morbid obesity with BMI of 40.0-44.9, adult 03/16/2011  . Anxiety 03/16/2011    Subjective:  CC:   Chief Complaint  Patient presents with  . Follow-up  . Diabetes    HPI:   Steven Hoover is a 44 y.o. male who presents for   Follow up on Type 2 DM uncontrolled,  Obesity and hypertension .  He was last seen in November at which time a1c was alarmingly high at 11.2 and Janjuvia was added to regimen.  He was asked to submit blood sugars every few weeks, and to resume regular exercise and a low glycemic index diet.  He did not submit any sugars and has gained weight.  He reports today that sugars running 120 to 150, but is having mid afternoon lows to the 40's.   He is not skipping meals,  And taking glipizide 10 mg twice daily and januvia once daiily as well. .  Lab Results  Component Value Date   HGBA1C 11.2* 12/07/2013   Wt Readings from Last 3 Encounters:  04/22/14 329 lb 8 oz (149.46 kg)  12/07/13 320 lb 8 oz (145.378 kg)  05/30/13 318 lb 8 oz (144.471 kg)     Past Medical History  Diagnosis Date  . OSA on CPAP 2011    on auto VPAP, sleeping well humidied 02  . Thyroid disease   . Hypertension   . Back pain   . Sinusitis   . Cancer     thyroid    Past Surgical History  Procedure Laterality Date  . Total thyroidectomy  2004  . Adnoidectomy         The following portions of the patient's history were reviewed and updated as appropriate:  Allergies, current medications, and problem list.    Review of Systems:   Patient denies headache, fevers, malaise, unintentional weight loss, skin rash, eye pain, sinus congestion and sinus pain, sore throat, dysphagia,  hemoptysis , cough, dyspnea, wheezing, chest pain, palpitations, orthopnea, edema, abdominal pain, nausea, melena, diarrhea, constipation, flank pain, dysuria, hematuria, urinary  Frequency, nocturia, numbness, tingling, seizures,  Focal weakness, Loss of consciousness,  Tremor, insomnia, depression, anxiety, and suicidal ideation.     History   Social History  . Marital Status: Married    Spouse Name: N/A  . Number of Children: N/A  . Years of Education: N/A   Occupational History  . Not on file.   Social History Main Topics  . Smoking status: Former Smoker    Quit date: 08/02/2010  . Smokeless tobacco: Never Used  . Alcohol Use: 3.0 oz/week    5 Glasses of wine per week  . Drug Use: Not on file  . Sexual Activity: Not on file   Other Topics Concern  . Not on file   Social History Narrative    Objective:  Filed Vitals:   04/22/14 1558  BP: 134/88  Pulse: 96  Temp: 98.6 F (37 C)  Resp: 16     General  appearance: alert, cooperative and appears stated age Ears: normal TM's and external ear canals both ears Throat: lips, mucosa, and tongue normal; teeth and gums normal Neck: no adenopathy, no carotid bruit, supple, symmetrical, trachea midline and thyroid not enlarged, symmetric, no tenderness/mass/nodules Back: symmetric, no curvature. ROM normal. No CVA tenderness. Lungs: clear to auscultation bilaterally Heart: regular rate and rhythm, S1, S2 normal, no murmur, click, rub or gallop Abdomen: soft, non-tender; bowel sounds normal; no masses,  no organomegaly Pulses: 2+ and symmetric Skin: Skin color, texture, turgor normal. No rashes or lesions Lymph nodes: Cervical, supraclavicular, and axillary nodes normal.  Assessment and  Plan:  Problem List Items Addressed This Visit      Unprioritized   Type II diabetes mellitus, uncontrolled - Primary    Advised to stop the glipizide, continue januvia, metformin and start NPH insulin in the evening starting with 10 units.  Will need to submit BS every 2 weeks for dose adjustment.  A1c and UA for protein  is pending.   Lab Results  Component Value Date   HGBA1C 11.2* 12/07/2013         Relevant Medications   insulin NPH Human (HUMULIN N) 100 UNIT/ML injection   Other Relevant Orders   Comprehensive metabolic panel   Hemoglobin A1c   Lipid panel   Microalbumin / creatinine urine ratio   Morbid obesity with BMI of 40.0-44.9, adult    Reviewed his previous success at weight loss,  His goal weight for BMI < 40  Is 307 lbs, and for BMI < 30, 230 lbs.  Will recommend referral to bariatric center since he has been unable to achieve goals over the past 3 years with low glycemic index diet and regular exercise a minimum of 5 days per week.        Relevant Medications   insulin NPH Human (HUMULIN N) 100 UNIT/ML injection   Fatty liver disease, nonalcoholic    Workup for causes has  included ultrasound which suggested fatty liver,  And negative serologies for autoimmune etiologies . Current liver enzymes are pending  and all modifiable risk factors including obesity, diabetes and hyperlipidemia have been addressed   Lab Results  Component Value Date   ALT 136* 12/07/2013   AST 94* 12/07/2013   ALKPHOS 60 12/07/2013   BILITOT 1.5* 12/07/2013   Lab Results  Component Value Date   CHOL 113 12/07/2013   HDL 41.10 12/07/2013   LDLCALC 61 12/07/2013   LDLDIRECT 53.6 12/26/2012   TRIG 57.0 12/07/2013   CHOLHDL 3 12/07/2013            Other Visit Diagnoses    Postoperative hypothyroidism

## 2014-04-23 ENCOUNTER — Encounter: Payer: Self-pay | Admitting: Internal Medicine

## 2014-04-23 LAB — COMPREHENSIVE METABOLIC PANEL
ALT: 71 U/L — ABNORMAL HIGH (ref 0–53)
AST: 42 U/L — ABNORMAL HIGH (ref 0–37)
Albumin: 4 g/dL (ref 3.5–5.2)
Alkaline Phosphatase: 51 U/L (ref 39–117)
BUN: 9 mg/dL (ref 6–23)
CO2: 29 mEq/L (ref 19–32)
Calcium: 9.1 mg/dL (ref 8.4–10.5)
Chloride: 102 mEq/L (ref 96–112)
Creatinine, Ser: 0.92 mg/dL (ref 0.40–1.50)
GFR: 95.15 mL/min (ref >60.00)
Glucose, Bld: 139 mg/dL — ABNORMAL HIGH (ref 70–99)
Potassium: 4.1 mEq/L (ref 3.5–5.1)
Sodium: 136 mEq/L (ref 135–145)
Total Bilirubin: 0.4 mg/dL (ref 0.2–1.2)
Total Protein: 7.2 g/dL (ref 6.0–8.3)

## 2014-04-23 LAB — T4 AND TSH
T4, Total: 10.8 ug/dL (ref 4.5–12.0)
TSH: 0.793 u[IU]/mL (ref 0.450–4.500)

## 2014-04-23 LAB — MICROALBUMIN / CREATININE URINE RATIO
Creatinine,U: 84.5 mg/dL
Microalb Creat Ratio: 0.8 mg/g (ref 0.0–30.0)
Microalb, Ur: 0.7 mg/dL (ref 0.0–1.9)

## 2014-04-23 LAB — LIPID PANEL
Cholesterol: 103 mg/dL (ref 0–200)
HDL: 47 mg/dL (ref >39.00)
LDL Cholesterol: 40 mg/dL (ref 0–99)
NonHDL: 56
Total CHOL/HDL Ratio: 2
Triglycerides: 80 mg/dL (ref 0.0–149.0)
VLDL: 16 mg/dL (ref 0.0–40.0)

## 2014-04-23 LAB — HEMOGLOBIN A1C: Hgb A1c MFr Bld: 6.6 % — ABNORMAL HIGH (ref 4.6–6.5)

## 2014-04-23 NOTE — Assessment & Plan Note (Addendum)
Reviewed his previous success at weight loss,  His goal weight for BMI < 40  Is 307 lbs, and for BMI < 30, 230 lbs.  Will recommend referral to bariatric center since he has been unable to achieve goals over the past 3 years with low glycemic index diet and regular exercise a minimum of 5 days per week.

## 2014-04-23 NOTE — Assessment & Plan Note (Signed)
Workup for causes has  included ultrasound which suggested fatty liver,  And negative serologies for autoimmune etiologies . Current liver enzymes are pending  and all modifiable risk factors including obesity, diabetes and hyperlipidemia have been addressed   Lab Results  Component Value Date   ALT 136* 12/07/2013   AST 94* 12/07/2013   ALKPHOS 60 12/07/2013   BILITOT 1.5* 12/07/2013   Lab Results  Component Value Date   CHOL 113 12/07/2013   HDL 41.10 12/07/2013   LDLCALC 61 12/07/2013   LDLDIRECT 53.6 12/26/2012   TRIG 57.0 12/07/2013   CHOLHDL 3 12/07/2013

## 2014-04-23 NOTE — Assessment & Plan Note (Signed)
Advised to stop the glipizide, continue januvia, metformin and start NPH insulin in the evening starting with 10 units.  Will need to submit BS every 2 weeks for dose adjustment.  A1c and UA for protein  is pending.   Lab Results  Component Value Date   HGBA1C 11.2* 12/07/2013

## 2014-04-24 ENCOUNTER — Encounter: Payer: Self-pay | Admitting: Internal Medicine

## 2014-05-23 ENCOUNTER — Encounter: Payer: Self-pay | Admitting: Internal Medicine

## 2014-05-23 DIAGNOSIS — K625 Hemorrhage of anus and rectum: Secondary | ICD-10-CM

## 2014-05-24 NOTE — Discharge Summary (Signed)
PATIENT NAME:  Steven Hoover, BEDEL MR#:  [id number] DATE OF BIRTH:  April 12, 1970  DATE OF ADMISSION:  01/16/2013 DATE OF DISCHARGE:  01/17/2013  DISCHARGE DIAGNOSIS:  Chest pain, likely noncardiac with negative serial cardiac enzymes and Myoview, could be due to silent acid reflux.   SECONDARY DIAGNOSES: 1.  Diabetes.  2.  History of thyroid cancer, status post thyroidectomy.  3.  Hypertension.   CONSULTATIONS:  None.   PROCEDURES AND RADIOLOGY:  1.  Chest x-ray on 16th of December showed no acute cardiopulmonary disease.  2.  Myoview on 17th of December was within normal limits.  EF of 75%.  No significant ischemia.  3.  A 2-D echocardiogram on 17th of December showed normal LV systolic function with EF of 60% to 65%.  Essentially a normal study.   HISTORY AND SHORT HOSPITAL COURSE:  The patient is a 44 year old male with above-mentioned medical problems was admitted for chest pain.  Please see Dr. Evie Lacks dictated history and physical for further details.  The patient was ruled out with 3 negative sets of cardiac enzymes, had a normal TSH.  He underwent Myoview and echocardiogram which were both within normal limits and was chest pain-free.  His chest pain was thought to be possibly secondary to silent acid reflux and he was prescribed trial off PPI and was discharged home on 17th of December in stable condition.  On the date of discharge, his vital signs are as follows:  Temperature 97.6, heart rate 93 per minute, respirations 18 per minute, blood pressure 124/86 mmHg.  He was saturating 93% on room air.   PERTINENT PHYSICAL EXAMINATION ON THE DATE OF DISCHARGE:  CARDIOVASCULAR:  S1, S2 normal.  No murmurs, rubs, or gallop.  LUNGS:  Clear to auscultation bilaterally.  No wheezing, rales, rhonchi, no crepitation.  ABDOMEN:  Soft, benign.  NEUROLOGIC:  Nonfocal examination.  All other physical examination remained at baseline.   DISCHARGE MEDICATIONS: 1.  Losartan 50 mg by mouth daily.  2.   Levothyroxine 237 mcg by mouth daily.  3.  Metformin 500 mg by mouth twice daily.  4.  Protonix 40 mg by mouth at bedtime.   DISCHARGE DIET:  1800 ADA.   DISCHARGE ACTIVITY:  As tolerated.   DISCHARGE INSTRUCTIONS AND FOLLOW-UP:  The patient was instructed to follow up with his primary care physician, Dr. Deborra Medina, in 1 to 2 weeks.  He will need follow-up with Dr. Ida Rogue with [facility] in 2 to 4 weeks, try proton pump inhibitor medication for at least two weeks to see if he feels better symptomatically.   Total time discharging this patient was 40 minutes.    ____________________________ Lucina Mellow. Manuella Ghazi, MD vss:ea D: 01/17/2013 22:29:52 ET T: 01/18/2013 01:59:17 ET JOB#: [id number]  cc: Shoni Quijas S. Manuella Ghazi, MD, <Dictator> Minna Merritts, MD Deborra Medina, MD Lucina Mellow [facility] MD ELECTRONICALLY SIGNED 01/22/2013 7:55

## 2014-05-25 NOTE — H&P (Signed)
PATIENT NAME:  Steven Hoover, Steven Hoover MR#:  [id number] DATE OF BIRTH:  25-Nov-1970  DATE OF ADMISSION:  01/16/2013  REFERRING PHYSICIAN: Lennette Bihari A. Paduchowski, MD   PRIMARY CARE PHYSICIAN:  Deborra Medina, MD  CHIEF COMPLAINT: Chest pain.   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 He has diabetes, hypertension and hypothyroidism. He was at work and about 10:45 started to become nauseous, diaphoretic, some headache. He then went on to develop some chest pain. He had a bout of emesis. He had off and on nausea, did vomit once. He went [facility] where they checked the vitals and then he was referred here. While in the ER, he has a negative troponin. He was given an aspirin as well as 2 nitroglycerin sublingual. The pain subsided and resolved after the nitroglycerin. Hospitalist services were contacted for further evaluation and management. He has no active chest pains.   PAST MEDICAL HISTORY: Hypertension, diabetes, thyroid cancer, status post thyroidectomy, currently on thyroid replacement therapy.   SOCIAL HISTORY: No tobacco. He does drink some alcohol. No drug use. 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   FAMILY HISTORY: Diabetes running in the family but no premature CAD.    ALLERGIES: No known drug allergies.   OUTPATIENT MEDICATIONS: Losartan 50 mg daily, levothyroxine 237 mcg once a day, metformin 500 mg 2 times a day. This was recently started about a week ago.   REVIEW OF SYSTEMS: CONSTITUTIONAL: No fever, fatigue, weight changes.  EYES: No blurry vision or double vision while he had chest pain. No visual changes.  ENT: No tinnitus, hearing loss or epistaxis.  RESPIRATORY: He does have some shortness of breath on exertion at times but no shortness of breath now, nor any shortness of breath while he had the pain in the chest. No wheezing, cough, painful respirations.  CARDIOVASCULAR: Chest pain as above, which  has resolved. No swelling in the legs or palpitations. No history of MI. No syncope. Has high blood pressure.  GASTROINTESTINAL: Nausea and vomiting as above, which has resolved. No abdominal pain. No hematemesis, bloody stools or dark stools.  GENITOURINARY: Denies dysuria, hematuria.  HEMATOLOGY AND LYMPHATIC: Denies anemia or easy bruising.  SKIN: No rashes.  MUSCULOSKELETAL: Denies arthritis or gout.  NEUROLOGIC: Denies focal weakness or numbness.  PSYCHIATRIC: Has some increased anxiety recently and some stress from job.   PHYSICAL EXAMINATION: VITAL SIGNS: Temperature on arrival 98.2, pulse rate 85, respiratory rate 18, blood pressure 129/89, O2 sat 95% on room air.  GENERAL: The patient is an obese male sitting in bed, no obvious distress, talking in full sentences.  HEENT: Normocephalic, atraumatic. Pupils are equal. Extraocular muscles intact. Moist mucous membranes.  NECK: Supple. No cervical lymphadenopathy. The patient has a healed lower neck horizontal scar from his previous thyroidectomy.  CARDIOVASCULAR:  S1 and S2.  No significant murmurs, rubs or gallops.  LUNGS: Clear to auscultation without wheezing, rhonchi or rales.  ABDOMEN: Soft, nontender, nondistended. Positive bowel sounds in all quadrants.  EXTREMITIES: No significant lower extremity edema.  SKIN: No obvious rashes.  NEUROLOGIC: Cranial nerves II through XII grossly intact. Strength is 5 out of 5 in all extremities.  PSYCHIATRIC: Awake, alert, oriented x 3, pleasant, cooperative.   LABORATORY, DIAGNOSTIC AND RADIOLOGICAL DATA: Glucose 109, BUN 10, creatinine 0.8, sodium 136, potassium 3.9. Troponin negative x 1. White count 6.9, hemoglobin 15.8, platelets are 274. EKG normal sinus rhythm, rate is 78, no acute ST  elevations or depressions, there are some T wave inversions in lead 3 which is a little more pronounced than previous EKG from a few years ago but otherwise no acute changes. Chest x-ray, PA and lateral,  showing no acute cardiopulmonary disease.   ASSESSMENT AND PLAN: We have a 44 year old male with hypothyroidism after thyroid cancer, status post thyroidectomy, diabetes, hypertension, with acute onset chest pain with some typical symptoms resolved with nitroglycerin. With the patient's risk factors of obesity, hypertension, diabetes; would admit the patient for observation and ruling out acute coronary syndrome. Will have his troponins and CK-MB cycled and would admit him to telemetry with a remote monitor. We would start aspirin, check a lipid profile and start a statin as well as is also a diabetic and with multiple risk factors. Would start him on a nitroglycerin patch as well as some morphine p.r.n. and Zofran p.r.n. for his nausea. He is certainly at risk for developing premature coronary artery disease. If he does rule out for acute coronary syndrome, a stress test has been ordered for tomorrow, which he can go undergo. In the meantime, I would go ahead and hold his metformin and start him on sliding scale insulin and see what his hemoglobin A1c is. Would check a TSH as well, resume Synthroid at previous dosage. Would start him on Lovenox for deep vein thrombosis prophylaxis. CODE STATUS: The patient is a full code.   TOTAL TIME SPENT: Is 45 minutes.   ____________________________ Vivien Presto, MD sa:cs D: 01/16/2013 13:54:19 ET T: 01/16/2013 14:06:26 ET JOB#: [id number]  cc: Vivien Presto, MD, <Dictator> Vivien Presto MD ELECTRONICALLY SIGNED 02/13/2013 11:03

## 2014-05-27 NOTE — Telephone Encounter (Signed)
Left message for patient to return call to office. 

## 2014-06-05 ENCOUNTER — Other Ambulatory Visit: Payer: Self-pay

## 2014-06-05 VITALS — BP 122/84 | HR 86 | Ht 74.0 in | Wt 321.8 lb

## 2014-06-05 DIAGNOSIS — E119 Type 2 diabetes mellitus without complications: Secondary | ICD-10-CM

## 2014-06-05 NOTE — Patient Instructions (Addendum)
Diabetes and Exercise Exercising regularly is important. It is not just about losing weight. It has many health benefits, such as:  Improving your overall fitness, flexibility, and endurance.  Increasing your bone density.  Helping with weight control.  Decreasing your body fat.  Increasing your muscle strength.  Reducing stress and tension.  Improving your overall health. People with diabetes who exercise gain additional benefits because exercise:  Reduces appetite.  Improves the body's use of blood sugar (glucose).  Helps lower or control blood glucose.  Decreases blood pressure.  Helps control blood lipids (such as cholesterol and triglycerides).  Improves the body's use of the hormone insulin by:  Increasing the body's insulin sensitivity.  Reducing the body's insulin needs.  Decreases the risk for heart disease because exercising:  Lowers cholesterol and triglycerides levels.  Increases the levels of good cholesterol (such as high-density lipoproteins [HDL]) in the body.  Lowers blood glucose levels. YOUR ACTIVITY PLAN  Choose an activity that you enjoy and set realistic goals. Your health care provider or diabetes educator can help you make an activity plan that works for you. Exercise regularly as directed by your health care provider. This includes:  Performing resistance training twice a week such as push-ups, sit-ups, lifting weights, or using resistance bands.  Performing 150 minutes of cardio exercises each week such as walking, running, or playing sports.  Staying active and spending no more than 90 minutes at one time being inactive. Even short bursts of exercise are good for you. Three 10-minute sessions spread throughout the day are just as beneficial as a single 30-minute session. Some exercise ideas include:  Taking the dog for a walk.  Taking the stairs instead of the elevator.  Dancing to your favorite song.  Doing an exercise  video.  Doing your favorite exercise with a friend. RECOMMENDATIONS FOR EXERCISING WITH TYPE 1 OR TYPE 2 DIABETES   Check your blood glucose before exercising. If blood glucose levels are greater than 240 mg/dL, check for urine ketones. Do not exercise if ketones are present.  Avoid injecting insulin into areas of the body that are going to be exercised. For example, avoid injecting insulin into:  The arms when playing tennis.  The legs when jogging.  Keep a record of:  Food intake before and after you exercise.  Expected peak times of insulin action.  Blood glucose levels before and after you exercise.  The type and amount of exercise you have done.  Review your records with your health care provider. Your health care provider will help you to develop guidelines for adjusting food intake and insulin amounts before and after exercising.  If you take insulin or oral hypoglycemic agents, watch for signs and symptoms of hypoglycemia. They include:  Dizziness.  Shaking.  Sweating.  Chills.  Confusion.  Drink plenty of water while you exercise to prevent dehydration or heat stroke. Body water is lost during exercise and must be replaced.  Talk to your health care provider before starting an exercise program to make sure it is safe for you. Remember, almost any type of activity is better than none. Document Released: 04/10/2003 Document Revised: 06/04/2013 Document Reviewed: 06/27/2012 [employer] Patient Information 2015 [employer]. This information is not intended to replace advice given to you by your health care provider. Make sure you discuss any questions you have with your health care provider.  

## 2014-06-05 NOTE — Patient Outreach (Signed)
[place] [facility]) Care Management   06/05/2014  Steven Hoover 1970-05-24 [id number]  Steven Hoover is an 44 y.o. male.   Member seen for follow up office visit for Link to Wellness program for self management of Type 2 diabetes  Subjective: Member states that he saw MD in March and his hemoglobin A1C was down to 6.6.  States that the MD stopped his glipizide and started him on insulin at bedtime.  States that he is participating in the [place] study and he his blood sugars have been better since starting the program and taking insulin.  States he would like to get where he did not need to take the insulin.  States that he has not had any low readings since he stopped taking the glipizide.  States that his blood sugars have been higher on the mornings when he has eaten a snack the night before.  States that in the last two weeks he has been eating chips as his snack.  Objective:   Review of Systems  All other systems reviewed and are negative.   Physical Exam  Filed Vitals:   06/05/14 1116  BP: 122/84  Pulse: 86   Filed Weights   06/05/14 1116  Weight: 321 lb 12.8 oz (145.968 kg)     Current Medications:   Current Outpatient Prescriptions  Medication Sig Dispense Refill  . Blood Glucose Monitoring Suppl (FREESTYLE FREEDOM LITE) W/DEVICE KIT Use to check sugars twice a day 1 each 0  . FREESTYLE LITE test strip Use twice daily 100 each 5  . glucose blood (FREESTYLE TEST STRIPS) test strip Use as instructed 100 each 12  . insulin NPH Human (HUMULIN N) 100 UNIT/ML injection Inject 0.1 mLs (10 Units total) into the skin at bedtime. 10 mL 11  . Lancets (FREESTYLE) lancets Use as directed 100 each 12  . levothyroxine (SYNTHROID, LEVOTHROID) 125 MCG tablet Take 2 tablets (250 mcg total) by mouth daily before breakfast. Total daily dose 250 mcg 180 tablet 2  . metFORMIN (GLUCOPHAGE) 850 MG tablet Take 1 tablet (850 mg total) by mouth 2 (two) times daily with a meal.  180 tablet 1  . sitaGLIPtin (JANUVIA) 100 MG tablet Take 1 tablet (100 mg total) by mouth daily. 90 tablet 1  . busPIRone (BUSPAR) 7.5 MG tablet TAKE 1 TABLET BY MOUTH 3 TIMES DAILY (Patient not taking: Reported on 04/22/2014) 90 tablet 1  . Lactobacillus (FLORAJEN ACIDOPHILUS) CAPS 1 tablet twice daily for 10 days  While on antibiotics.  pharmacy may choose any available alternative (Patient not taking: Reported on 06/05/2014) 30 capsule 0  . Lancets (FREESTYLE) lancets Use as instructed 100 each 12  . losartan-hydrochlorothiazide (HYZAAR) 50-12.5 MG per tablet Take 1 tablet by mouth daily. 90 tablet 3  . omeprazole (PRILOSEC) 20 MG capsule Take 20 mg by mouth daily.     No current facility-administered medications for this visit.    Functional Status:   In your present state of health, do you have any difficulty performing the following activities: 06/05/2014  Hearing? N  Vision? N  Difficulty concentrating or making decisions? N  Walking or climbing stairs? N  Dressing or bathing? N  Doing errands, shopping? N    Fall/Depression Screening:    PHQ 2/9 Scores 06/05/2014  PHQ - 2 Score 0   THN CM Care Plan        Most Recent Value   Problem One    Care Plan Problem One  Potential for  elevated blood sugars related to dx of Type 2 DM   Role Documenting the Problem One  Care Management Coordinator   [facility] CM Care Plan Problem One     Care Plan for Problem One  Active   Patient Has Long Term Goal?  Yes   THN Long Term Goal (31-90 days)  Member will maintain blood sugars within goal ranges as evidenced by hemoglobin A1C of 7 or below   THN Long Term Goal Start Date  06/05/14   Interventions for Problem One Long Term Goal  Reviewed CHO counting and portion sizes, Discussed making wise choices when eating out, Reviewed blood sugar logs,  Instructed when he does eat PM snacks to  limit to 15 GM of CHO and  to include a protein with the snack,  Instructed to continue to partcipate in the [facility]  study,  Reinforced  to continue to  go to the gym at least 3 times a week,    Problem One Short Term Goals    Short Term Goal #1    Short Term Goal #2    Short Term Goal #3    Short Term Goal #4    Short Tern Goal #5    Problem Two    THN CM Care Plan Problem Two    Problem Two Short Term Goals    Short Term Goal #1    Short Term Goal #2    Short Term Goal #3    Short Term Goal #4    Short Term Goal #5    Problem Three    THN CM Care Plan Problem Three    Problem Three Short Term Goals    Short Term Goal #1    Short Term Goal #2    Short Term Goal #3    Short Term Goal #4    Short Term Goal #5     Communicated to [facility] nurse concerning member's progress with the program.  Assessment:  Member seen in the Link to Wellness program for self management of Type 2 DM, Member brought logs of blood sugars to appt, His bloods sugars have ranged 94-142 fasting and 80-174 post prandial Member is working with his MD about weight loss.  He has lost 7.6 lbs since last visit.  He reports he is going to gym 3 times a week for 45 minutes and he is walking in the evening with his son several days a week.      Plan:  Plan to continue to watch carbohydrates and eating a snack with protein and carbohydrates in evening Plan to check blood sugars 2-3 times a day with goal of 120 or less fasting and 180 or less after eating. Plan to go to gym 3-4 times a week for 60 minutes Plan to see MD in June  Plan to return to Link to Wellness on 09/04/14 at [place], [facility] Care Management Coordinator-Link to [place] Management [phone]

## 2014-06-12 ENCOUNTER — Other Ambulatory Visit: Payer: Self-pay | Admitting: Internal Medicine

## 2014-06-19 ENCOUNTER — Other Ambulatory Visit: Payer: Self-pay | Admitting: Gastroenterology

## 2014-06-19 DIAGNOSIS — K760 Fatty (change of) liver, not elsewhere classified: Secondary | ICD-10-CM

## 2014-06-19 DIAGNOSIS — K7581 Nonalcoholic steatohepatitis (NASH): Secondary | ICD-10-CM

## 2014-06-24 ENCOUNTER — Ambulatory Visit: Payer: 59

## 2014-06-27 ENCOUNTER — Encounter: Payer: Self-pay | Admitting: *Deleted

## 2014-06-28 ENCOUNTER — Ambulatory Visit
Admission: RE | Admit: 2014-06-28 | Discharge: 2014-06-28 | Disposition: A | Discharge status: Home / Self Care | Payer: 59 | Source: Ambulatory Visit | Attending: Gastroenterology | Admitting: Gastroenterology

## 2014-06-28 ENCOUNTER — Ambulatory Visit: Payer: 59 | Admitting: Anesthesiology

## 2014-06-28 ENCOUNTER — Encounter
Admission: RE | Disposition: A | Discharge status: Home / Self Care | Payer: Self-pay | Source: Ambulatory Visit | Attending: Gastroenterology

## 2014-06-28 ENCOUNTER — Encounter: Payer: Self-pay | Admitting: *Deleted

## 2014-06-28 DIAGNOSIS — I10 Essential (primary) hypertension: Secondary | ICD-10-CM | POA: Diagnosis not present

## 2014-06-28 DIAGNOSIS — Z859 Personal history of malignant neoplasm, unspecified: Secondary | ICD-10-CM | POA: Insufficient documentation

## 2014-06-28 DIAGNOSIS — K625 Hemorrhage of anus and rectum: Secondary | ICD-10-CM | POA: Diagnosis present

## 2014-06-28 DIAGNOSIS — Z79899 Other long term (current) drug therapy: Secondary | ICD-10-CM | POA: Insufficient documentation

## 2014-06-28 DIAGNOSIS — E119 Type 2 diabetes mellitus without complications: Secondary | ICD-10-CM | POA: Insufficient documentation

## 2014-06-28 DIAGNOSIS — Z794 Long term (current) use of insulin: Secondary | ICD-10-CM | POA: Insufficient documentation

## 2014-06-28 DIAGNOSIS — E079 Disorder of thyroid, unspecified: Secondary | ICD-10-CM | POA: Insufficient documentation

## 2014-06-28 DIAGNOSIS — G4733 Obstructive sleep apnea (adult) (pediatric): Secondary | ICD-10-CM | POA: Diagnosis not present

## 2014-06-28 DIAGNOSIS — J449 Chronic obstructive pulmonary disease, unspecified: Secondary | ICD-10-CM | POA: Diagnosis not present

## 2014-06-28 DIAGNOSIS — Z87891 Personal history of nicotine dependence: Secondary | ICD-10-CM | POA: Diagnosis not present

## 2014-06-28 DIAGNOSIS — K640 First degree hemorrhoids: Secondary | ICD-10-CM | POA: Insufficient documentation

## 2014-06-28 DIAGNOSIS — F419 Anxiety disorder, unspecified: Secondary | ICD-10-CM | POA: Diagnosis not present

## 2014-06-28 DIAGNOSIS — F329 Major depressive disorder, single episode, unspecified: Secondary | ICD-10-CM | POA: Diagnosis not present

## 2014-06-28 HISTORY — DX: Major depressive disorder, single episode, unspecified: F32.9

## 2014-06-28 HISTORY — DX: Type 2 diabetes mellitus without complications: E11.9

## 2014-06-28 HISTORY — DX: Depression, unspecified: F32.A

## 2014-06-28 HISTORY — PX: COLONOSCOPY WITH PROPOFOL: SHX5780

## 2014-06-28 HISTORY — DX: Anxiety disorder, unspecified: F41.9

## 2014-06-28 LAB — HM COLONOSCOPY

## 2014-06-28 LAB — GLUCOSE, CAPILLARY: Glucose-Capillary: 123 mg/dL — ABNORMAL HIGH (ref 65–99)

## 2014-06-28 SURGERY — COLONOSCOPY WITH PROPOFOL
Anesthesia: General

## 2014-06-28 MED ORDER — SODIUM CHLORIDE 0.9 % IV SOLN
INTRAVENOUS | Status: DC
  Administered 2014-06-28 (×2): via INTRAVENOUS

## 2014-06-28 MED ORDER — FENTANYL CITRATE (PF) 100 MCG/2ML IJ SOLN
INTRAMUSCULAR | Status: DC | PRN
  Administered 2014-06-28: 50 ug via INTRAVENOUS

## 2014-06-28 MED ORDER — PROPOFOL 10 MG/ML IV BOLUS
INTRAVENOUS | Status: DC | PRN
  Administered 2014-06-28 (×2): 50 mg via INTRAVENOUS

## 2014-06-28 MED ORDER — PROPOFOL INFUSION 10 MG/ML OPTIME
INTRAVENOUS | Status: DC | PRN
  Administered 2014-06-28: 140 ug/kg/min via INTRAVENOUS

## 2014-06-28 MED ORDER — SODIUM CHLORIDE 0.9 % IV SOLN: INTRAVENOUS | Status: DC

## 2014-06-28 MED ORDER — MIDAZOLAM HCL 5 MG/5ML IJ SOLN
INTRAMUSCULAR | Status: DC | PRN
  Administered 2014-06-28: 2 mg via INTRAVENOUS

## 2014-06-28 NOTE — Transfer of Care (Signed)
Immediate Anesthesia Transfer of Care Note  Patient: Steven Hoover  Procedure(s) Performed: Procedure(s): COLONOSCOPY WITH PROPOFOL (N/A)  Patient Location: PACU  Anesthesia Type:MAC  Level of Consciousness: awake and patient cooperative  Airway & Oxygen Therapy: Patient Spontanous Breathing and Patient connected to nasal cannula oxygen  Post-op Assessment: Report given to RN and Post -op Vital signs reviewed and stable  Post vital signs: Reviewed and stable  Last Vitals:  Filed Vitals:   06/28/14 0734  BP: 134/82  Pulse: 86  Temp: 36.6 C  Resp: 18    Complications: No apparent anesthesia complications

## 2014-06-28 NOTE — Op Note (Signed)
[facility] Gastroenterology Patient Name: Steven Hoover Procedure Date: 06/28/2014 8:08 AM MRN: [id number] Account #: [id number] Date of Birth: 05-Nov-1970 Admit Type: Outpatient Age: 44 Room: [facility] ENDO ROOM 2 Gender: Male Note Status: Finalized Procedure:         Colonoscopy Indications:       This is the patient's first colonoscopy, Rectal bleeding Patient Profile:   This is a 44 year old male. Providers:         Gerrit Heck. Rayann Heman, MD Referring MD:      Deborra Medina, MD (Referring MD) Medicines:         Propofol per Anesthesia Complications:     No immediate complications. Procedure:         Pre-Anesthesia Assessment:                    - Prior to the procedure, a History and Physical was                     performed, and patient medications and allergies were                     reviewed. The patient is competent. The risks and benefits                     of the procedure and the sedation options and risks were                     discussed with the patient. All questions were answered                     and informed consent was obtained. Patient identification                     and proposed procedure were verified by the physician and                     the nurse in the pre-procedure area. Mental Status                     Examination: alert and oriented. Airway Examination:                     normal oropharyngeal airway and neck mobility. Respiratory                     Examination: clear to auscultation. CV Examination: RRR,                     no murmurs, no S3 or S4. Prophylactic Antibiotics: The                     patient does not require prophylactic antibiotics. Prior                     Anticoagulants: The patient has taken no previous                     anticoagulant or antiplatelet agents. ASA Grade                     Assessment: II - A patient with mild systemic disease.                     After reviewing the risks and  benefits, the  patient was                     deemed in satisfactory condition to undergo the procedure.                     The anesthesia plan was to use monitored anesthesia care                     (MAC). Immediately prior to administration of medications,                     the patient was re-assessed for adequacy to receive                     sedatives. The heart rate, respiratory rate, oxygen                     saturations, blood pressure, adequacy of pulmonary                     ventilation, and response to care were monitored                     throughout the procedure. The physical status of the                     patient was re-assessed after the procedure.                    - Prior to the procedure, a History and Physical was                     performed, and patient medications, allergies and                     sensitivities were reviewed. The patient's tolerance of                     previous anesthesia was reviewed.                    - Prior to the procedure, a History and Physical was                     performed, and patient medications, allergies and                     sensitivities were reviewed. The patient's tolerance of                     previous anesthesia was reviewed.                    After obtaining informed consent, the colonoscope was                     passed under direct vision. Throughout the procedure, the                     patient's blood pressure, pulse, and oxygen saturations                     were monitored continuously. The Colonoscope was                     introduced through the anus and advanced to the the cecum,  identified by appendiceal orifice and ileocecal valve. The                     colonoscopy was performed without difficulty. The patient                     tolerated the procedure well. The quality of the bowel                     preparation was excellent. Findings:      The perianal and digital rectal  examinations were normal.      Internal hemorrhoids were found during retroflexion. The hemorrhoids       were Grade I (internal hemorrhoids that do not prolapse).      The exam was otherwise without abnormality. Impression:        - Internal hemorrhoids.                    - The examination was otherwise normal.                    - No specimens collected. Recommendation:    - Observe patient in GI recovery unit.                    - Continue present medications.                    - Repeat colonoscopy in 10 years for screening purposes.                    - Return to referring physician.                    - The findings and recommendations were discussed with the                     patient.                    - The findings and recommendations were discussed with the                     patient's family. Procedure Code(s): --- Professional ---                    [phone], Colonoscopy, flexible; diagnostic, including                     collection of specimen(s) by brushing or washing, when                     performed (separate procedure) CPT copyright 2014 American Medical Association. All rights reserved. The codes documented in this report are preliminary and upon coder review may  be revised to meet current compliance requirements. Mellody Life, MD 06/28/2014 8:36:16 AM This report has been signed electronically. Number of Addenda: 0 Note Initiated On: 06/28/2014 8:08 AM Scope Withdrawal Time: 0 hours 9 minutes 48 seconds  Total Procedure Duration: 0 hours 14 minutes 45 seconds       [facility]

## 2014-06-28 NOTE — H&P (Signed)
Primary Care Physician:  Crecencio Mc, MD  Pre-Procedure History & Physical: HPI:  Steven Hoover is a 44 y.o. male is here for an colonoscopy.   Past Medical History  Diagnosis Date  . OSA on CPAP 2011    on auto VPAP, sleeping well humidied 02  . Thyroid disease   . Hypertension   . Back pain   . Sinusitis   . Cancer     thyroid  . Anxiety   . Diabetes mellitus without complication   . Depression     Past Surgical History  Procedure Laterality Date  . Total thyroidectomy  2004  . Adnoidectomy      Prior to Admission medications   Medication Sig Start Date End Date Taking? Authorizing Provider  busPIRone (BUSPAR) 7.5 MG tablet TAKE 1 TABLET BY MOUTH 3 TIMES DAILY 12/24/13  Yes Crecencio Mc, MD  ibuprofen (ADVIL,MOTRIN) 200 MG tablet Take 400 mg by mouth every 6 (six) hours as needed for headache.   Yes Historical Provider, MD  insulin NPH Human (HUMULIN N) 100 UNIT/ML injection Inject 0.1 mLs (10 Units total) into the skin at bedtime. 04/22/14  Yes Crecencio Mc, MD  levothyroxine (SYNTHROID, LEVOTHROID) 125 MCG tablet Take 2 tablets (250 mcg total) by mouth daily before breakfast. Total daily dose 250 mcg 12/10/13  Yes Crecencio Mc, MD  losartan-hydrochlorothiazide (HYZAAR) 50-12.5 MG per tablet TAKE 1 TABLET BY MOUTH DAILY. 06/12/14  Yes Crecencio Mc, MD  metFORMIN (GLUCOPHAGE) 850 MG tablet Take 1 tablet (850 mg total) by mouth 2 (two) times daily with a meal. 12/07/13  Yes Crecencio Mc, MD  sitaGLIPtin (JANUVIA) 100 MG tablet Take 1 tablet (100 mg total) by mouth daily. 01/22/14  Yes Crecencio Mc, MD  Blood Glucose Monitoring Suppl (FREESTYLE FREEDOM LITE) W/DEVICE KIT Use to check sugars twice a day 11/05/13   Crecencio Mc, MD  fluticasone [facility]) 50 MCG/ACT nasal spray Place 2 sprays into both nostrils daily.    Historical Provider, MD  FREESTYLE LITE test strip Use twice daily 02/15/13   Crecencio Mc, MD  glucose blood (FREESTYLE TEST STRIPS) test  strip Use as instructed 11/05/13   Crecencio Mc, MD  Lactobacillus [facility] ACIDOPHILUS) CAPS 1 tablet twice daily for 10 days  While on antibiotics.  pharmacy may choose any available alternative Patient not taking: Reported on 06/05/2014 03/30/13   Crecencio Mc, MD  Lancets (FREESTYLE) lancets Use as directed 05/30/13   Crecencio Mc, MD  Lancets (FREESTYLE) lancets Use as instructed 11/05/13   Crecencio Mc, MD    Allergies as of 06/19/2014  . (No Known Allergies)    Family History  Problem Relation Age of Onset  . Diabetes Father   . COPD Neg Hx     History   Social History  . Marital Status: Married    Spouse Name: N/A  . Number of Children: N/A  . Years of Education: N/A   Occupational History  . Not on file.   Social History Main Topics  . Smoking status: Former Smoker    Quit date: 08/02/2010  . Smokeless tobacco: Never Used  . Alcohol Use: 3.0 oz/week    5 Glasses of wine per week  . Drug Use: No  . Sexual Activity: Not on file   Other Topics Concern  . Not on file   Social History Narrative     Physical Exam: BP 134/82 mmHg  Pulse 86  Temp(Src) 97.8 F (36.6 C) (Tympanic)  Resp 18  Ht 6' 2"  (1.88 m)  Wt 315 lb (142.883 kg)  BMI 40.43 kg/m2  SpO2 97% General:   Alert,  pleasant and cooperative in NAD Head:  Normocephalic and atraumatic. Neck:  Supple; no masses or thyromegaly. Lungs:  Clear throughout to auscultation.    Heart:  Regular rate and rhythm. Abdomen:  Soft, nontender and nondistended. Normal bowel sounds, without guarding, and without rebound.   Neurologic:  Alert and  oriented x4;  grossly normal neurologically.  Impression/Plan: Steven Hoover is here for an colonoscopy to be performed for rectal bleeding  Risks, benefits, limitations, and alternatives regarding  colonoscopy have been reviewed with the patient.  Questions have been answered.  All parties agreeable.   Josefine Class, MD  06/28/2014, 8:08 AM

## 2014-06-28 NOTE — Anesthesia Postprocedure Evaluation (Signed)
  Anesthesia Post-op Note  Patient: Steven Hoover  Procedure(s) Performed: Procedure(s): COLONOSCOPY WITH PROPOFOL (N/A)  Anesthesia type:General  Patient location: PACU  Post pain: Pain level controlled  Post assessment: Post-op Vital signs reviewed, Patient's Cardiovascular Status Stable, Respiratory Function Stable, Patent Airway and No signs of Nausea or vomiting  Post vital signs: Reviewed and stable  Last Vitals:  Filed Vitals:   06/28/14 0910  BP: 132/99  Pulse: 83  Temp:   Resp: 15    Level of consciousness: awake, alert  and patient cooperative  Complications: No apparent anesthesia complications

## 2014-06-28 NOTE — Anesthesia Preprocedure Evaluation (Signed)
Anesthesia Evaluation  Patient identified by MRN, date of birth, ID band Patient awake    Reviewed: Allergy & Precautions, H&P , NPO status , Patient's Chart, lab work & pertinent test results, reviewed documented beta blocker date and time   Airway Mallampati: III  TM Distance: >3 FB Neck ROM: full    Dental no notable dental hx. (+) Teeth Intact   Pulmonary neg pulmonary ROS, sleep apnea , former smoker,  breath sounds clear to auscultation  Pulmonary exam normal       Cardiovascular Exercise Tolerance: Good hypertension, negative cardio ROS  Rhythm:regular Rate:Normal     Neuro/Psych PSYCHIATRIC DISORDERS negative neurological ROS  negative psych ROS   GI/Hepatic negative GI ROS, Neg liver ROS,   Endo/Other  negative endocrine ROSdiabetesHypothyroidism   Renal/GU negative Renal ROS  negative genitourinary   Musculoskeletal   Abdominal   Peds  Hematology negative hematology ROS (+)   Anesthesia Other Findings   Reproductive/Obstetrics negative OB ROS                             Anesthesia Physical Anesthesia Plan  ASA: III  Anesthesia Plan: General   Post-op Pain Management:    Induction:   Airway Management Planned:   Additional Equipment:   Intra-op Plan:   Post-operative Plan:   Informed Consent: I have reviewed the patients History and Physical, chart, labs and discussed the procedure including the risks, benefits and alternatives for the proposed anesthesia with the patient or authorized representative who has indicated his/her understanding and acceptance.   Dental Advisory Given  Plan Discussed with: CRNA  Anesthesia Plan Comments:         Anesthesia Quick Evaluation

## 2014-07-02 ENCOUNTER — Encounter: Payer: Self-pay | Admitting: Gastroenterology

## 2014-07-08 NOTE — Addendum Note (Signed)
Addendum  created 07/08/14 1010 by Molli Barrows, MD   Modules edited: Anesthesia Responsible Staff

## 2014-07-23 ENCOUNTER — Other Ambulatory Visit: Payer: Self-pay | Admitting: Internal Medicine

## 2014-07-26 ENCOUNTER — Encounter: Payer: Self-pay | Admitting: Internal Medicine

## 2014-07-30 ENCOUNTER — Encounter: Payer: Self-pay | Admitting: Internal Medicine

## 2014-07-30 DIAGNOSIS — E1165 Type 2 diabetes mellitus with hyperglycemia: Secondary | ICD-10-CM

## 2014-07-30 DIAGNOSIS — IMO0002 Reserved for concepts with insufficient information to code with codable children: Secondary | ICD-10-CM

## 2014-08-02 ENCOUNTER — Other Ambulatory Visit (INDEPENDENT_AMBULATORY_CARE_PROVIDER_SITE_OTHER): Payer: 59

## 2014-08-02 DIAGNOSIS — E1165 Type 2 diabetes mellitus with hyperglycemia: Secondary | ICD-10-CM | POA: Diagnosis not present

## 2014-08-02 DIAGNOSIS — IMO0002 Reserved for concepts with insufficient information to code with codable children: Secondary | ICD-10-CM

## 2014-08-02 LAB — COMPREHENSIVE METABOLIC PANEL
ALT: 58 U/L — ABNORMAL HIGH (ref 0–53)
AST: 39 U/L — ABNORMAL HIGH (ref 0–37)
Albumin: 4.3 g/dL (ref 3.5–5.2)
Alkaline Phosphatase: 49 U/L (ref 39–117)
BUN: 12 mg/dL (ref 6–23)
CO2: 27 mEq/L (ref 19–32)
Calcium: 9.5 mg/dL (ref 8.4–10.5)
Chloride: 100 mEq/L (ref 96–112)
Creatinine, Ser: 0.86 mg/dL (ref 0.40–1.50)
GFR: 102.71 mL/min (ref >60.00)
Glucose, Bld: 92 mg/dL (ref 70–99)
Potassium: 3.8 mEq/L (ref 3.5–5.1)
Sodium: 138 mEq/L (ref 135–145)
Total Bilirubin: 0.6 mg/dL (ref 0.2–1.2)
Total Protein: 7.8 g/dL (ref 6.0–8.3)

## 2014-08-02 LAB — HEMOGLOBIN A1C: Hgb A1c MFr Bld: 6 % (ref 4.6–6.5)

## 2014-08-05 ENCOUNTER — Telehealth: Payer: Self-pay | Admitting: Internal Medicine

## 2014-08-05 NOTE — Telephone Encounter (Signed)
My chart message sent re labs

## 2014-09-04 ENCOUNTER — Ambulatory Visit: Payer: 59

## 2014-09-11 ENCOUNTER — Ambulatory Visit: Payer: 59

## 2014-09-21 ENCOUNTER — Encounter: Payer: Self-pay | Admitting: Internal Medicine

## 2014-09-25 ENCOUNTER — Other Ambulatory Visit: Payer: Self-pay | Admitting: Internal Medicine

## 2014-09-25 MED ORDER — LEVOTHYROXINE SODIUM 125 MCG PO TABS: ORAL_TABLET | ORAL | Status: DC

## 2014-09-27 ENCOUNTER — Telehealth: Payer: Self-pay | Admitting: Internal Medicine

## 2014-09-27 DIAGNOSIS — E034 Atrophy of thyroid (acquired): Secondary | ICD-10-CM

## 2014-09-27 NOTE — Telephone Encounter (Signed)
ordered

## 2014-09-27 NOTE — Telephone Encounter (Signed)
Pt sent a My chart message wanting to sch for Thyroid fasting blood draw. Need order please and Thank You!

## 2014-09-27 NOTE — Telephone Encounter (Signed)
Left message on VM to call to schedule lab appoint or to go to outpatient lab at hospital.

## 2014-09-27 NOTE — Telephone Encounter (Signed)
Please enter orders

## 2014-10-09 ENCOUNTER — Other Ambulatory Visit: Payer: Self-pay

## 2014-10-09 ENCOUNTER — Other Ambulatory Visit: Payer: 59

## 2014-10-09 VITALS — BP 104/68 | HR 83 | Resp 14 | Ht 74.0 in | Wt 321.8 lb

## 2014-10-09 DIAGNOSIS — E119 Type 2 diabetes mellitus without complications: Secondary | ICD-10-CM

## 2014-10-09 NOTE — Patient Instructions (Addendum)
1. Plan to continue to watch carbohydrates and not eating snacks at night 2. Plan to check blood sugars 2-3 times a day with goal of 120 or less fasting and 180 or less after eating. 3. Plan to lose 5-10 lbs in the next 3 months 4. Plan go to gym 3-4 times a week for 60 minutes 5. Plan to return to Link to Wellness on 01/06/15 at [place] at [facility] office [address]

## 2014-10-09 NOTE — Patient Outreach (Signed)
[name]) Care Management   10/09/2014  Steven Hoover [birth year] [id number]  Steven Hoover is an 44 y.o. male.   Member seen for follow up office visit for Link to Wellness program for self management of Type 2 diabetes Subjective: Member states that he completed the [facility] program and states that it was very helpful.  States that when he saw Dr.Tullo in July his hemoglobin A1C was down to 6.  States he is still trying to lose weight.  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  States he will be changing his job which he feels will be less stressful and he is happy about the change.   States that his blood sugars have been good and his morning readings are lower since he has stopped eating snacks at bedtime.   Objective:   Review of Systems  All other systems reviewed and are negative. Member did not bring glucometer for review.  Physical Exam  Today's Vitals   10/09/14 1119  BP: 104/68  Pulse: 83  Resp: 14  Height: 1.88 m (_0 )  Weight: 321 lb 12.8 oz (145.968 kg)  SpO2: 98%  PainSc: 0-No pain    Current Medications:   Current Outpatient Prescriptions  Medication Sig Dispense Refill  . Blood Glucose Monitoring Suppl (FREESTYLE FREEDOM LITE) W/DEVICE KIT Use to check sugars twice a day 1 each 0  . busPIRone (BUSPAR) 7.5 MG tablet TAKE 1 TABLET BY MOUTH 3 TIMES DAILY 90 tablet 1  . FREESTYLE LITE test strip Use twice daily 100 each 5  . glucose blood (FREESTYLE TEST STRIPS) test strip Use as instructed 100 each 12  . insulin NPH Human (HUMULIN N) 100 UNIT/ML injection Inject 0.1 mLs (10 Units total) into the skin at bedtime. 10 mL 11  . JANUVIA 100 MG tablet TAKE 1 TABLET (100 MG TOTAL) BY MOUTH DAILY. 90 tablet 1  . Lactobacillus (FLORAJEN ACIDOPHILUS) CAPS 1 tablet twice daily for 10 days  While on antibiotics.  pharmacy may choose any available alternative 30  capsule 0  . Lancets (FREESTYLE) lancets Use as directed 100 each 12  . Lancets (FREESTYLE) lancets Use as instructed 100 each 12  . levothyroxine (SYNTHROID, LEVOTHROID) 125 MCG tablet TAKE 2 TABLETS (250 MCG TOTAL) BY MOUTH DAILY BEFORE BREAKFAST (TOTAL DAILY DOSE 250 MCG) 60 tablet 1  . losartan-hydrochlorothiazide (HYZAAR) 50-12.5 MG per tablet TAKE 1 TABLET BY MOUTH DAILY. 90 tablet 1  . metFORMIN (GLUCOPHAGE) 850 MG tablet TAKE 1 TABLET BY MOUTH 2 TIMES DAILY WITH A MEAL. 180 tablet 1  . fluticasone (FLONASE) 50 MCG/ACT nasal spray Place 2 sprays into both nostrils daily.    Marland Kitchen ibuprofen (ADVIL,MOTRIN) 200 MG tablet Take 400 mg by mouth every 6 (six) hours as needed for headache.     No current facility-administered medications for this visit.    Functional Status:   In your present state of health, do you have any difficulty performing the following activities: 10/09/2014 06/05/2014  Hearing? N N  Vision? N N  Difficulty concentrating or making decisions? N N  Walking or climbing stairs? N N  Dressing or bathing? N N  Doing errands, shopping? N N    Fall/Depression Screening:    PHQ 2/9 Scores 10/09/2014 06/05/2014  PHQ - 2 Score 0 0    Assessment:   Member seen for follow up office visit  for Link to Wellness program for self management of Type 2 diabetes.  Member is maintaining goal of hemoglobin A1C of below 7 with last result of 6.  Member reports that his CBGs have been ranging from 102-128 fasting which is at his goal.  Member is eating less snacks at night and is cutting portion sizes at meals.  Member reports being active and going to gym 2-3 times a week.  Member taking insulin and medications without reported difficulty.  Plan:  1. Plan to continue to watch carbohydrates and not eating snacks at night 2. Plan to check blood sugars 2-3 times a day with goal of 120 or less fasting and 180 or less after eating. 3. Plan to lose 5-10 lbs in the next 3 months 4. Plan go to gym 3-4  times a week for 60 minutes 5. Plan to return to Link to Wellness on 01/06/15 at [place] at [facility] office [place] Problem One        Most Recent Value   Care Plan Problem One  Potential for elevated blood sugars related to dx of Type 2 DM   Role Documenting the Problem One  Care Management [place] for Problem One  Active   THN Long Term Goal (31-90 days)  Member will maintain blood sugars within goal ranges as evidenced by hemoglobin A1C of 7 or below   [facility] Long Term Goal Start Date  10/09/14 [Members last hemoglobin A1C 6]   Interventions for Problem One Long Term Goal  Reviewed CHO counting and portion sizes, Discussed making wise choices when eating out,Discussed weight loss and how number of calories and exercise amount effect his weight loss,  Reinforced when he does eat PM snacks to  limit to 15 GM of CHO and  to include a protein with the snack,  Reinforced  to continue to  go to the gym at least 3 times a week,      [name] RN, [facility] Care Management Coordinator-Link to [place] Management [phone]

## 2014-11-25 ENCOUNTER — Encounter: Payer: Self-pay | Admitting: Internal Medicine

## 2014-11-26 ENCOUNTER — Encounter: Payer: Self-pay | Admitting: Physician Assistant

## 2014-11-26 ENCOUNTER — Ambulatory Visit: Payer: Self-pay | Admitting: Family

## 2014-11-26 VITALS — BP 160/90 | Temp 98.6°F

## 2014-11-26 DIAGNOSIS — J0100 Acute maxillary sinusitis, unspecified: Secondary | ICD-10-CM

## 2014-11-26 DIAGNOSIS — H65191 Other acute nonsuppurative otitis media, right ear: Secondary | ICD-10-CM

## 2014-11-26 MED ORDER — AMOXICILLIN-POT CLAVULANATE 875-125 MG PO TABS
1.0000 | ORAL_TABLET | Freq: Two times a day (BID) | ORAL | Status: DC
Start: 2014-11-26 — End: 2015-03-10

## 2014-11-26 NOTE — Progress Notes (Signed)
S/ chronic allergies now with acute c/o facial pain , ear pressure , blowing brown, fatigue denies CP ,SOB   O/ alert pleasant mildly ill appearing , b/p elevated ENT R tm red , both retracted ,dull, nasal turbinates swollen, inflammed, + max tenderness Pharynx injected neck supple , heart rsr lungs are clear A/ maxillary sinusitis  R OM  P /Augmentin 875 bid , nasal saline products, allergy meds otc, supportive measures. F/u prn not improving.

## 2014-12-19 ENCOUNTER — Encounter: Payer: Self-pay | Admitting: Internal Medicine

## 2014-12-23 ENCOUNTER — Encounter: Payer: Self-pay | Admitting: Internal Medicine

## 2014-12-25 ENCOUNTER — Other Ambulatory Visit: Payer: Self-pay | Admitting: Internal Medicine

## 2014-12-25 DIAGNOSIS — IMO0002 Reserved for concepts with insufficient information to code with codable children: Secondary | ICD-10-CM

## 2014-12-25 DIAGNOSIS — E1122 Type 2 diabetes mellitus with diabetic chronic kidney disease: Secondary | ICD-10-CM

## 2014-12-25 DIAGNOSIS — E1165 Type 2 diabetes mellitus with hyperglycemia: Secondary | ICD-10-CM

## 2014-12-25 DIAGNOSIS — N182 Chronic kidney disease, stage 2 (mild): Principal | ICD-10-CM

## 2014-12-25 MED ORDER — LEVOTHYROXINE SODIUM 125 MCG PO TABS: ORAL_TABLET | ORAL | Status: DC

## 2014-12-31 ENCOUNTER — Other Ambulatory Visit: Payer: Self-pay | Admitting: Internal Medicine

## 2015-01-06 ENCOUNTER — Ambulatory Visit: Payer: 59

## 2015-01-17 ENCOUNTER — Ambulatory Visit: Payer: Self-pay | Admitting: Physician Assistant

## 2015-01-29 ENCOUNTER — Other Ambulatory Visit: Payer: Self-pay | Admitting: Internal Medicine

## 2015-01-29 MED ORDER — SITAGLIPTIN PHOSPHATE 100 MG PO TABS: ORAL_TABLET | ORAL | Status: DC

## 2015-01-29 NOTE — Telephone Encounter (Signed)
refilled.  Patient needs fasting labs and OV in January

## 2015-01-29 NOTE — Telephone Encounter (Signed)
Please advise refill, as last OV was 04/22/2014. Thanks

## 2015-02-06 ENCOUNTER — Other Ambulatory Visit: Payer: Self-pay

## 2015-02-06 VITALS — BP 136/86 | HR 90 | Resp 16 | Ht 75.0 in | Wt 326.6 lb

## 2015-02-06 DIAGNOSIS — Z794 Long term (current) use of insulin: Secondary | ICD-10-CM

## 2015-02-06 DIAGNOSIS — E119 Type 2 diabetes mellitus without complications: Secondary | ICD-10-CM

## 2015-02-06 NOTE — Patient Outreach (Signed)
[facility] [facility]) Care Management   02/06/2015  Steven Hoover Jun 03, 1970 [id number]  Steven Hoover is an 45 y.o. male.   Member seen for follow up office visit for [facility] program for self management of Type 2 diabetes  Subjective: Member states that he has gained some weight over the holidays and his blood sugars have been up some.  States that now that he is back from vacation he has gotten back to eating better and going to the gym 2-3 times a week.  States that he plans to rejoin the [facility] program which helped him a lot.  States he is due to go back to see Dr.Tullo as he had to cancel an appointment last month with her.    Objective:   Review of Systems  All other systems reviewed and are negative.   Physical Exam  Today's Vitals   02/06/15 1547  BP: 136/86  Pulse: 90  Resp: 16  Height: 1.905 m (_0 )  Weight: 326 lb 9.6 oz (148.145 kg)  SpO2: 96%  PainSc: 0-No pain    Current Medications:   Current Outpatient Prescriptions  Medication Sig Dispense Refill  . Blood Glucose Monitoring Suppl (FREESTYLE FREEDOM LITE) W/DEVICE KIT Use to check sugars twice a day 1 each 0  . fluticasone (FLONASE) 50 MCG/ACT nasal spray Place 2 sprays into both nostrils daily.    Marland Kitchen glucose blood (FREESTYLE TEST STRIPS) test strip Use as instructed 100 each 12  . ibuprofen (ADVIL,MOTRIN) 200 MG tablet Take 400 mg by mouth every 6 (six) hours as needed for headache.    . insulin NPH Human (HUMULIN N) 100 UNIT/ML injection Inject 0.1 mLs (10 Units total) into the skin at bedtime. 10 mL 11  . Lactobacillus (FLORAJEN ACIDOPHILUS) CAPS 1 tablet twice daily for 10 days  While on antibiotics.  pharmacy may choose any available alternative 30 capsule 0  . Lancets (FREESTYLE) lancets Use as directed 100 each 12  . Lancets (FREESTYLE) lancets Use as instructed 100 each 12  . levothyroxine (SYNTHROID, LEVOTHROID) 125 MCG tablet TAKE 2 TABLETS (250 MCG TOTAL) BY MOUTH DAILY  BEFORE BREAKFAST (TOTAL DAILY DOSE 250 MCG) 60 tablet 1  . losartan-hydrochlorothiazide (HYZAAR) 50-12.5 MG tablet TAKE 1 TABLET BY MOUTH DAILY. 90 tablet 1  . metFORMIN (GLUCOPHAGE) 850 MG tablet TAKE 1 TABLET BY MOUTH 2 TIMES DAILY WITH A MEAL. 180 tablet 1  . sitaGLIPtin (JANUVIA) 100 MG tablet TAKE 1 TABLET (100 MG TOTAL) BY MOUTH DAILY. 90 tablet 1  . amoxicillin-clavulanate (AUGMENTIN) 875-125 MG tablet Take 1 tablet by mouth 2 (two) times daily. (Patient not taking: Reported on 02/06/2015) 20 tablet 0  . busPIRone (BUSPAR) 7.5 MG tablet TAKE 1 TABLET BY MOUTH 3 TIMES DAILY (Patient not taking: Reported on 02/06/2015) 90 tablet 1  . FREESTYLE LITE test strip Use twice daily 100 each 5   No current facility-administered medications for this visit.    Functional Status:   In your present state of health, do you have any difficulty performing the following activities: 02/06/2015 10/09/2014  Hearing? N N  Vision? N N  Difficulty concentrating or making decisions? N N  Walking or climbing stairs? N N  Dressing or bathing? N N  Doing errands, shopping? N N    Fall/Depression Screening:    PHQ 2/9 Scores 02/06/2015 10/09/2014 06/05/2014  PHQ - 2 Score 0 0 0    Assessment:   Member seen for follow up office visit for Link  to Wellness program for self management of Type 2 diabetes.  Member has been meeting diabetes self management goal of hemoglobin A1C of 7 or less with last reading 6%.  Member has not been as adherent with diet and exercise while on vacation but has resumed exercising 2-3 times a week at the gym.  Reports fasting blood sugars range120-210 and post prandial 180-250.    Plan:   Plan get back  to watching carbohydrates and not eating snacks at night Plan to check 2-3 times a day with goal of 120 or less fasting and 180 or less after eating. Plan to lose 5-10 lbs in the next 3 months Plan go to gym 3-4 times a week for 60 minutes Plan to call Dr. Derrel Nip this week to schedule follow up  appointment Plan to rejoin the [facility] program Plan to return to [facility] Wellness on 05/08/15 at 3:30PM at [facility] office [place] Problem One        Most Recent Value   Care Plan Problem One  Potential for elevated blood sugars related to dx of Type 2 DM   Role Documenting the Problem One  Care Management [place] for Problem One  Active   [facility] Long Term Goal (31-90 days)  Member will maintain blood sugars within goal ranges as evidenced by hemoglobin A1C of 7 or below   [facility] Long Term Goal Start Date  02/06/15 [Members last hemoglobin A1C 6]   Interventions for Problem One Long Term Goal  Reviewed CHO counting and portion sizes, Encouraged to join the next session of the [employer], Reinforced importance of weight loss to his glycemic control,  Instructed to call Dr. Derrel Nip to schedule a follow up appointment,  Encouraged to resume going to the gym at least 3 times a week,     [name] RN, [facility] Care Management Coordinator-Link to [place] Management [phone]

## 2015-02-06 NOTE — Patient Instructions (Signed)
1. Plan get back  to watching carbohydrates and not eating snacks at night 2. Plan to check 2-3 times a day with goal of 120 or less fasting and 180 or less after eating. 3. Plan to lose 5-10 lbs in the next 3 months 4. Plan go to gym 3-4 times a week for 60 minutes 5. Plan to call Dr. Derrel Nip this week to schedule follow up appointment 6. Plan to rejoin the [facility] program 7. Plan to return to [facility] to [facility]ness on 05/08/15 at 3:30PM at [facility] office [address]

## 2015-03-10 ENCOUNTER — Ambulatory Visit (INDEPENDENT_AMBULATORY_CARE_PROVIDER_SITE_OTHER): Payer: 59 | Admitting: Internal Medicine

## 2015-03-10 ENCOUNTER — Encounter: Payer: Self-pay | Admitting: Internal Medicine

## 2015-03-10 VITALS — BP 120/94 | HR 94 | Temp 98.3°F | Resp 12 | Ht 73.5 in | Wt 327.0 lb

## 2015-03-10 DIAGNOSIS — M545 Low back pain, unspecified: Secondary | ICD-10-CM

## 2015-03-10 DIAGNOSIS — E559 Vitamin D deficiency, unspecified: Secondary | ICD-10-CM | POA: Diagnosis not present

## 2015-03-10 DIAGNOSIS — R5383 Other fatigue: Secondary | ICD-10-CM

## 2015-03-10 DIAGNOSIS — E119 Type 2 diabetes mellitus without complications: Secondary | ICD-10-CM

## 2015-03-10 DIAGNOSIS — I10 Essential (primary) hypertension: Secondary | ICD-10-CM | POA: Diagnosis not present

## 2015-03-10 DIAGNOSIS — D239 Other benign neoplasm of skin, unspecified: Secondary | ICD-10-CM

## 2015-03-10 DIAGNOSIS — Z0000 Encounter for general adult medical examination without abnormal findings: Secondary | ICD-10-CM | POA: Diagnosis not present

## 2015-03-10 DIAGNOSIS — E032 Hypothyroidism due to medicaments and other exogenous substances: Secondary | ICD-10-CM

## 2015-03-10 DIAGNOSIS — M5489 Other dorsalgia: Secondary | ICD-10-CM

## 2015-03-10 DIAGNOSIS — Z6841 Body Mass Index (BMI) 40.0 and over, adult: Secondary | ICD-10-CM

## 2015-03-10 DIAGNOSIS — D229 Melanocytic nevi, unspecified: Secondary | ICD-10-CM

## 2015-03-10 DIAGNOSIS — Z1159 Encounter for screening for other viral diseases: Secondary | ICD-10-CM

## 2015-03-10 MED ORDER — TRAMADOL HCL 50 MG PO TABS: 50.0000 mg | ORAL_TABLET | Freq: Three times a day (TID) | ORAL | Status: DC | PRN

## 2015-03-10 NOTE — Progress Notes (Signed)
Pre-visit discussion using our clinic review tool. No additional management support is needed unless otherwise documented below in the visit note.  

## 2015-03-10 NOTE — Patient Instructions (Addendum)
Steven Hoover now makes a frozen breakfast frittata that can be microwaved in 2 minutes and is very low carb.    You can use stool softeners daily to prevent bleeding of hemorrhoids ("colace,  Generic is called   docusate)   25 - 35 g dietary fiber daily is your goal   You can use up to 800 mg motrin /advil plus 500 mg tylenol every 8 hours during flares of back and neck pain along with tramadol for severe pain   Health Maintenance, Male A healthy lifestyle and preventative care can promote health and wellness.  Maintain regular health, dental, and eye exams.  Eat a healthy diet. Foods like vegetables, fruits, whole grains, low-fat dairy products, and lean protein foods contain the nutrients you need and are low in calories. Decrease your intake of foods high in solid fats, added sugars, and salt. Get information about a proper diet from your health care provider, if necessary.  Regular physical exercise is one of the most important things you can do for your health. Most adults should get at least 150 minutes of moderate-intensity exercise (any activity that increases your heart rate and causes you to sweat) each week. In addition, most adults need muscle-strengthening exercises on 2 or more days a week.   Maintain a healthy weight. The body mass index (BMI) is a screening tool to identify possible weight problems. It provides an estimate of body fat based on height and weight. Your health care provider can find your BMI and can help you achieve or maintain a healthy weight. For males 20 years and older:  A BMI below 18.5 is considered underweight.  A BMI of 18.5 to 24.9 is normal.  A BMI of 25 to 29.9 is considered overweight.  A BMI of 30 and above is considered obese.  Maintain normal blood lipids and cholesterol by exercising and minimizing your intake of saturated fat. Eat a balanced diet with plenty of fruits and vegetables. Blood tests for lipids and cholesterol should begin at age  63 and be repeated every 5 years. If your lipid or cholesterol levels are high, you are over age 60, or you are at high risk for heart disease, you may need your cholesterol levels checked more frequently.Ongoing high lipid and cholesterol levels should be treated with medicines if diet and exercise are not working.  If you smoke, find out from your health care provider how to quit. If you do not use tobacco, do not start.  Lung cancer screening is recommended for adults aged 75-80 years who are at high risk for developing lung cancer because of a history of smoking. A yearly low-dose CT scan of the lungs is recommended for people who have at least a 30-pack-year history of smoking and are current smokers or have quit within the past 15 years. A pack year of smoking is smoking an average of 1 pack of cigarettes a day for 1 year (for example, a 30-pack-year history of smoking could mean smoking 1 pack a day for 30 years or 2 packs a day for 15 years). Yearly screening should continue until the smoker has stopped smoking for at least 15 years. Yearly screening should be stopped for people who develop a health problem that would prevent them from having lung cancer treatment.  If you choose to drink alcohol, do not have more than 2 drinks per day. One drink is considered to be 12 oz (360 mL) of beer, 5 oz (150 mL) of wine,  or 1.5 oz (45 mL) of liquor.  Avoid the use of street drugs. Do not share needles with anyone. Ask for help if you need support or instructions about stopping the use of drugs.  High blood pressure causes heart disease and increases the risk of stroke. High blood pressure is more likely to develop in:  People who have blood pressure in the end of the normal range (100-139/85-89 mm Hg).  People who are overweight or obese.  People who are African American.  If you are 27-31 years of age, have your blood pressure checked every 3-5 years. If you are 42 years of age or older, have your  blood pressure checked every year. You should have your blood pressure measured twice--once when you are at a hospital or clinic, and once when you are not at a hospital or clinic. Record the average of the two measurements. To check your blood pressure when you are not at a hospital or clinic, you can use:  An automated blood pressure machine at a pharmacy.  A home blood pressure monitor.  If you are 79-19 years old, ask your health care provider if you should take aspirin to prevent heart disease.  Diabetes screening involves taking a blood sample to check your fasting blood sugar level. This should be done once every 3 years after age 61 if you are at a normal weight and without risk factors for diabetes. Testing should be considered at a younger age or be carried out more frequently if you are overweight and have at least 1 risk factor for diabetes.  Colorectal cancer can be detected and often prevented. Most routine colorectal cancer screening begins at the age of 95 and continues through age 66. However, your health care provider may recommend screening at an earlier age if you have risk factors for colon cancer. On a yearly basis, your health care provider may provide home test kits to check for hidden blood in the stool. A small camera at the end of a tube may be used to directly examine the colon (sigmoidoscopy or colonoscopy) to detect the earliest forms of colorectal cancer. Talk to your health care provider about this at age 19 when routine screening begins. A direct exam of the colon should be repeated every 5-10 years through age 87, unless early forms of precancerous polyps or small growths are found.  People who are at an increased risk for hepatitis B should be screened for this virus. You are considered at high risk for hepatitis B if:  You were born in a country where hepatitis B occurs often. Talk with your health care provider about which countries are considered high risk.  Your  parents were born in a high-risk country and you have not received a shot to protect against hepatitis B (hepatitis B vaccine).  You have HIV or AIDS.  You use needles to inject street drugs.  You live with, or have sex with, someone who has hepatitis B.  You are a man who has sex with other men (MSM).  You get hemodialysis treatment.  You take certain medicines for conditions like cancer, organ transplantation, and autoimmune conditions.  Hepatitis C blood testing is recommended for all people born from [birth year] through 1965 and any individual with known risk factors for hepatitis C.  Healthy men should no longer receive prostate-specific antigen (PSA) blood tests as part of routine cancer screening. Talk to your health care provider about prostate cancer screening.  Testicular cancer screening is  not recommended for adolescents or adult males who have no symptoms. Screening includes self-exam, a health care provider exam, and other screening tests. Consult with your health care provider about any symptoms you have or any concerns you have about testicular cancer.  Practice safe sex. Use condoms and avoid high-risk sexual practices to reduce the spread of sexually transmitted infections (STIs).  You should be screened for STIs, including gonorrhea and chlamydia if:  You are sexually active and are younger than 24 years.  You are older than 24 years, and your health care provider tells you that you are at risk for this type of infection.  Your sexual activity has changed since you were last screened, and you are at an increased risk for chlamydia or gonorrhea. Ask your health care provider if you are at risk.  If you are at risk of being infected with HIV, it is recommended that you take a prescription medicine daily to prevent HIV infection. This is called pre-exposure prophylaxis (PrEP). You are considered at risk if:  You are a man who has sex with other men (MSM).  You are a  heterosexual man who is sexually active with multiple partners.  You take drugs by injection.  You are sexually active with a partner who has HIV.  Talk with your health care provider about whether you are at high risk of being infected with HIV. If you choose to begin PrEP, you should first be tested for HIV. You should then be tested every 3 months for as long as you are taking PrEP.  Use sunscreen. Apply sunscreen liberally and repeatedly throughout the day. You should seek shade when your shadow is shorter than you. Protect yourself by wearing long sleeves, pants, a wide-brimmed hat, and sunglasses year round whenever you are outdoors.  Tell your health care provider of new moles or changes in moles, especially if there is a change in shape or color. Also, tell your health care provider if a mole is larger than the size of a pencil eraser.  A one-time screening for abdominal aortic aneurysm (AAA) and surgical repair of large AAAs by ultrasound is recommended for men aged 22-75 years who are current or former smokers.  Stay current with your vaccines (immunizations).   This information is not intended to replace advice given to you by your health care provider. Make sure you discuss any questions you have with your health care provider.   Document Released: 07/17/2007 Document Revised: 02/08/2014 Document Reviewed: 06/15/2010 Elsevier Interactive Patient Education [employer].

## 2015-03-10 NOTE — Progress Notes (Signed)
Patient ID: Steven Hoover, male    DOB: 12-02-1970  Age: 45 y.o. MRN: [id number]  The patient is here for annual  wellness examination and management of other chronic and acute problems.   The risk factors are reflected in the social history.  The roster of all physicians providing medical care to patient - is listed in the Snapshot section of the chart.  Activities of daily living:  The patient is 100% independent in all ADLs: dressing, toileting, feeding as well as independent mobility  Home safety : The patient has smoke detectors in the home. They wear seatbelts.  There are no firearms at home. There is no violence in the home.   There is no risks for hepatitis, STDs or HIV. There is no   history of blood transfusion. They have no travel history to infectious disease endemic areas of the world.  The patient has seen their dentist in the last six month. They have seen their eye doctor in the last year. They admit to slight hearing difficulty with regard to whispered voices and some television programs.  They have deferred audiologic testing in the last year.  They do not  have excessive sun exposure. Discussed the need for sun protection: hats, long sleeves and use of sunscreen if there is significant sun exposure.   Diet: the importance of a healthy diet is discussed. They do have a healthy diet.  The benefits of regular aerobic exercise were discussed. She walks 4 times per week ,  20 minutes.   Depression screen: there are no signs or vegative symptoms of depression- irritability, change in appetite, anhedonia, sadness/tearfullness.  Cognitive assessment: the patient manages all their financial and personal affairs and is actively engaged. They could relate day,date,year and events; recalled 2/3 objects at 3 minutes; performed clock-face test normally.  The following portions of the patient's history were reviewed and updated as appropriate: allergies, current medications, past family  history, past medical history,  past surgical history, past social history  and problem list.  Visual acuity was not assessed per patient preference since she has regular follow up with her ophthalmologist. Hearing and body mass index were assessed and reviewed.   During the course of the visit the patient was educated and counseled about appropriate screening and preventive services including : fall prevention , diabetes screening, nutrition counseling, colorectal cancer screening, and recommended immunizations.    CC: The primary encounter diagnosis was Essential hypertension, benign. Diagnoses of Hypothyroidism, iatrogenic, Diabetes mellitus without complication ([place]), Vitamin D deficiency, Other fatigue, Need for hepatitis C screening test, Multiple atypical nevi, Morbid obesity with BMI of 40.0-44.9, adult ([place]), Back pain, lumbosacral, and Routine general medical examination at a health care facility were also pertinent to this visit.   Follow up on iatrogenic hypothyroidism, Type 2 DM, .OSA on CPAP downloads go to sleep med.   Last study 5-6 yrs ago  DM; sugars going up again due to eating out and take out.   Back pain with sciatica since Christmas,  Left leg.  Spinal stenosis on prior MRI 2011.Kathleen Argue Neurolgoy advised to bear it until  urinary issues.. No urinary issues or foot drop,      posterior neck pain on the left , nonradiating     Lab Results  Component Value Date   TSH 7.99* 03/11/2015     Lab Results  Component Value Date   HGBA1C 7.6* 03/11/2015    History Steven Hoover has a past medical history of OSA  on CPAP (2011); Thyroid disease; Hypertension; Back pain; Sinusitis; Cancer ([place]); Anxiety; Diabetes mellitus without complication ([place]); and Depression.   He has past surgical history that includes Total thyroidectomy (2004); adnoidectomy; and Colonoscopy with propofol (N/A, 06/28/2014).   His family history includes Diabetes in his father. There is no history of  COPD.He reports that he quit smoking about 4 years ago. He has never used smokeless tobacco. He reports that he drinks about 3.0 oz of alcohol per week. He reports that he does not use illicit drugs.  Outpatient Prescriptions Prior to Visit  Medication Sig Dispense Refill  . Blood Glucose Monitoring Suppl (FREESTYLE FREEDOM LITE) W/DEVICE KIT Use to check sugars twice a day 1 each 0  . fluticasone (FLONASE) 50 MCG/ACT nasal spray Place 2 sprays into both nostrils daily.    Marland Kitchen FREESTYLE LITE test strip Use twice daily 100 each 5  . glucose blood (FREESTYLE TEST STRIPS) test strip Use as instructed 100 each 12  . ibuprofen (ADVIL,MOTRIN) 200 MG tablet Take 400 mg by mouth every 6 (six) hours as needed for headache.    . Lactobacillus (FLORAJEN ACIDOPHILUS) CAPS 1 tablet twice daily for 10 days  While on antibiotics.  pharmacy may choose any available alternative 30 capsule 0  . Lancets (FREESTYLE) lancets Use as directed 100 each 12  . Lancets (FREESTYLE) lancets Use as instructed 100 each 12  . losartan-hydrochlorothiazide (HYZAAR) 50-12.5 MG tablet TAKE 1 TABLET BY MOUTH DAILY. 90 tablet 1  . metFORMIN (GLUCOPHAGE) 850 MG tablet TAKE 1 TABLET BY MOUTH 2 TIMES DAILY WITH A MEAL. 180 tablet 1  . sitaGLIPtin (JANUVIA) 100 MG tablet TAKE 1 TABLET (100 MG TOTAL) BY MOUTH DAILY. 90 tablet 1  . insulin NPH Human (HUMULIN N) 100 UNIT/ML injection Inject 0.1 mLs (10 Units total) into the skin at bedtime. 10 mL 11  . levothyroxine (SYNTHROID, LEVOTHROID) 125 MCG tablet TAKE 2 TABLETS (250 MCG TOTAL) BY MOUTH DAILY BEFORE BREAKFAST (TOTAL DAILY DOSE 250 MCG) 60 tablet 1  . amoxicillin-clavulanate (AUGMENTIN) 875-125 MG tablet Take 1 tablet by mouth 2 (two) times daily. (Patient not taking: Reported on 02/06/2015) 20 tablet 0  . busPIRone (BUSPAR) 7.5 MG tablet TAKE 1 TABLET BY MOUTH 3 TIMES DAILY (Patient not taking: Reported on 02/06/2015) 90 tablet 1   No facility-administered medications prior to visit.     Review of Systems   Patient denies headache, fevers, malaise, unintentional weight loss, skin rash, eye pain, sinus congestion and sinus pain, sore throat, dysphagia,  hemoptysis , cough, dyspnea, wheezing, chest pain, palpitations, orthopnea, edema, abdominal pain, nausea, melena, diarrhea, constipation, flank pain, dysuria, hematuria, urinary  Frequency, nocturia, numbness, tingling, seizures,  Focal weakness, Loss of consciousness,  Tremor, insomnia, depression, anxiety, and suicidal ideation.      Objective:  BP 120/94 mmHg  Pulse 94  Temp(Src) 98.3 F (36.8 C) (Oral)  Resp 12  Ht 6' 1.5" (1.867 m)  Wt 327 lb (148.326 kg)  BMI 42.55 kg/m2  SpO2 97%  Physical Exam   General appearance: alert, cooperative and appears stated age Ears: normal TM's and external ear canals both ears Throat: lips, mucosa, and tongue normal; teeth and gums normal Neck: no adenopathy, no carotid bruit, supple, symmetrical, trachea midline and thyroid not enlarged, symmetric, no tenderness/mass/nodules Back: symmetric, no curvature. ROM normal. No CVA tenderness. Lungs: clear to auscultation bilaterally Heart: regular rate and rhythm, S1, S2 normal, no murmur, click, rub or gallop Abdomen: soft, non-tender; bowel sounds normal; no masses,  no organomegaly Pulses: 2+ and symmetric Skin: Skin color, texture, turgor normal. No rashes or lesions Lymph nodes: Cervical, supraclavicular, and axillary nodes normal.    Assessment & Plan:   Problem List Items Addressed This Visit    Morbid obesity with BMI of 40.0-44.9, adult ([place])    I have addressed  BMI and recommended a low glycemic index diet utilizing smaller more frequent meals to increase metabolism.  I have also recommended that patient start exercising with a goal of 30 minutes of aerobic exercise a minimum of 5 days per week. Screening for lipid disorders, thyroid and diabetes to be done today.        Relevant Medications   insulin NPH  Human (HUMULIN N) 100 UNIT/ML injection   Back pain, lumbosacral    With intermittent sciatica down the left leg. Encouraged to lose weight. Continue tramadol and motrin       Relevant Medications   traMADol (ULTRAM) 50 MG tablet   Type II diabetes mellitus, uncontrolled ([place])    Loss of control since last visit .  continue januvia, metformin and increase NPH to 15 units at bedtime.   Lab Results  Component Value Date   HGBA1C 7.6* 03/11/2015           Relevant Medications   insulin NPH Human (HUMULIN N) 100 UNIT/ML injection   Hypothyroidism, iatrogenic    Secondary to surgical resection for thyroid CA.  TSH is elevated but T4 is in range   Will increase dose from 250  Mcg to 274 mcg.    Lab Results  Component Value Date   TSH 7.99* 03/11/2015            Relevant Medications   levothyroxine (SYNTHROID, LEVOTHROID) 137 MCG tablet   Other Relevant Orders   TSH (Completed)   T4, free (Completed)   Routine general medical examination at a health care facility    Annual comprehensive preventive exam was done as well as an evaluation and management of acute and chronic conditions .  During the course of the visit the patient was educated and counseled about appropriate screening and preventive services including :  diabetes screening, lipid analysis with projected  10 year  risk for CAD , nutrition counseling, prostate and colorectal cancer screening, and recommended immunizations.  Printed recommendations for health maintenance screenings was given.       Essential hypertension, benign - Primary    Diastolic elevation noted today, but outside checks have been normal.  Lab Results  Component Value Date   CREATININE 0.89 03/11/2015   Lab Results  Component Value Date   NA 136 03/11/2015   K 3.8 03/11/2015   CL 99 03/11/2015   CO2 29 03/11/2015   Lab Results  Component Value Date   MICROALBUR <0.7 03/11/2015         Relevant Orders   Comprehensive metabolic  panel (Completed)    Other Visit Diagnoses    Vitamin D deficiency        Relevant Orders    VITAMIN D 25 Hydroxy (Vit-D Deficiency, Fractures) (Completed)    Other fatigue        Relevant Orders    Comprehensive metabolic panel (Completed)    CBC with Differential/Platelet (Completed)    Need for hepatitis C screening test        Multiple atypical nevi        Relevant Orders    Ambulatory referral to Dermatology       I have  discontinued Mr. Neubert's busPIRone and amoxicillin-clavulanate. I have also changed his levothyroxine and insulin NPH Human. Additionally, I am having him start on traMADol. Lastly, I am having him maintain his FREESTYLE LITE, FLORAJEN ACIDOPHILUS, freestyle, FREESTYLE FREEDOM LITE, glucose blood, freestyle, ibuprofen, fluticasone, metFORMIN, losartan-hydrochlorothiazide, and sitaGLIPtin.  Meds ordered this encounter  Medications  . traMADol (ULTRAM) 50 MG tablet    Sig: Take 1 tablet (50 mg total) by mouth every 8 (eight) hours as needed.    Dispense:  120 tablet    Refill:  3  . levothyroxine (SYNTHROID, LEVOTHROID) 137 MCG tablet    Sig: Take 1 tablet (137 mcg total) by mouth daily before breakfast. TAKE 2 TABLETS (274 MCG TOTAL) BY MOUTH DAILY BEFORE BREAKFAST    Dispense:  180 tablet    Refill:  1  . insulin NPH Human (HUMULIN N) 100 UNIT/ML injection    Sig: Inject 0.15 mLs (15 Units total) into the skin at bedtime.    Dispense:  10 mL    Refill:  11    Keep on file for next refill    Medications Discontinued During This Encounter  Medication Reason  . amoxicillin-clavulanate (AUGMENTIN) 875-125 MG tablet Completed Course  . busPIRone (BUSPAR) 7.5 MG tablet Patient Preference  . levothyroxine (SYNTHROID, LEVOTHROID) 125 MCG tablet Reorder  . insulin NPH Human (HUMULIN N) 100 UNIT/ML injection Reorder    Follow-up: Return in about 6 months (around 09/07/2015) for follow up diabetes.   Crecencio Mc, MD

## 2015-03-11 ENCOUNTER — Other Ambulatory Visit: Payer: Self-pay | Admitting: Internal Medicine

## 2015-03-11 ENCOUNTER — Encounter: Payer: Self-pay | Admitting: Internal Medicine

## 2015-03-11 ENCOUNTER — Other Ambulatory Visit (INDEPENDENT_AMBULATORY_CARE_PROVIDER_SITE_OTHER): Payer: 59

## 2015-03-11 DIAGNOSIS — I10 Essential (primary) hypertension: Secondary | ICD-10-CM

## 2015-03-11 DIAGNOSIS — E119 Type 2 diabetes mellitus without complications: Secondary | ICD-10-CM | POA: Diagnosis not present

## 2015-03-11 DIAGNOSIS — E559 Vitamin D deficiency, unspecified: Secondary | ICD-10-CM

## 2015-03-11 DIAGNOSIS — E032 Hypothyroidism due to medicaments and other exogenous substances: Secondary | ICD-10-CM | POA: Diagnosis not present

## 2015-03-11 DIAGNOSIS — R5383 Other fatigue: Secondary | ICD-10-CM

## 2015-03-11 DIAGNOSIS — E890 Postprocedural hypothyroidism: Secondary | ICD-10-CM

## 2015-03-11 LAB — MICROALBUMIN / CREATININE URINE RATIO
Creatinine,U: 155.4 mg/dL
Microalb Creat Ratio: 0.5 mg/g (ref 0.0–30.0)
Microalb, Ur: <0.7 mg/dL (ref 0.0–1.9)

## 2015-03-11 LAB — T4, FREE: Free T4: 0.85 ng/dL (ref 0.60–1.60)

## 2015-03-11 LAB — LIPID PANEL
Cholesterol: 112 mg/dL (ref 0–200)
HDL: 47.1 mg/dL (ref >39.00)
LDL Cholesterol: 56 mg/dL (ref 0–99)
NonHDL: 64.8
Total CHOL/HDL Ratio: 2
Triglycerides: 45 mg/dL (ref 0.0–149.0)
VLDL: 9 mg/dL (ref 0.0–40.0)

## 2015-03-11 LAB — COMPREHENSIVE METABOLIC PANEL
ALT: 69 U/L — ABNORMAL HIGH (ref 0–53)
AST: 45 U/L — ABNORMAL HIGH (ref 0–37)
Albumin: 4.2 g/dL (ref 3.5–5.2)
Alkaline Phosphatase: 59 U/L (ref 39–117)
BUN: 11 mg/dL (ref 6–23)
CO2: 29 mEq/L (ref 19–32)
Calcium: 9 mg/dL (ref 8.4–10.5)
Chloride: 99 mEq/L (ref 96–112)
Creatinine, Ser: 0.89 mg/dL (ref 0.40–1.50)
GFR: 98.46 mL/min (ref >60.00)
Glucose, Bld: 212 mg/dL — ABNORMAL HIGH (ref 70–99)
Potassium: 3.8 mEq/L (ref 3.5–5.1)
Sodium: 136 mEq/L (ref 135–145)
Total Bilirubin: 0.8 mg/dL (ref 0.2–1.2)
Total Protein: 7.4 g/dL (ref 6.0–8.3)

## 2015-03-11 LAB — CBC WITH DIFFERENTIAL/PLATELET
Basophils Absolute: 0 10*3/uL (ref 0.0–0.1)
Basophils Relative: 0.5 % (ref 0.0–3.0)
Eosinophils Absolute: 0.1 10*3/uL (ref 0.0–0.7)
Eosinophils Relative: 1.5 % (ref 0.0–5.0)
HCT: 49 % (ref 39.0–52.0)
Hemoglobin: 16.5 g/dL (ref 13.0–17.0)
Lymphocytes Relative: 25.4 % (ref 12.0–46.0)
Lymphs Abs: 1.9 10*3/uL (ref 0.7–4.0)
MCHC: 33.6 g/dL (ref 30.0–36.0)
MCV: 90.8 fl (ref 78.0–100.0)
Monocytes Absolute: 0.6 10*3/uL (ref 0.1–1.0)
Monocytes Relative: 8.2 % (ref 3.0–12.0)
Neutro Abs: 4.7 10*3/uL (ref 1.4–7.7)
Neutrophils Relative %: 64.4 % (ref 43.0–77.0)
Platelets: 274 10*3/uL (ref 150.0–400.0)
RBC: 5.4 Mil/uL (ref 4.22–5.81)
RDW: 13.8 % (ref 11.5–15.5)
WBC: 7.3 10*3/uL (ref 4.0–10.5)

## 2015-03-11 LAB — VITAMIN D 25 HYDROXY (VIT D DEFICIENCY, FRACTURES): VITD: 20.62 ng/mL — ABNORMAL LOW (ref 30.00–100.00)

## 2015-03-11 LAB — HEMOGLOBIN A1C: Hgb A1c MFr Bld: 7.6 % — ABNORMAL HIGH (ref 4.6–6.5)

## 2015-03-11 LAB — TSH: TSH: 7.99 u[IU]/mL — ABNORMAL HIGH (ref 0.35–4.50)

## 2015-03-11 MED ORDER — INSULIN NPH (HUMAN) (ISOPHANE) 100 UNIT/ML ~~LOC~~ SUSP: 15.0000 [IU] | Freq: Every day | SUBCUTANEOUS | Status: DC

## 2015-03-11 MED ORDER — ERGOCALCIFEROL 1.25 MG (50000 UT) PO CAPS: 50000.0000 [IU] | ORAL_CAPSULE | ORAL | Status: DC

## 2015-03-11 MED ORDER — LEVOTHYROXINE SODIUM 137 MCG PO TABS: 137.0000 ug | ORAL_TABLET | Freq: Every day | ORAL | Status: DC

## 2015-03-11 NOTE — Assessment & Plan Note (Signed)

## 2015-03-11 NOTE — Assessment & Plan Note (Addendum)
Secondary to surgical resection for thyroid CA.  TSH is elevated but T4 is in range   Will increase dose from 250  Mcg to 274 mcg.    Lab Results  Component Value Date   TSH 7.99* 03/11/2015

## 2015-03-11 NOTE — Assessment & Plan Note (Addendum)
Loss of control since last visit .  continue januvia, metformin and increase NPH to 15 units at bedtime.   Lab Results  Component Value Date   HGBA1C 7.6* 03/11/2015

## 2015-03-11 NOTE — Assessment & Plan Note (Signed)
I have addressed  BMI and recommended a low glycemic index diet utilizing smaller more frequent meals to increase metabolism.  I have also recommended that patient start exercising with a goal of 30 minutes of aerobic exercise a minimum of 5 days per week. Screening for lipid disorders, thyroid and diabetes to be done today.   

## 2015-03-11 NOTE — Assessment & Plan Note (Signed)
Diastolic elevation noted today, but outside checks have been normal.  Lab Results  Component Value Date   CREATININE 0.89 03/11/2015   Lab Results  Component Value Date   NA 136 03/11/2015   K 3.8 03/11/2015   CL 99 03/11/2015   CO2 29 03/11/2015   Lab Results  Component Value Date   MICROALBUR <0.7 03/11/2015

## 2015-03-11 NOTE — Assessment & Plan Note (Addendum)
With intermittent sciatica down the left leg. Encouraged to lose weight. Continue tramadol and motrin

## 2015-05-08 ENCOUNTER — Ambulatory Visit: Payer: Self-pay

## 2015-06-12 ENCOUNTER — Ambulatory Visit: Payer: Self-pay

## 2015-06-18 DIAGNOSIS — Z1283 Encounter for screening for malignant neoplasm of skin: Secondary | ICD-10-CM | POA: Diagnosis not present

## 2015-06-18 DIAGNOSIS — L281 Prurigo nodularis: Secondary | ICD-10-CM | POA: Diagnosis not present

## 2015-06-18 DIAGNOSIS — D485 Neoplasm of uncertain behavior of skin: Secondary | ICD-10-CM | POA: Diagnosis not present

## 2015-08-12 ENCOUNTER — Other Ambulatory Visit: Payer: Self-pay | Admitting: Internal Medicine

## 2015-09-08 ENCOUNTER — Ambulatory Visit: Payer: Self-pay | Admitting: Internal Medicine

## 2015-09-08 ENCOUNTER — Encounter: Payer: Self-pay | Admitting: *Deleted

## 2015-09-08 ENCOUNTER — Telehealth: Payer: Self-pay | Admitting: Internal Medicine

## 2015-09-08 MED ORDER — LEVOTHYROXINE SODIUM 137 MCG PO TABS: ORAL_TABLET | ORAL | 1 refills | Status: DC

## 2015-09-08 NOTE — Telephone Encounter (Signed)
Pt needs a refill on levothyroxine (SYNTHROID, LEVOTHROID) 137 MCG tablet. Pt had to resch his appt due to provider out of the office.  Pharmacy is [facility], [address]  Call pt @ [phone]. Thank you!

## 2015-09-08 NOTE — Telephone Encounter (Signed)
Communication sent via MyChart

## 2015-09-08 NOTE — Telephone Encounter (Signed)
Refilling for 90 days  due to scheduling issues

## 2015-10-15 ENCOUNTER — Ambulatory Visit (INDEPENDENT_AMBULATORY_CARE_PROVIDER_SITE_OTHER): Payer: 59 | Admitting: Internal Medicine

## 2015-10-15 ENCOUNTER — Encounter: Payer: Self-pay | Admitting: Internal Medicine

## 2015-10-15 VITALS — BP 128/82 | HR 102 | Temp 98.5°F | Resp 16 | Ht 74.0 in | Wt 322.0 lb

## 2015-10-15 DIAGNOSIS — E1122 Type 2 diabetes mellitus with diabetic chronic kidney disease: Secondary | ICD-10-CM | POA: Diagnosis not present

## 2015-10-15 DIAGNOSIS — Z6841 Body Mass Index (BMI) 40.0 and over, adult: Secondary | ICD-10-CM

## 2015-10-15 DIAGNOSIS — N182 Chronic kidney disease, stage 2 (mild): Secondary | ICD-10-CM | POA: Diagnosis not present

## 2015-10-15 DIAGNOSIS — E119 Type 2 diabetes mellitus without complications: Secondary | ICD-10-CM | POA: Diagnosis not present

## 2015-10-15 DIAGNOSIS — G4733 Obstructive sleep apnea (adult) (pediatric): Secondary | ICD-10-CM

## 2015-10-15 DIAGNOSIS — IMO0002 Reserved for concepts with insufficient information to code with codable children: Secondary | ICD-10-CM

## 2015-10-15 DIAGNOSIS — J321 Chronic frontal sinusitis: Secondary | ICD-10-CM

## 2015-10-15 DIAGNOSIS — Z23 Encounter for immunization: Secondary | ICD-10-CM | POA: Diagnosis not present

## 2015-10-15 DIAGNOSIS — E1165 Type 2 diabetes mellitus with hyperglycemia: Secondary | ICD-10-CM

## 2015-10-15 DIAGNOSIS — E032 Hypothyroidism due to medicaments and other exogenous substances: Secondary | ICD-10-CM

## 2015-10-15 DIAGNOSIS — I10 Essential (primary) hypertension: Secondary | ICD-10-CM

## 2015-10-15 LAB — COMPREHENSIVE METABOLIC PANEL
ALT: 80 U/L — ABNORMAL HIGH (ref 0–53)
AST: 67 U/L — ABNORMAL HIGH (ref 0–37)
Albumin: 4.3 g/dL (ref 3.5–5.2)
Alkaline Phosphatase: 70 U/L (ref 39–117)
BUN: 9 mg/dL (ref 6–23)
CO2: 24 mEq/L (ref 19–32)
Calcium: 9 mg/dL (ref 8.4–10.5)
Chloride: 99 mEq/L (ref 96–112)
Creatinine, Ser: 0.79 mg/dL (ref 0.40–1.50)
GFR: 112.67 mL/min (ref >60.00)
Glucose, Bld: 282 mg/dL — ABNORMAL HIGH (ref 70–99)
Potassium: 3.9 mEq/L (ref 3.5–5.1)
Sodium: 134 mEq/L — ABNORMAL LOW (ref 135–145)
Total Bilirubin: 0.7 mg/dL (ref 0.2–1.2)
Total Protein: 7.7 g/dL (ref 6.0–8.3)

## 2015-10-15 LAB — HEMOGLOBIN A1C: Hgb A1c MFr Bld: 9.5 % — ABNORMAL HIGH (ref 4.6–6.5)

## 2015-10-15 LAB — LIPID PANEL
Cholesterol: 110 mg/dL (ref 0–200)
HDL: 44 mg/dL (ref >39.00)
LDL Cholesterol: 51 mg/dL (ref 0–99)
NonHDL: 65.73
Total CHOL/HDL Ratio: 2
Triglycerides: 75 mg/dL (ref 0.0–149.0)
VLDL: 15 mg/dL (ref 0.0–40.0)

## 2015-10-15 LAB — TSH: TSH: 3.14 u[IU]/mL (ref 0.35–4.50)

## 2015-10-15 LAB — MICROALBUMIN / CREATININE URINE RATIO
Creatinine,U: 61.1 mg/dL
Microalb Creat Ratio: 1.8 mg/g (ref 0.0–30.0)
Microalb, Ur: 1.1 mg/dL (ref 0.0–1.9)

## 2015-10-15 LAB — LDL CHOLESTEROL, DIRECT: Direct LDL: 60 mg/dL

## 2015-10-15 MED ORDER — LIRAGLUTIDE -WEIGHT MANAGEMENT 18 MG/3ML ~~LOC~~ SOPN: 0.6000 mg | PEN_INJECTOR | Freq: Every day | SUBCUTANEOUS | 0 refills | Status: DC

## 2015-10-15 MED ORDER — MELOXICAM 15 MG PO TABS
15.0000 mg | ORAL_TABLET | Freq: Every day | ORAL | 1 refills | Status: DC
Start: 2015-10-15 — End: 2016-05-10

## 2015-10-15 NOTE — Progress Notes (Signed)
Subjective:  Patient ID: Steven Hoover, male    DOB: [birth year]  Age: 45 y.o. MRN: [id number]  CC: The primary encounter diagnosis was Iatrogenic hypothyroidism. Diagnoses of Encounter for immunization, Diabetes mellitus without complication ([place]), Uncontrolled type 2 diabetes mellitus with stage 2 chronic kidney disease, without long-term current use of insulin ([place]), OSA (obstructive sleep apnea), Chronic frontal sinusitis, Morbid obesity with BMI of 40.0-44.9, adult ([place]), Hypothyroidism, iatrogenic, and Essential hypertension, benign were also pertinent to this visit.  HPI Steven Hoover presents for follow up on multiple acute and chronic issues inlcuding Type 2 DM with morbid obesity, hypothyroidisim Lab Results  Component Value Date   HGBA1C 9.5 (H) 10/15/2015   Lab Results  Component Value Date   TSH 3.14 10/15/2015  Thyroid total daily dose was increased last time  to 274 mcg   Cc: fatigue.  Untreated sleep apnea. Last sleep study 2 years ago,  Has tried various maskes.   Intolerant of all masks ,  Couldn't get a good seal even without a facial beard .  Has not seen ENT about this but having persistent sinus congestion  Requiring daily rinses,   Has tried flonase and anti histamine in the past , nothring has worked,  No sneezing, no allergic symptoms.   Wants to see Tami Ribas  DM:  Sugars running high .  fastings are 160 to 180 . NOt following a  Low carb diet. .  Weakness is tortilla chips and potato chips, has an eye exam In Dec [facility]   Has an 37 yr old son,  And 2 grown sons who are enlisted.  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  Occurs upon waking. Occurs when he sleeps on his side.  Not using tramadol except at night for low back pain and after mowing the yard.    Foot exam normal   Outpatient Medications  Prior to Visit  Medication Sig Dispense Refill  . Blood Glucose Monitoring Suppl (FREESTYLE FREEDOM LITE) W/DEVICE KIT Use to check sugars twice a day 1 each 0  . FREESTYLE LITE test strip Use twice daily 100 each 5  . glucose blood (FREESTYLE TEST STRIPS) test strip Use as instructed 100 each 12  . JANUVIA 100 MG tablet TAKE 1 TABLET BY MOUTH DAILY. 90 tablet 1  . Lancets (FREESTYLE) lancets Use as directed 100 each 12  . Lancets (FREESTYLE) lancets Use as instructed 100 each 12  . levothyroxine (SYNTHROID, LEVOTHROID) 137 MCG tablet TAKE 2 TABLETS (274 MCG TOTAL) BY MOUTH DAILY BEFORE BREAKFAST 180 tablet 1  . losartan-hydrochlorothiazide (HYZAAR) 50-12.5 MG tablet TAKE 1 TABLET BY MOUTH DAILY. 90 tablet 2  . metFORMIN (GLUCOPHAGE) 850 MG tablet TAKE 1 TABLET BY MOUTH 2 TIMES DAILY WITH A MEAL. 180 tablet 1  . traMADol (ULTRAM) 50 MG tablet Take 1 tablet (50 mg total) by mouth every 8 (eight) hours as needed. 120 tablet 3  . ibuprofen (ADVIL,MOTRIN) 200 MG tablet Take 400 mg by mouth every 6 (six) hours as needed for headache.    . ergocalciferol (DRISDOL) 50000 units capsule Take 1 capsule (50,000 Units total) by mouth once a week. (Patient not taking: Reported on 10/15/2015) 12 capsule 0  . fluticasone (FLONASE) 50 MCG/ACT nasal spray Place 2 sprays into both nostrils  daily.    . insulin NPH Human (HUMULIN N) 100 UNIT/ML injection Inject 0.15 mLs (15 Units total) into the skin at bedtime. (Patient not taking: Reported on 10/15/2015) 10 mL 11  . Lactobacillus (FLORAJEN ACIDOPHILUS) CAPS 1 tablet twice daily for 10 days  While on antibiotics.  pharmacy may choose any available alternative 30 capsule 0   No facility-administered medications prior to visit.     Review of Systems;  Patient denies fevers, malaise, unintentional weight loss, skin rash, eye pain, sinus congestion and sinus pain, sore throat, dysphagia,  hemoptysis , cough, dyspnea, wheezing, chest pain, palpitations, orthopnea,  edema, abdominal pain, nausea, melena, diarrhea, constipation, flank pain, dysuria, hematuria, urinary  Frequency, nocturia, numbness, tingling, seizures,  Focal weakness, Loss of consciousness,  Tremor, insomnia, depression, anxiety, and suicidal ideation.      Objective:  BP 128/82 (BP Location: Right Arm, Patient Position: Sitting, Cuff Size: Large)   Pulse (!) 102   Temp 98.5 F (36.9 C) (Oral)   Resp 16   Ht _0  (1.88 m)   Wt (!) 322 lb (146.1 kg)   SpO2 98%   BMI 41.34 kg/m   BP Readings from Last 3 Encounters:  10/15/15 128/82  03/10/15 (!) 120/94  02/06/15 136/86    Wt Readings from Last 3 Encounters:  10/15/15 (!) 322 lb (146.1 kg)  03/10/15 (!) 327 lb (148.3 kg)  02/06/15 (!) 326 lb 9.6 oz (148.1 kg)    General appearance: alert, cooperative and appears stated age Ears: normal TM's and external ear canals both ears Throat: lips, mucosa, and tongue normal; teeth and gums normal Neck: no adenopathy, no carotid bruit, supple, symmetrical, trachea midline and thyroid not enlarged, symmetric, no tenderness/mass/nodules Back: symmetric, no curvature. ROM normal. No CVA tenderness. Lungs: clear to auscultation bilaterally Heart: regular rate and rhythm, S1, S2 normal, no murmur, click, rub or gallop Abdomen: soft, non-tender; bowel sounds normal; no masses,  no organomegaly Pulses: 2+ and symmetric Skin: Skin color, texture, turgor normal. No rashes or lesions Lymph nodes: Cervical, supraclavicular, and axillary nodes normal.  Lab Results  Component Value Date   HGBA1C 9.5 (H) 10/15/2015   HGBA1C 7.6 (H) 03/11/2015   HGBA1C 6.0 08/02/2014    Lab Results  Component Value Date   CREATININE 0.79 10/15/2015   CREATININE 0.89 03/11/2015   CREATININE 0.86 08/02/2014    Lab Results  Component Value Date   WBC 7.3 03/11/2015   HGB 16.5 03/11/2015   HCT 49.0 03/11/2015   PLT 274.0 03/11/2015   GLUCOSE 282 (H) 10/15/2015   CHOL 110 10/15/2015   TRIG 75.0  10/15/2015   HDL 44.00 10/15/2015   LDLDIRECT 60.0 10/15/2015   LDLCALC 51 10/15/2015   ALT 80 (H) 10/15/2015   AST 67 (H) 10/15/2015   NA 134 (L) 10/15/2015   K 3.9 10/15/2015   CL 99 10/15/2015   CREATININE 0.79 10/15/2015   BUN 9 10/15/2015   CO2 24 10/15/2015   TSH 3.14 10/15/2015   HGBA1C 9.5 (H) 10/15/2015   MICROALBUR 1.1 10/15/2015    No results found.  Assessment & Plan:   Problem List Items Addressed This Visit    Morbid obesity with BMI of 40.0-44.9, adult ([place])    I have addressed  BMI and recommended a low glycemic index diet utilizing smaller more frequent meals to increase metabolism.  I have also recommended that patient start exercising with a goal of 30 minutes of aerobic exercise a minimum of 5 days per  week. Recommended use of Saxenda . rx given         Relevant Medications   Liraglutide -Weight Management (SAXENDA) 18 MG/3ML SOPN   Type II diabetes mellitus, uncontrolled ([place])    Continued Loss of control since last visit .  continue januvia, metformin and increase NPH to 20 units at bedtime.  More change once Saxenda use is reviewed.   Lab Results  Component Value Date   HGBA1C 9.5 (H) 10/15/2015           Hypothyroidism, iatrogenic    Secondary to surgical resection for thyroid CA.  TSH is finally in range  On 274 mcg.  No change  Today    Lab Results  Component Value Date   TSH 3.14 10/15/2015            OSA (obstructive sleep apnea)    Intolerant of CPAP.  Concurrent sinus congestion , chronic,  Refer to ENT for evaluation of both       Relevant Orders   Ambulatory referral to ENT   Essential hypertension, benign    Well controlled on current regimen. Renal function stable, no changes today.  Lab Results  Component Value Date   CREATININE 0.79 10/15/2015   Lab Results  Component Value Date   NA 134 (L) 10/15/2015   K 3.9 10/15/2015   CL 99 10/15/2015   CO2 24 10/15/2015          Other Visit Diagnoses     Iatrogenic hypothyroidism    -  Primary   Relevant Orders   T4 AND TSH   TSH (Completed)   T4 (Completed)   Encounter for immunization       Relevant Orders   Flu Vaccine QUAD 36+ mos IM (Completed)   Diabetes mellitus without complication ([facility])       Relevant Orders   Hemoglobin A1c (Completed)   Comprehensive metabolic panel (Completed)   Lipid panel (Completed)   Chronic frontal sinusitis       Relevant Orders   Ambulatory referral to ENT      I have discontinued Mr. Villarruel FLORAJEN ACIDOPHILUS, ibuprofen, fluticasone, and insulin NPH Human. I am also having him start on Liraglutide -Weight Management and meloxicam. Additionally, I am having him maintain his FREESTYLE LITE, freestyle, FREESTYLE FREEDOM LITE, glucose blood, freestyle, metFORMIN, traMADol, ergocalciferol, JANUVIA, losartan-hydrochlorothiazide, and levothyroxine.  Meds ordered this encounter  Medications  . Liraglutide -Weight Management (SAXENDA) 18 MG/3ML SOPN    Sig: Inject 0.6 mg into the skin daily. Inject 0.6 mg daily in to skin,  Increase weekly by 0.6 mg as tolerated to 3.0 mg maximum daily dose.    Dispense:  9 mL    Refill:  0  . meloxicam (MOBIC) 15 MG tablet    Sig: Take 1 tablet (15 mg total) by mouth daily.    Dispense:  90 tablet    Refill:  1    Medications Discontinued During This Encounter  Medication Reason  . Lactobacillus (FLORAJEN ACIDOPHILUS) CAPS Patient Discharge  . insulin NPH Human (HUMULIN N) 100 UNIT/ML injection Change in therapy  . fluticasone (FLONASE) 50 MCG/ACT nasal spray Patient Discharge  . ibuprofen (ADVIL,MOTRIN) 200 MG tablet     Follow-up: Return in about 3 months (around 01/14/2016) for follow up diabetes.   Crecencio Mc, MD

## 2015-10-15 NOTE — Patient Instructions (Signed)
I am recommending use of the medication called Saxenda to help you lose weight.  It is similar to the medicine that is used to treat diabetes called Victoza,  So It may lower your blood sugars . It is approved for long term use  It is injected daily in incrementally increasing doses (if tolerated,  Nausea usually resolves in a few days)"  0.6 mg daily for   Week 1 1.2 mg daily for Week 2 1.8 mg daily for Week 3 2.4 mg daily for  Week 4 3.0 mg daily for Week 5 and ongoing   If you want to bring the pen with you to be shown how to give yourself the dose,  We wil make you an  RN visit once you pick up your medication from your pharmacy

## 2015-10-15 NOTE — Progress Notes (Signed)
Pre visit review using our clinic review tool, if applicable. No additional management support is needed unless otherwise documented below in the visit note. 

## 2015-10-16 LAB — T4: T4, Total: 12.6 ug/dL — ABNORMAL HIGH (ref 4.5–12.0)

## 2015-10-17 ENCOUNTER — Telehealth: Payer: Self-pay

## 2015-10-17 ENCOUNTER — Encounter: Payer: Self-pay | Admitting: Internal Medicine

## 2015-10-17 NOTE — Assessment & Plan Note (Signed)
I have addressed  BMI and recommended a low glycemic index diet utilizing smaller more frequent meals to increase metabolism.  I have also recommended that patient start exercising with a goal of 30 minutes of aerobic exercise a minimum of 5 days per week. Recommended use of Saxenda . rx given

## 2015-10-17 NOTE — Assessment & Plan Note (Signed)
Continued Loss of control since last visit .  continue januvia, metformin and increase NPH to 20 units at bedtime.  More change once Saxenda use is reviewed.   Lab Results  Component Value Date   HGBA1C 9.5 (H) 10/15/2015

## 2015-10-17 NOTE — Telephone Encounter (Signed)
PA for Saxenda completed over the phone as the Key code was not correct. Approved from today 10/17/2015 to 02/16/2016.

## 2015-10-17 NOTE — Assessment & Plan Note (Signed)
Well controlled on current regimen. Renal function stable, no changes today.  Lab Results  Component Value Date   CREATININE 0.79 10/15/2015   Lab Results  Component Value Date   NA 134 (L) 10/15/2015   K 3.9 10/15/2015   CL 99 10/15/2015   CO2 24 10/15/2015

## 2015-10-17 NOTE — Assessment & Plan Note (Signed)
Secondary to surgical resection for thyroid CA.  TSH is finally in range  On 274 mcg.  No change  Today    Lab Results  Component Value Date   TSH 3.14 10/15/2015

## 2015-10-17 NOTE — Assessment & Plan Note (Signed)
Intolerant of CPAP.  Concurrent sinus congestion , chronic,  Refer to ENT for evaluation of both

## 2015-10-20 ENCOUNTER — Other Ambulatory Visit: Payer: Self-pay | Admitting: Internal Medicine

## 2015-10-20 MED ORDER — INSULIN GLARGINE 100 UNIT/ML SOLOSTAR PEN: 20.0000 [IU] | PEN_INJECTOR | Freq: Every day | SUBCUTANEOUS | 99 refills | Status: DC

## 2015-12-04 ENCOUNTER — Other Ambulatory Visit: Payer: Self-pay | Admitting: Internal Medicine

## 2015-12-08 ENCOUNTER — Encounter: Payer: Self-pay | Admitting: Internal Medicine

## 2015-12-08 ENCOUNTER — Other Ambulatory Visit: Payer: Self-pay | Admitting: Internal Medicine

## 2015-12-08 NOTE — Telephone Encounter (Signed)
Can fill saxenda?

## 2015-12-09 ENCOUNTER — Other Ambulatory Visit: Payer: Self-pay | Admitting: Internal Medicine

## 2015-12-09 MED ORDER — LIRAGLUTIDE -WEIGHT MANAGEMENT 18 MG/3ML ~~LOC~~ SOPN: 0.6000 mg | PEN_INJECTOR | Freq: Every day | SUBCUTANEOUS | 5 refills | Status: DC

## 2015-12-11 ENCOUNTER — Other Ambulatory Visit: Payer: Self-pay | Admitting: Internal Medicine

## 2016-01-16 ENCOUNTER — Ambulatory Visit: Payer: Self-pay | Admitting: Internal Medicine

## 2016-02-06 ENCOUNTER — Encounter: Payer: Self-pay | Admitting: Internal Medicine

## 2016-02-06 ENCOUNTER — Ambulatory Visit (INDEPENDENT_AMBULATORY_CARE_PROVIDER_SITE_OTHER): Payer: 59 | Admitting: Internal Medicine

## 2016-02-06 VITALS — BP 112/68 | HR 91 | Temp 98.4°F | Resp 16 | Ht 74.0 in | Wt 317.1 lb

## 2016-02-06 DIAGNOSIS — E032 Hypothyroidism due to medicaments and other exogenous substances: Secondary | ICD-10-CM | POA: Diagnosis not present

## 2016-02-06 DIAGNOSIS — E785 Hyperlipidemia, unspecified: Secondary | ICD-10-CM

## 2016-02-06 DIAGNOSIS — E1165 Type 2 diabetes mellitus with hyperglycemia: Secondary | ICD-10-CM

## 2016-02-06 DIAGNOSIS — Z85850 Personal history of malignant neoplasm of thyroid: Secondary | ICD-10-CM

## 2016-02-06 DIAGNOSIS — IMO0001 Reserved for inherently not codable concepts without codable children: Secondary | ICD-10-CM

## 2016-02-06 DIAGNOSIS — E1169 Type 2 diabetes mellitus with other specified complication: Secondary | ICD-10-CM

## 2016-02-06 DIAGNOSIS — K760 Fatty (change of) liver, not elsewhere classified: Secondary | ICD-10-CM

## 2016-02-06 DIAGNOSIS — Z6841 Body Mass Index (BMI) 40.0 and over, adult: Secondary | ICD-10-CM

## 2016-02-06 LAB — COMPREHENSIVE METABOLIC PANEL
ALT: 60 U/L — ABNORMAL HIGH (ref 0–53)
AST: 39 U/L — ABNORMAL HIGH (ref 0–37)
Albumin: 4.4 g/dL (ref 3.5–5.2)
Alkaline Phosphatase: 56 U/L (ref 39–117)
BUN: 11 mg/dL (ref 6–23)
CO2: 31 mEq/L (ref 19–32)
Calcium: 9.6 mg/dL (ref 8.4–10.5)
Chloride: 99 mEq/L (ref 96–112)
Creatinine, Ser: 0.85 mg/dL (ref 0.40–1.50)
GFR: 103.4 mL/min (ref >60.00)
Glucose, Bld: 106 mg/dL — ABNORMAL HIGH (ref 70–99)
Potassium: 4 mEq/L (ref 3.5–5.1)
Sodium: 138 mEq/L (ref 135–145)
Total Bilirubin: 0.7 mg/dL (ref 0.2–1.2)
Total Protein: 7.4 g/dL (ref 6.0–8.3)

## 2016-02-06 LAB — LIPID PANEL
Cholesterol: 80 mg/dL (ref 0–200)
HDL: 41.2 mg/dL (ref >39.00)
LDL Cholesterol: 31 mg/dL (ref 0–99)
NonHDL: 39.17
Total CHOL/HDL Ratio: 2
Triglycerides: 43 mg/dL (ref 0.0–149.0)
VLDL: 8.6 mg/dL (ref 0.0–40.0)

## 2016-02-06 LAB — LDL CHOLESTEROL, DIRECT: Direct LDL: 37 mg/dL

## 2016-02-06 LAB — HM DIABETES FOOT EXAM: HM Diabetic Foot Exam: NORMAL

## 2016-02-06 LAB — HEMOGLOBIN A1C: Hgb A1c MFr Bld: 6.2 % (ref 4.6–6.5)

## 2016-02-06 NOTE — Progress Notes (Signed)
Subjective:  Patient ID: Steven Hoover, male    DOB: [birth year]  Age: 46 y.o. MRN: [id number]  CC: The primary encounter diagnosis was Fatty liver disease, nonalcoholic. Diagnoses of Uncontrolled type 2 diabetes mellitus without complication, without long-term current use of insulin ([place]), Hypothyroidism, iatrogenic, Hyperlipidemia associated with type 2 diabetes mellitus ([place]), Morbid obesity with BMI of 40.0-44.9, adult ([place]), and History of thyroid cancer were also pertinent to this visit.  HPI Steven Hoover presents for 3 month follow up on uncontrolled diabetes.  Patient has no complaints today.  Patient is following a low glycemic index diet and taking all prescribed medications regularly without side effects.    Fasting sugars have been under less than 140 most of the time and post prandials have been under 160 except on rare occasions. 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  .  Patient has had an eye exam in the last 12 months and checks feet regularly for signs of infection.  Patient does not walk barefoot outside,  And denies an numbness tingling or burning in feet. Patient is up to date on all recommended vaccinations.    Lab Results  Component Value Date   HGBA1C 6.2 02/06/2016   Fasting     Outpatient Medications Prior to Visit  Medication Sig Dispense Refill  . B-D ULTRAFINE III SHORT PEN 31G X 8 MM MISC USE WITH PENS AS DIRECTED 100 each 3  . Blood Glucose Monitoring Suppl (FREESTYLE FREEDOM LITE) W/DEVICE KIT Use to check sugars twice a day 1 each 0  . FREESTYLE LITE test strip Use twice daily 100 each 5  . glucose blood (FREESTYLE TEST STRIPS) test strip Use as instructed 100 each 12  . Insulin Glargine (LANTUS SOLOSTAR) 100 UNIT/ML Solostar Pen Inject 20 Units into the skin daily at 10 pm. 5 pen PRN  . JANUVIA 100 MG tablet TAKE 1 TABLET BY MOUTH DAILY. 90 tablet 1  .  Lancets (FREESTYLE) lancets Use as directed 100 each 12  . Lancets (FREESTYLE) lancets Use as instructed 100 each 12  . levothyroxine (SYNTHROID, LEVOTHROID) 137 MCG tablet TAKE 2 TABLETS (274 MCG TOTAL) BY MOUTH DAILY BEFORE BREAKFAST 180 tablet 1  . Liraglutide -Weight Management (SAXENDA) 18 MG/3ML SOPN Inject 0.6 mg into the skin daily. Inject 3.0 mg daily daily in to skin, 15 mL 5  . losartan-hydrochlorothiazide (HYZAAR) 50-12.5 MG tablet TAKE 1 TABLET BY MOUTH DAILY. 90 tablet 2  . meloxicam (MOBIC) 15 MG tablet Take 1 tablet (15 mg total) by mouth daily. 90 tablet 1  . metFORMIN (GLUCOPHAGE) 850 MG tablet TAKE 1 TABLET BY MOUTH 2 TIMES DAILY WITH A MEAL. 180 tablet 1  . ergocalciferol (DRISDOL) 50000 units capsule Take 1 capsule (50,000 Units total) by mouth once a week. (Patient not taking: Reported on 10/15/2015) 12 capsule 0  . traMADol (ULTRAM) 50 MG tablet Take 1 tablet (50 mg total) by mouth every 8 (eight) hours as needed. 120 tablet 3   No facility-administered medications prior to visit.     Review of Systems;  Patient denies headache, fevers, malaise, unintentional weight loss, skin rash, eye pain, sinus congestion and sinus pain, sore throat, dysphagia,  hemoptysis , cough, dyspnea, wheezing, chest pain, palpitations, orthopnea, edema, abdominal pain, nausea, melena, diarrhea, constipation, flank pain, dysuria, hematuria, urinary  Frequency, nocturia, numbness, tingling, seizures,  Focal weakness, Loss of consciousness,  Tremor, insomnia,  depression, anxiety, and suicidal ideation.      Objective:  BP 112/68   Pulse 91   Temp 98.4 F (36.9 C) (Oral)   Resp 16   Ht '6\' 2"'$  (1.88 m)   Wt (!) 317 lb 2 oz (143.8 kg)   SpO2 98%   BMI 40.72 kg/m   BP Readings from Last 3 Encounters:  02/06/16 112/68  10/15/15 128/82  03/10/15 (!) 120/94    Wt Readings from Last 3 Encounters:  02/06/16 (!) 317 lb 2 oz (143.8 kg)  10/15/15 (!) 322 lb (146.1 kg)  03/10/15 (!) 327 lb  (148.3 kg)    General appearance: alert, cooperative and appears stated age Ears: normal TM's and external ear canals both ears Throat: lips, mucosa, and tongue normal; teeth and gums normal Neck: no adenopathy, no carotid bruit, supple, symmetrical, trachea midline and thyroid not enlarged, symmetric, no tenderness/mass/nodules Back: symmetric, no curvature. ROM normal. No CVA tenderness. Lungs: clear to auscultation bilaterally Heart: regular rate and rhythm, S1, S2 normal, no murmur, click, rub or gallop Abdomen: soft, non-tender; bowel sounds normal; no masses,  no organomegaly Pulses: 2+ and symmetric Skin: Skin color, texture, turgor normal. No rashes or lesions Lymph nodes: Cervical, supraclavicular, and axillary nodes normal.  Lab Results  Component Value Date   HGBA1C 6.2 02/06/2016   HGBA1C 9.5 (H) 10/15/2015   HGBA1C 7.6 (H) 03/11/2015    Lab Results  Component Value Date   CREATININE 0.85 02/06/2016   CREATININE 0.79 10/15/2015   CREATININE 0.89 03/11/2015    Lab Results  Component Value Date   WBC 7.3 03/11/2015   HGB 16.5 03/11/2015   HCT 49.0 03/11/2015   PLT 274.0 03/11/2015   GLUCOSE 106 (H) 02/06/2016   CHOL 80 02/06/2016   TRIG 43.0 02/06/2016   HDL 41.20 02/06/2016   LDLDIRECT 37.0 02/06/2016   LDLCALC 31 02/06/2016   ALT 60 (H) 02/06/2016   AST 39 (H) 02/06/2016   NA 138 02/06/2016   K 4.0 02/06/2016   CL 99 02/06/2016   CREATININE 0.85 02/06/2016   BUN 11 02/06/2016   CO2 31 02/06/2016   TSH 1.120 02/06/2016   HGBA1C 6.2 02/06/2016   MICROALBUR 1.1 10/15/2015    No results found.  Assessment & Plan:   Problem List Items Addressed This Visit    Fatty liver disease, nonalcoholic - Primary   Relevant Orders   Comprehensive metabolic panel (Completed)   History of thyroid cancer    He is s/p  remote thyroidectomy for tyroid  cancer , and the histology is unclear.  Since saxenda is contraindicated in patientss with a history of medullary  thyroid cancer, we will suspend the medication until records can be received from [facility] oncology       Hypothyroidism, iatrogenic   Relevant Orders   T4 AND TSH (Completed)   Morbid obesity with BMI of 40.0-44.9, adult ([place])    I have congratulated him in reduction of   BMI and encouraged  Continued weight loss with goal of 10% of body weigh over the next 6 months using a low glycemic index diet and regular exercise a minimum of 5 days per week.   continue Saxenda for long term weight management.       Type II diabetes mellitus, uncontrolled ([place])    Regain of control since last visit .  continue januvia, metformin and Lantus 20 units at  bedtime.   Lab Results  Component Value Date   HGBA1C 6.2 02/06/2016  Relevant Orders   Hemoglobin A1c (Completed)   Lipid panel (Completed)    Other Visit Diagnoses    Hyperlipidemia associated with type 2 diabetes mellitus ([place])       Relevant Orders   LDL cholesterol, direct (Completed)      I have discontinued Steven Hoover traMADol and ergocalciferol. I am also having him maintain his FREESTYLE LITE, freestyle, FREESTYLE FREEDOM LITE, glucose blood, freestyle, metFORMIN, JANUVIA, losartan-hydrochlorothiazide, levothyroxine, meloxicam, Insulin Glargine, Liraglutide -Weight Management, and B-D ULTRAFINE III SHORT PEN.  No orders of the defined types were placed in this encounter.   Medications Discontinued During This Encounter  Medication Reason  . ergocalciferol (DRISDOL) 50000 units capsule Completed Course  . traMADol (ULTRAM) 50 MG tablet Completed Course    Follow-up: Return in about 3 months (around 05/06/2016) for follow up diabetes, 90 +DAYS .   Crecencio Mc, MD

## 2016-02-06 NOTE — Patient Instructions (Signed)
Diabetes Mellitus and Exercise Exercising regularly is important for your overall health, especially when you have diabetes (diabetes mellitus). Exercising is not only about losing weight. It has many health benefits, such as increasing muscle strength and bone density and reducing body fat and stress. This leads to improved fitness, flexibility, and endurance, all of which result in better overall health. Exercise has additional benefits for people with diabetes, including:  Reducing appetite.  Helping to lower and control blood glucose.  Lowering blood pressure.  Helping to control amounts of fatty substances (lipids) in the blood, such as cholesterol and triglycerides.  Helping the body to respond better to insulin (improving insulin sensitivity).  Reducing how much insulin the body needs.  Decreasing the risk for heart disease by:  Lowering cholesterol and triglyceride levels.  Increasing the levels of good cholesterol.  Lowering blood glucose levels. What is my activity plan? Your health care provider or certified diabetes educator can help you make a plan for the type and frequency of exercise (activity plan) that works for you. Make sure that you:  Do at least 150 minutes of moderate-intensity or vigorous-intensity exercise each week. This could be brisk walking, biking, or water aerobics.  Do stretching and strength exercises, such as yoga or weightlifting, at least 2 times a week.  Spread out your activity over at least 3 days of the week.  Get some form of physical activity every day.  Do not go more than 2 days in a row without some kind of physical activity.  Avoid being inactive for more than 90 minutes at a time. Take frequent breaks to walk or stretch.  Choose a type of exercise or activity that you enjoy, and set realistic goals.  Start slowly, and gradually increase the intensity of your exercise over time. What do I need to know about managing my  diabetes?  Check your blood glucose before and after exercising.  If your blood glucose is higher than 240 mg/dL (13.3 mmol/L) before you exercise, check your urine for ketones. If you have ketones in your urine, do not exercise until your blood glucose returns to normal.  Know the symptoms of low blood glucose (hypoglycemia) and how to treat it. Your risk for hypoglycemia increases during and after exercise. Common symptoms of hypoglycemia can include:  Hunger.  Anxiety.  Sweating and feeling clammy.  Confusion.  Dizziness or feeling light-headed.  Increased heart rate or palpitations.  Blurry vision.  Tingling or numbness around the mouth, lips, or tongue.  Tremors or shakes.  Irritability.  Keep a rapid-acting carbohydrate snack available before, during, and after exercise to help prevent or treat hypoglycemia.  Avoid injecting insulin into areas of the body that are going to be exercised. For example, avoid injecting insulin into:  The arms, when playing tennis.  The legs, when jogging.  Keep records of your exercise habits. Doing this can help you and your health care provider adjust your diabetes management plan as needed. Write down:  Food that you eat before and after you exercise.  Blood glucose levels before and after you exercise.  The type and amount of exercise you have done.  When your insulin is expected to peak, if you use insulin. Avoid exercising at times when your insulin is peaking.  When you start a new exercise or activity, work with your health care provider to make sure the activity is safe for you, and to adjust your insulin, medicines, or food intake as needed.  Drink plenty   of water while you exercise to prevent dehydration or heat stroke. Drink enough fluid to keep your urine clear or pale yellow. This information is not intended to replace advice given to you by your health care provider. Make sure you discuss any questions you have with  your health care provider. Document Released: 04/10/2003 Document Revised: 08/08/2015 Document Reviewed: 06/30/2015 Elsevier Interactive Patient Education  2017 [employer]  

## 2016-02-07 LAB — T4 AND TSH
T4, Total: 11.8 ug/dL (ref 4.5–12.0)
TSH: 1.12 u[IU]/mL (ref 0.450–4.500)

## 2016-02-08 ENCOUNTER — Encounter: Payer: Self-pay | Admitting: Internal Medicine

## 2016-02-08 DIAGNOSIS — Z85850 Personal history of malignant neoplasm of thyroid: Secondary | ICD-10-CM | POA: Insufficient documentation

## 2016-02-08 NOTE — Assessment & Plan Note (Signed)
He is s/p  remote thyroidectomy for tyroid  cancer , and the histology is unclear.  Since saxenda is contraindicated in patientss with a history of medullary thyroid cancer, we will suspend the medication until records can be received from [facility] oncology

## 2016-02-08 NOTE — Assessment & Plan Note (Signed)
Regain of control since last visit .  continue januvia, metformin and Lantus 20 units at  bedtime.   Lab Results  Component Value Date   HGBA1C 6.2 02/06/2016

## 2016-02-08 NOTE — Assessment & Plan Note (Addendum)
I have congratulated him in reduction of   BMI and encouraged  Continued weight loss with goal of 10% of body weigh over the next 6 months using a low glycemic index diet and regular exercise a minimum of 5 days per week.   continue Saxenda for long term weight management.

## 2016-02-09 NOTE — Progress Notes (Signed)
Mr. Thelin did not start his saxenda. He did receive your message on my chart. He was seen by  Dr. Jeb Levering at [facility] in 2007. His thyroid was completely removed. Patient will respond to the my chart message.

## 2016-02-10 ENCOUNTER — Telehealth: Payer: Self-pay

## 2016-02-10 ENCOUNTER — Other Ambulatory Visit: Payer: Self-pay | Admitting: Internal Medicine

## 2016-02-10 MED ORDER — NALTREXONE-BUPROPION HCL ER 8-90 MG PO TB12: ORAL_TABLET | ORAL | 0 refills | Status: DC

## 2016-02-10 NOTE — Telephone Encounter (Signed)
LMOM for [facility] to fax over reports for 2007 Thyroid cancer

## 2016-02-13 ENCOUNTER — Telehealth: Payer: Self-pay | Admitting: Internal Medicine

## 2016-02-13 NOTE — Telephone Encounter (Signed)
PA started for Contrave on cover my meds.

## 2016-02-20 ENCOUNTER — Telehealth: Payer: Self-pay | Admitting: Internal Medicine

## 2016-02-20 ENCOUNTER — Ambulatory Visit: Payer: Self-pay | Admitting: Internal Medicine

## 2016-02-20 NOTE — Telephone Encounter (Signed)
Steven Hoover [id number] called from [employer] regarding PA for medication Contrace. 1. What pt bmi is or current height and weight? 2, Is pt actively enrolled in an exercise or caloric reduction program or weight loss behavioral modification program? If so specify what program. Thank you!

## 2016-02-20 NOTE — Telephone Encounter (Signed)
I received the records from Dr Oliva Bustard and confirmed that he did NOT HAVE THE TYPE of thyroid cancer that makes Saxenda risky,  I also spoke with our endocrinologist Dr Renne Crigler, for her opinion and she supports using the medication, and ABSOLUTELY  NO CASES OF CANCER have been reported in human patients taking this medication, so if he has picked up the medication, he can start using it.

## 2016-02-20 NOTE — Telephone Encounter (Signed)
Lmom for patient to call back 

## 2016-02-20 NOTE — Telephone Encounter (Signed)
Called and left detailed message for Steven Hoover that patient BMI 40.72 and that patient is a carb restricted diet and exercise program.

## 2016-02-23 ENCOUNTER — Telehealth: Payer: Self-pay | Admitting: *Deleted

## 2016-02-23 ENCOUNTER — Telehealth: Payer: Self-pay

## 2016-02-23 NOTE — Telephone Encounter (Signed)
Patient is complainig of back pain on right side shooting down leg and ankles. Would like a recommendation of what he needs to do. Please advise

## 2016-02-23 NOTE — Telephone Encounter (Signed)
I need more info,  How long has it been going on,  Did he have a fall?  Etc

## 2016-02-23 NOTE — Telephone Encounter (Signed)
Patient notified

## 2016-02-23 NOTE — Telephone Encounter (Signed)
Pt requested a returned call, following upon a call from 01/19  Pt contact [phone]

## 2016-02-24 ENCOUNTER — Telehealth: Payer: Self-pay | Admitting: Internal Medicine

## 2016-02-24 MED ORDER — PREDNISONE 10 MG PO TABS: ORAL_TABLET | ORAL | 0 refills | Status: DC

## 2016-02-24 MED ORDER — TRAMADOL HCL 50 MG PO TABS: 50.0000 mg | ORAL_TABLET | Freq: Four times a day (QID) | ORAL | 0 refills | Status: DC | PRN

## 2016-02-24 MED ORDER — CYCLOBENZAPRINE HCL 10 MG PO TABS: 10.0000 mg | ORAL_TABLET | Freq: Three times a day (TID) | ORAL | 0 refills | Status: DC | PRN

## 2016-02-24 NOTE — Telephone Encounter (Signed)
Spoke to patient, notified of medication. Will wait on tramadol, this afternoon. 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 Will need a note for 1/23 and 1/24

## 2016-02-24 NOTE — Telephone Encounter (Addendum)
Sounds like he has a herniated disk and/or muscle spasms  Due to long drive,  Will treat with prednisone taper , muscle relaxers and  Tramadol,  Will send all to pharmacy,  If not improved in 48 hours,  Needs to be seen . iw ill ot be able to sign the tramadol until this afternoon.   Will he need a work note?

## 2016-02-24 NOTE — Telephone Encounter (Signed)
Pt called and stated that he is getting worse and would like to know what he should do.   Call pt @ [phone]

## 2016-02-24 NOTE — Telephone Encounter (Signed)
Patient letter and Rx printed Waiting for provider signature

## 2016-02-24 NOTE — Addendum Note (Signed)
Addended by: Crecencio Mc on: 02/24/2016 09:25 AM   Modules accepted: Orders

## 2016-02-24 NOTE — Addendum Note (Signed)
Addended by: Valere Dross on: 02/24/2016 12:26 PM   Modules accepted: Orders

## 2016-02-24 NOTE — Telephone Encounter (Signed)
Pain started 3 am 02/23/15, pain in back, unable to move, has not done lift  anything he know that may had cause his problem. He went to visit his son, [place], driving total of 10, Numbness and tingling in toes and 4th and 5th digit in hand. Describes has sharp pain Lower back. Pain radiates down leg. Patient currently taking Ibuprofen and Tylenol, and Heat PAD

## 2016-02-24 NOTE — Telephone Encounter (Signed)
Letter drafted in chart ,  please print

## 2016-02-24 NOTE — Telephone Encounter (Signed)
Patient notified

## 2016-02-25 ENCOUNTER — Telehealth: Payer: Self-pay | Admitting: Internal Medicine

## 2016-02-25 NOTE — Telephone Encounter (Signed)
Rx was refaxed at 4:45 to correct fax number by Juliann Pulse.

## 2016-02-25 NOTE — Telephone Encounter (Signed)
Pt called and stated that the pharmacy still does not have his medication. Can you please resend traMADol (ULTRAM) 50 MG tablet.   Pharmacy - [place], [place]

## 2016-03-03 ENCOUNTER — Encounter: Payer: Self-pay | Admitting: Internal Medicine

## 2016-03-05 ENCOUNTER — Encounter: Payer: Self-pay | Admitting: Internal Medicine

## 2016-03-05 ENCOUNTER — Other Ambulatory Visit: Payer: Self-pay | Admitting: Internal Medicine

## 2016-03-08 ENCOUNTER — Encounter: Payer: Self-pay | Admitting: Internal Medicine

## 2016-03-08 ENCOUNTER — Other Ambulatory Visit: Payer: Self-pay | Admitting: Internal Medicine

## 2016-03-29 ENCOUNTER — Encounter: Payer: Self-pay | Admitting: Internal Medicine

## 2016-03-29 DIAGNOSIS — M5386 Other specified dorsopathies, lumbar region: Secondary | ICD-10-CM

## 2016-04-06 ENCOUNTER — Ambulatory Visit (HOSPITAL_COMMUNITY): Payer: 59

## 2016-04-14 ENCOUNTER — Ambulatory Visit (HOSPITAL_COMMUNITY): Payer: 59

## 2016-04-22 ENCOUNTER — Ambulatory Visit
Admission: RE | Admit: 2016-04-22 | Discharge: 2016-04-22 | Disposition: A | Discharge status: Home / Self Care | Payer: 59 | Source: Ambulatory Visit | Attending: Internal Medicine | Admitting: Internal Medicine

## 2016-04-22 DIAGNOSIS — M48061 Spinal stenosis, lumbar region without neurogenic claudication: Secondary | ICD-10-CM | POA: Insufficient documentation

## 2016-04-22 DIAGNOSIS — M5386 Other specified dorsopathies, lumbar region: Secondary | ICD-10-CM

## 2016-04-22 DIAGNOSIS — M545 Low back pain: Secondary | ICD-10-CM | POA: Diagnosis not present

## 2016-04-22 DIAGNOSIS — M5442 Lumbago with sciatica, left side: Secondary | ICD-10-CM | POA: Diagnosis present

## 2016-04-25 ENCOUNTER — Encounter: Payer: Self-pay | Admitting: Internal Medicine

## 2016-04-25 DIAGNOSIS — Q7649 Other congenital malformations of spine, not associated with scoliosis: Secondary | ICD-10-CM | POA: Insufficient documentation

## 2016-05-07 ENCOUNTER — Ambulatory Visit: Payer: Self-pay | Admitting: Internal Medicine

## 2016-05-10 ENCOUNTER — Encounter: Payer: Self-pay | Admitting: Internal Medicine

## 2016-05-10 ENCOUNTER — Ambulatory Visit (INDEPENDENT_AMBULATORY_CARE_PROVIDER_SITE_OTHER): Payer: 59 | Admitting: Internal Medicine

## 2016-05-10 VITALS — BP 114/86 | HR 86 | Temp 98.1°F | Resp 16 | Ht 74.0 in | Wt 317.6 lb

## 2016-05-10 DIAGNOSIS — E1165 Type 2 diabetes mellitus with hyperglycemia: Secondary | ICD-10-CM | POA: Diagnosis not present

## 2016-05-10 DIAGNOSIS — E032 Hypothyroidism due to medicaments and other exogenous substances: Secondary | ICD-10-CM | POA: Diagnosis not present

## 2016-05-10 DIAGNOSIS — K760 Fatty (change of) liver, not elsewhere classified: Secondary | ICD-10-CM | POA: Diagnosis not present

## 2016-05-10 DIAGNOSIS — IMO0001 Reserved for inherently not codable concepts without codable children: Secondary | ICD-10-CM

## 2016-05-10 DIAGNOSIS — M48061 Spinal stenosis, lumbar region without neurogenic claudication: Secondary | ICD-10-CM

## 2016-05-10 DIAGNOSIS — Q7649 Other congenital malformations of spine, not associated with scoliosis: Secondary | ICD-10-CM

## 2016-05-10 DIAGNOSIS — Z6841 Body Mass Index (BMI) 40.0 and over, adult: Secondary | ICD-10-CM

## 2016-05-10 LAB — LIPID PANEL
Cholesterol: 118 mg/dL (ref 0–200)
HDL: 50.4 mg/dL (ref >39.00)
LDL Cholesterol: 54 mg/dL (ref 0–99)
NonHDL: 68.06
Total CHOL/HDL Ratio: 2
Triglycerides: 71 mg/dL (ref 0.0–149.0)
VLDL: 14.2 mg/dL (ref 0.0–40.0)

## 2016-05-10 LAB — COMPREHENSIVE METABOLIC PANEL
ALT: 51 U/L (ref 0–53)
AST: 38 U/L — ABNORMAL HIGH (ref 0–37)
Albumin: 4.4 g/dL (ref 3.5–5.2)
Alkaline Phosphatase: 59 U/L (ref 39–117)
BUN: 9 mg/dL (ref 6–23)
CO2: 30 mEq/L (ref 19–32)
Calcium: 9.5 mg/dL (ref 8.4–10.5)
Chloride: 101 mEq/L (ref 96–112)
Creatinine, Ser: 0.84 mg/dL (ref 0.40–1.50)
GFR: 104.7 mL/min (ref >60.00)
Glucose, Bld: 142 mg/dL — ABNORMAL HIGH (ref 70–99)
Potassium: 4 mEq/L (ref 3.5–5.1)
Sodium: 137 mEq/L (ref 135–145)
Total Bilirubin: 0.7 mg/dL (ref 0.2–1.2)
Total Protein: 7.7 g/dL (ref 6.0–8.3)

## 2016-05-10 LAB — POCT GLYCOSYLATED HEMOGLOBIN (HGB A1C): Hemoglobin A1C: 8.4

## 2016-05-10 MED ORDER — LIRAGLUTIDE 18 MG/3ML ~~LOC~~ SOPN: PEN_INJECTOR | SUBCUTANEOUS | 2 refills | Status: DC

## 2016-05-10 MED ORDER — CELECOXIB 200 MG PO CAPS: 200.0000 mg | ORAL_CAPSULE | Freq: Two times a day (BID) | ORAL | 2 refills | Status: DC

## 2016-05-10 NOTE — Progress Notes (Signed)
Pre visit review using our clinic review tool, if applicable. No additional management support is needed unless otherwise documented below in the visit note. 

## 2016-05-10 NOTE — Progress Notes (Signed)
Subjective:  Patient ID: Steven Hoover, male    DOB: 12/09/1970  Age: 46 y.o. MRN: [id number]  CC: The primary encounter diagnosis was Spinal stenosis of lumbar region without neurogenic claudication. Diagnoses of Uncontrolled type 2 diabetes mellitus without complication, without long-term current use of insulin ([place]), Fatty liver disease, nonalcoholic, Hypothyroidism, iatrogenic, Morbid obesity with BMI of 40.0-44.9, adult ([employer]), and Congenital spinal stenosis of lumbar region were also pertinent to this visit.  HPI Steven Hoover presents for follow up on uncontrolled diabetes  He has not been compliant with diet or need for exercise. Notes that he was better controlled when [facility], which required twice daily cbgs  that were reviewed daily by an RN and acted upon   Stopped metform due to diarrhea Uses Januvia and Lantus 10 units in the evening    Last a1c was 6.2 in January   Lab Results  Component Value Date   HGBA1C 8.4 05/10/2016   fastings usually 130   Right sided back pain aggravated by walking .  MRI Lumbar spine March 22 reviewed. Mild to moderate stenosis at l4-5 improved since 2011,  And l>4 subarticular stenis same level   Discussed referral to [facility].    Outpatient Medications Prior to Visit  Medication Sig Dispense Refill  . B-D ULTRAFINE III SHORT PEN 31G X 8 MM MISC USE WITH PENS AS DIRECTED 100 each 3  . Blood Glucose Monitoring Suppl (FREESTYLE FREEDOM LITE) W/DEVICE KIT Use to check sugars twice a day 1 each 0  . FREESTYLE LITE test strip Use twice daily 100 each 5  . glucose blood (FREESTYLE TEST STRIPS) test strip Use as instructed 100 each 12  . Insulin Glargine (LANTUS SOLOSTAR) 100 UNIT/ML Solostar Pen Inject 20 Units into the skin daily at 10 pm. 5 pen PRN  . JANUVIA 100 MG tablet TAKE 1 TABLET BY MOUTH DAILY. 90 tablet 0  . Lancets (FREESTYLE) lancets Use as directed 100 each 12  . levothyroxine (SYNTHROID,  LEVOTHROID) 137 MCG tablet TAKE 2 TABLETS BY MOUTH DAILY BEFORE BREAKFAST 180 tablet 1  . losartan-hydrochlorothiazide (HYZAAR) 50-12.5 MG tablet TAKE 1 TABLET BY MOUTH DAILY. 90 tablet 2  . cyclobenzaprine (FLEXERIL) 10 MG tablet Take 1 tablet (10 mg total) by mouth 3 (three) times daily as needed for muscle spasms. (Patient not taking: Reported on 05/10/2016) 30 tablet 0  . Lancets (FREESTYLE) lancets Use as instructed (Patient not taking: Reported on 05/10/2016) 100 each 12  . meloxicam (MOBIC) 15 MG tablet Take 1 tablet (15 mg total) by mouth daily. (Patient not taking: Reported on 05/10/2016) 90 tablet 1  . metFORMIN (GLUCOPHAGE) 850 MG tablet TAKE 1 TABLET BY MOUTH 2 TIMES DAILY WITH A MEAL. (Patient not taking: Reported on 05/10/2016) 180 tablet 1  . Naltrexone-Bupropion HCl ER 8-90 MG TB12 One tablet every morning for one week, then twice daily for one week.. Increase gradually to 2 tablets twice daily (Patient not taking: Reported on 05/10/2016) 120 tablet 0  . predniSONE (DELTASONE) 10 MG tablet 6 tablets on Day 1 , then reduce by 1 tablet daily until gone (Patient not taking: Reported on 05/10/2016) 21 tablet 0  . traMADol (ULTRAM) 50 MG tablet Take 1 tablet (50 mg total) by mouth every 6 (six) hours as needed. (Patient not taking: Reported on 05/10/2016) 120 tablet 0   No facility-administered medications prior to visit.     Review of Systems;  Patient denies headache, fevers, malaise, unintentional weight  loss, skin rash, eye pain, sinus congestion and sinus pain, sore throat, dysphagia,  hemoptysis , cough, dyspnea, wheezing, chest pain, palpitations, orthopnea, edema, abdominal pain, nausea, melena, diarrhea, constipation, flank pain, dysuria, hematuria, urinary  Frequency, nocturia, numbness, tingling, seizures,  Focal weakness, Loss of consciousness,  Tremor, insomnia, depression, anxiety, and suicidal ideation.      Objective:  BP 114/86   Pulse 86   Temp 98.1 F (36.7 C) (Oral)   Resp 16    Ht 6' 2"  (1.88 m)   Wt (!) 317 lb 9.6 oz (144.1 kg)   SpO2 99%   BMI 40.78 kg/m   BP Readings from Last 3 Encounters:  05/10/16 114/86  02/06/16 112/68  10/15/15 128/82    Wt Readings from Last 3 Encounters:  05/10/16 (!) 317 lb 9.6 oz (144.1 kg)  02/06/16 (!) 317 lb 2 oz (143.8 kg)  10/15/15 (!) 322 lb (146.1 kg)    General appearance: alert, cooperative and appears stated age Ears: normal TM's and external ear canals both ears Throat: lips, mucosa, and tongue normal; teeth and gums normal Neck: no adenopathy, no carotid bruit, supple, symmetrical, trachea midline and thyroid not enlarged, symmetric, no tenderness/mass/nodules Back: symmetric, no curvature. ROM normal. No CVA tenderness. Lungs: clear to auscultation bilaterally Heart: regular rate and rhythm, S1, S2 normal, no murmur, click, rub or gallop Abdomen: soft, non-tender; bowel sounds normal; no masses,  no organomegaly Pulses: 2+ and symmetric Skin: Skin color, texture, turgor normal. No rashes or lesions Lymph nodes: Cervical, supraclavicular, and axillary nodes normal.  Lab Results  Component Value Date   HGBA1C 8.4 05/10/2016   HGBA1C 6.2 02/06/2016   HGBA1C 9.5 (H) 10/15/2015    Lab Results  Component Value Date   CREATININE 0.84 05/10/2016   CREATININE 0.85 02/06/2016   CREATININE 0.79 10/15/2015    Lab Results  Component Value Date   WBC 7.3 03/11/2015   HGB 16.5 03/11/2015   HCT 49.0 03/11/2015   PLT 274.0 03/11/2015   GLUCOSE 142 (H) 05/10/2016   CHOL 118 05/10/2016   TRIG 71.0 05/10/2016   HDL 50.40 05/10/2016   LDLDIRECT 37.0 02/06/2016   LDLCALC 54 05/10/2016   ALT 51 05/10/2016   AST 38 (H) 05/10/2016   NA 137 05/10/2016   K 4.0 05/10/2016   CL 101 05/10/2016   CREATININE 0.84 05/10/2016   BUN 9 05/10/2016   CO2 30 05/10/2016   TSH 0.463 05/10/2016   HGBA1C 8.4 05/10/2016   MICROALBUR 1.1 10/15/2015    Mr Lumbar Spine Wo Contrast  Result Date: 04/22/2016 CLINICAL DATA:   Low back pain with sciatica. Left leg pain and numbness. EXAM: MRI LUMBAR SPINE WITHOUT CONTRAST TECHNIQUE: Multiplanar, multisequence MR imaging of the lumbar spine was performed. No intravenous contrast was administered. COMPARISON:  MRI 05/21/2009 FINDINGS: Segmentation:  Normal Alignment:  Normal Vertebrae: Negative for fracture or mass. Mild discogenic changes at L4-5. Conus medullaris: Extends to the L1-2 level and appears normal. Paraspinal and other soft tissues: Negative Disc levels: L1-2:  Negative L2-3:  Negative L3-4: Mild disc and facet degeneration with mild spinal stenosis. There is an element of mild congenital stenosis of the canal. L4-5: Disc degeneration with disc space narrowing. Disc bulging and diffuse endplate spurring similar to the prior study. Improvement in small central disc protrusion since the prior study. Improvement in spinal stenosis. There is mild to moderate spinal stenosis. Mild facet degeneration. Mild subarticular stenosis left greater than right. L5-S1:  Negative IMPRESSION: Mild to moderate  spinal stenosis at L4-5 with improvement since 2011. Mild subarticular stenosis at L4-5 left greater than right Mild spinal stenosis L3-4. Electronically Signed   By: Franchot Gallo M.D.   On: 04/22/2016 08:50    Assessment & Plan:   Problem List Items Addressed This Visit    Congenital spinal stenosis of lumbar region    nonsurgical by repeat MRI.  Referral to [facility],  advised to increase tramadol to 2 tablets every 12 hours and adding celebrex       Fatty liver disease, nonalcoholic    Workup for causes has  included ultrasound which suggested fatty liver,  And negative serologies for autoimmune etiologies . Current liver enzymes are treanding down and all modifiable risk factors including obesity, diabetes and hyperlipidemia have been addressed   Lab Results  Component Value Date   ALT 51 05/10/2016   AST 38 (H) 05/10/2016   ALKPHOS 59 05/10/2016   BILITOT 0.7  05/10/2016   Lab Results  Component Value Date   CHOL 118 05/10/2016   HDL 50.40 05/10/2016   LDLCALC 54 05/10/2016   LDLDIRECT 37.0 02/06/2016   TRIG 71.0 05/10/2016   CHOLHDL 2 05/10/2016           Relevant Orders   Comprehensive metabolic panel (Completed)   Hypothyroidism, iatrogenic    Thyroid function is WNL on current dose.  No current changes needed.   Lab Results  Component Value Date   TSH 0.463 05/10/2016        Relevant Orders   T4 AND TSH (Completed)   Morbid obesity with BMI of 40.0-44.9, adult ([facility])    Trial of victoza      Relevant Medications   liraglutide 18 MG/3ML SOPN   Type II diabetes mellitus, uncontrolled ([place])    Uncontrolled due to dietary nonadherence. Adding Victoza       Relevant Medications   liraglutide 18 MG/3ML SOPN   Other Relevant Orders   POCT HgB A1C (Completed)   Lipid panel (Completed)    Other Visit Diagnoses    Spinal stenosis of lumbar region without neurogenic claudication    -  Primary   Relevant Orders   Ambulatory referral to Sports Medicine      I have discontinued Mr. Kitamura's metFORMIN, meloxicam, Naltrexone-Bupropion HCl ER, predniSONE, cyclobenzaprine, and traMADol. I am also having him start on liraglutide and celecoxib. Additionally, I am having him maintain his FREESTYLE LITE, freestyle, FREESTYLE FREEDOM LITE, glucose blood, losartan-hydrochlorothiazide, Insulin Glargine, B-D ULTRAFINE III SHORT PEN, JANUVIA, and levothyroxine.  Meds ordered this encounter  Medications  . liraglutide 18 MG/3ML SOPN    Sig: 0.6 mg  SubQ  once daily for 1 week;  increase to 1.2 mg once daily;  may increase  to 1.8 mg once daily    Dispense:  9 mL    Refill:  2  . celecoxib (CELEBREX) 200 MG capsule    Sig: Take 1 capsule (200 mg total) by mouth 2 (two) times daily.    Dispense:  60 capsule    Refill:  2    Medications Discontinued During This Encounter  Medication Reason  . cyclobenzaprine (FLEXERIL) 10 MG tablet  Patient has not taken in last 30 days  . Lancets (FREESTYLE) lancets Duplicate  . meloxicam (MOBIC) 15 MG tablet Patient has not taken in last 30 days  . metFORMIN (GLUCOPHAGE) 850 MG tablet Patient has not taken in last 30 days  . Naltrexone-Bupropion HCl ER 8-90 MG TB12 Patient has not taken  in last 30 days  . predniSONE (DELTASONE) 10 MG tablet Therapy completed  . traMADol (ULTRAM) 50 MG tablet Patient has not taken in last 30 days    Follow-up: No Follow-up on file.   Crecencio Mc, MD

## 2016-05-10 NOTE — Patient Instructions (Signed)
Trial of Victoza to improve BS and manage weight (if not $$$)  Gradually in crease dose to 1.8 mg daily    Target pre and post prandial sugars one meal per week   Send me the readings   Try using 2 tramadol every 12 hours and 1 celberex every 12 hours for back pain   Referral to [facility] to [facility]

## 2016-05-11 ENCOUNTER — Encounter: Payer: Self-pay | Admitting: Internal Medicine

## 2016-05-11 LAB — T4 AND TSH
T4, Total: 12 ug/dL (ref 4.5–12.0)
TSH: 0.463 u[IU]/mL (ref 0.450–4.500)

## 2016-05-11 NOTE — Assessment & Plan Note (Signed)
Trial of victoza

## 2016-05-11 NOTE — Assessment & Plan Note (Signed)
nonsurgical by repeat MRI.  Referral to [facility],  advised to increase tramadol to 2 tablets every 12 hours and adding celebrex

## 2016-05-11 NOTE — Assessment & Plan Note (Signed)
Workup for causes has  included ultrasound which suggested fatty liver,  And negative serologies for autoimmune etiologies . Current liver enzymes are treanding down and all modifiable risk factors including obesity, diabetes and hyperlipidemia have been addressed   Lab Results  Component Value Date   ALT 51 05/10/2016   AST 38 (H) 05/10/2016   ALKPHOS 59 05/10/2016   BILITOT 0.7 05/10/2016   Lab Results  Component Value Date   CHOL 118 05/10/2016   HDL 50.40 05/10/2016   LDLCALC 54 05/10/2016   LDLDIRECT 37.0 02/06/2016   TRIG 71.0 05/10/2016   CHOLHDL 2 05/10/2016

## 2016-05-11 NOTE — Assessment & Plan Note (Signed)
Uncontrolled due to dietary nonadherence. Adding Victoza

## 2016-05-11 NOTE — Assessment & Plan Note (Signed)
Thyroid function is WNL on current dose.  No current changes needed.   Lab Results  Component Value Date   TSH 0.463 05/10/2016

## 2016-05-24 ENCOUNTER — Telehealth: Payer: Self-pay | Admitting: *Deleted

## 2016-05-24 NOTE — Telephone Encounter (Signed)
No documentation found for starting PA received additional questions from [place], question answered for form PA reference number [id number], faxed back to [place].

## 2016-05-26 ENCOUNTER — Telehealth: Payer: Self-pay

## 2016-05-26 NOTE — Telephone Encounter (Signed)
Received a fax from pt's insurance stating that his victoza was approved for one year.   Prior authorization reference number : [id number]

## 2016-06-14 ENCOUNTER — Other Ambulatory Visit: Payer: Self-pay | Admitting: Internal Medicine

## 2016-08-31 ENCOUNTER — Encounter: Payer: Self-pay | Admitting: Internal Medicine

## 2016-08-31 ENCOUNTER — Other Ambulatory Visit: Payer: Self-pay | Admitting: Internal Medicine

## 2016-08-31 MED ORDER — ONETOUCH ULTRASOFT LANCETS MISC: 12 refills | Status: DC

## 2016-08-31 MED ORDER — GLUCOSE BLOOD VI STRP: ORAL_STRIP | 12 refills | Status: DC

## 2016-09-07 ENCOUNTER — Encounter: Payer: Self-pay | Admitting: Internal Medicine

## 2016-09-08 NOTE — Telephone Encounter (Signed)
Pt called to follow up on his email. Please advise? Thank you!

## 2016-09-10 ENCOUNTER — Other Ambulatory Visit: Payer: Self-pay | Admitting: Internal Medicine

## 2016-09-10 MED ORDER — INSULIN GLARGINE 100 UNIT/ML SOLOSTAR PEN: 10.0000 [IU] | PEN_INJECTOR | Freq: Every day | SUBCUTANEOUS | 99 refills | Status: DC

## 2016-10-11 ENCOUNTER — Other Ambulatory Visit: Payer: Self-pay | Admitting: Internal Medicine

## 2016-10-20 ENCOUNTER — Other Ambulatory Visit: Payer: Self-pay | Admitting: Internal Medicine

## 2016-10-27 NOTE — Telephone Encounter (Signed)
error 

## 2016-11-08 ENCOUNTER — Encounter: Payer: Self-pay | Admitting: Internal Medicine

## 2016-11-08 ENCOUNTER — Ambulatory Visit (INDEPENDENT_AMBULATORY_CARE_PROVIDER_SITE_OTHER): Payer: 59 | Admitting: Internal Medicine

## 2016-11-08 VITALS — BP 110/74 | HR 87 | Temp 98.2°F | Resp 15 | Ht 74.0 in | Wt 311.0 lb

## 2016-11-08 DIAGNOSIS — E7800 Pure hypercholesterolemia, unspecified: Secondary | ICD-10-CM

## 2016-11-08 DIAGNOSIS — Z23 Encounter for immunization: Secondary | ICD-10-CM

## 2016-11-08 DIAGNOSIS — E032 Hypothyroidism due to medicaments and other exogenous substances: Secondary | ICD-10-CM

## 2016-11-08 DIAGNOSIS — E1165 Type 2 diabetes mellitus with hyperglycemia: Secondary | ICD-10-CM

## 2016-11-08 DIAGNOSIS — K760 Fatty (change of) liver, not elsewhere classified: Secondary | ICD-10-CM | POA: Diagnosis not present

## 2016-11-08 DIAGNOSIS — I10 Essential (primary) hypertension: Secondary | ICD-10-CM

## 2016-11-08 DIAGNOSIS — Z6841 Body Mass Index (BMI) 40.0 and over, adult: Secondary | ICD-10-CM | POA: Diagnosis not present

## 2016-11-08 LAB — POCT GLYCOSYLATED HEMOGLOBIN (HGB A1C): Hemoglobin A1C: 5.7

## 2016-11-08 NOTE — Patient Instructions (Signed)
Great job!  Your diabetes is under excellent control    See you in 6 months

## 2016-11-08 NOTE — Progress Notes (Signed)
Subjective:  Patient ID: Steven Hoover, male    DOB: 04-Jul-1970  Age: 46 y.o. MRN: [id number]  CC: The primary encounter diagnosis was Uncontrolled type 2 diabetes mellitus with hyperglycemia ([place]). Diagnoses of Need for immunization against influenza, Pure hypercholesterolemia, Hypothyroidism, iatrogenic, Fatty liver disease, nonalcoholic, Essential hypertension, benign, and Morbid obesity with BMI of 40.0-44.9, adult ([place]) were also pertinent to this visit.  HPI Steven Hoover presents for 6 month follow up on  Uncontrolled diabetes.    Was asked to return in 3 months,  Not six,  For a1c of 8.4 .  Taking lantus 10 unis  In the evening daily and victoza  and [facility]  Sugars all under 150    Has lost 9 lbs      Patient has no complaints today.  Patient is following a low glycemic index diet and taking all prescribed medications regularly without side effects.  Fasting sugars have been under less than 140 most of the time and post prandials have been under 160 except on rare occasions. Patient is exercising about 3 times per week and intentionally trying to lose weight .  Patient has had an eye exam in the last 12 months and checks feet regularly for signs of infection.  Patient does not walk barefoot outside,  And denies an numbness tingling or burning in feet. Patient is up to date on all recommended vaccinations  Back pain: mild spinal stenosis  By MRI 2018  improved since 2011 MRI per report .   HAS NOT seen [facility] despite referral in April    Needs eye exam   No vision changes.   Lab Results  Component Value Date   HGBA1C 5.7 11/08/2016     Outpatient Medications Prior to Visit  Medication Sig Dispense Refill  . B-D ULTRAFINE III SHORT PEN 31G X 8 MM MISC USE WITH PENS AS DIRECTED 100 each 3  . Blood Glucose Monitoring Suppl (FREESTYLE FREEDOM LITE) W/DEVICE KIT Use to check sugars twice a day 1 each 0  . glucose blood test strip  Use as instructed to check blood sugars twice a day. 100 each 12  . Insulin Glargine (LANTUS SOLOSTAR) 100 UNIT/ML Solostar Pen Inject 10 Units into the skin daily at 10 pm. 5 pen PRN  . JANUVIA 100 MG tablet TAKE 1 TABLET BY MOUTH DAILY. 90 tablet 0  . Lancets (ONETOUCH ULTRASOFT) lancets Use as instructed 100 each 12  . losartan-hydrochlorothiazide (HYZAAR) 50-12.5 MG tablet TAKE 1 TABLET BY MOUTH DAILY. 90 tablet 2  . VICTOZA 18 MG/3ML SOPN INJECT 0.6 MG SUBCUTANEOUSLY ONCE DAILY FOR 1 WEEK; INCREASE TO 1.2 MG ONCE DAILY; MAY INCREASE TO 1.8 MG ONCE DAILY 9 mL 2  . levothyroxine (SYNTHROID, LEVOTHROID) 137 MCG tablet TAKE 2 TABLETS BY MOUTH DAILY BEFORE BREAKFAST 180 tablet 1  . celecoxib (CELEBREX) 200 MG capsule Take 1 capsule (200 mg total) by mouth 2 (two) times daily. (Patient not taking: Reported on 11/08/2016) 60 capsule 2   No facility-administered medications prior to visit.     Review of Systems;  Patient denies headache, fevers, malaise, unintentional weight loss, skin rash, eye pain, sinus congestion and sinus pain, sore throat, dysphagia,  hemoptysis , cough, dyspnea, wheezing, chest pain, palpitations, orthopnea, edema, abdominal pain, nausea, melena, diarrhea, constipation, flank pain, dysuria, hematuria, urinary  Frequency, nocturia, numbness, tingling, seizures,  Focal weakness, Loss of consciousness,  Tremor, insomnia, depression, anxiety, and suicidal  ideation.      Objective:  BP 110/74 (BP Location: Left Arm, Patient Position: Sitting, Cuff Size: Normal)   Pulse 87   Temp 98.2 F (36.8 C) (Oral)   Resp 15   Ht _0  (1.88 m)   Wt (!) 311 lb (141.1 kg)   SpO2 97%   BMI 39.93 kg/m   BP Readings from Last 3 Encounters:  11/08/16 110/74  05/10/16 114/86  02/06/16 112/68    Wt Readings from Last 3 Encounters:  11/08/16 (!) 311 lb (141.1 kg)  05/10/16 (!) 317 lb 9.6 oz (144.1 kg)  02/06/16 (!) 317 lb 2 oz (143.8 kg)    General appearance: alert, cooperative  and appears stated age Ears: normal TM's and external ear canals both ears Throat: lips, mucosa, and tongue normal; teeth and gums normal Neck: no adenopathy, no carotid bruit, supple, symmetrical, trachea midline and thyroid not enlarged, symmetric, no tenderness/mass/nodules Back: symmetric, no curvature. ROM normal. No CVA tenderness. Lungs: clear to auscultation bilaterally Heart: regular rate and rhythm, S1, S2 normal, no murmur, click, rub or gallop Abdomen: soft, non-tender; bowel sounds normal; no masses,  no organomegaly Pulses: 2+ and symmetric Skin: Skin color, texture, turgor normal. No rashes or lesions Lymph nodes: Cervical, supraclavicular, and axillary nodes normal.  Lab Results  Component Value Date   HGBA1C 5.7 11/08/2016   HGBA1C 8.4 05/10/2016   HGBA1C 6.2 02/06/2016    Lab Results  Component Value Date   CREATININE 0.80 11/08/2016   CREATININE 0.84 05/10/2016   CREATININE 0.85 02/06/2016    Lab Results  Component Value Date   WBC 7.3 03/11/2015   HGB 16.5 03/11/2015   HCT 49.0 03/11/2015   PLT 274.0 03/11/2015   GLUCOSE 97 11/08/2016   CHOL 105 11/08/2016   TRIG 76.0 11/08/2016   HDL 46.90 11/08/2016   LDLDIRECT 50.0 11/08/2016   LDLCALC 43 11/08/2016   ALT 29 11/08/2016   AST 20 11/08/2016   NA 135 11/08/2016   K 3.8 11/08/2016   CL 101 11/08/2016   CREATININE 0.80 11/08/2016   BUN 11 11/08/2016   CO2 24 11/08/2016   TSH 0.05 (L) 11/08/2016   HGBA1C 5.7 11/08/2016   MICROALBUR 1.1 10/15/2015    Mr Lumbar Spine Wo Contrast  Result Date: 04/22/2016 CLINICAL DATA:  Low back pain with sciatica. Left leg pain and numbness. EXAM: MRI LUMBAR SPINE WITHOUT CONTRAST TECHNIQUE: Multiplanar, multisequence MR imaging of the lumbar spine was performed. No intravenous contrast was administered. COMPARISON:  MRI 05/21/2009 FINDINGS: Segmentation:  Normal Alignment:  Normal Vertebrae: Negative for fracture or mass. Mild discogenic changes at L4-5. Conus  medullaris: Extends to the L1-2 level and appears normal. Paraspinal and other soft tissues: Negative Disc levels: L1-2:  Negative L2-3:  Negative L3-4: Mild disc and facet degeneration with mild spinal stenosis. There is an element of mild congenital stenosis of the canal. L4-5: Disc degeneration with disc space narrowing. Disc bulging and diffuse endplate spurring similar to the prior study. Improvement in small central disc protrusion since the prior study. Improvement in spinal stenosis. There is mild to moderate spinal stenosis. Mild facet degeneration. Mild subarticular stenosis left greater than right. L5-S1:  Negative IMPRESSION: Mild to moderate spinal stenosis at L4-5 with improvement since 2011. Mild subarticular stenosis at L4-5 left greater than right Mild spinal stenosis L3-4. Electronically Signed   By: Franchot Gallo M.D.   On: 04/22/2016 08:50    Assessment & Plan:   Problem List Items Addressed This  Visit    Essential hypertension, benign    Well controlled on current regimen. Renal function stable, no changes today.      Fatty liver disease, nonalcoholic    Workup for causes has  included ultrasound which suggested fatty liver,  And negative serologies for autoimmune etiologies . Current liver enzymes are  All normal and all modifiable risk factors including obesity, diabetes and hyperlipidemia have been addressed   Lab Results  Component Value Date   ALT 29 11/08/2016   AST 20 11/08/2016   ALKPHOS 49 11/08/2016   BILITOT 0.6 11/08/2016   Lab Results  Component Value Date   CHOL 105 11/08/2016   HDL 46.90 11/08/2016   LDLCALC 43 11/08/2016   LDLDIRECT 50.0 11/08/2016   TRIG 76.0 11/08/2016   CHOLHDL 2 11/08/2016           Hypothyroidism, iatrogenic    Thyroid function is suppressed on current dose of 274 mcg .   Reducing dose to 250 mcg.   Lab Results  Component Value Date   TSH 0.05 (L) 11/08/2016        Relevant Medications   levothyroxine (SYNTHROID,  LEVOTHROID) 125 MCG tablet   Other Relevant Orders   TSH (Completed)   T4, free (Completed)   Morbid obesity with BMI of 40.0-44.9, adult ([place])    I have congratulated him in reduction of   BMI and encouraged  Continued weight loss with goal of 10% of body weigh over the next 6 months using a low glycemic index diet and regular exercise a minimum of 5 days per week.        Type II diabetes mellitus, uncontrolled ([place]) - Primary    Currently well-controlled on current medications .  hemoglobin A1c is at goal of less than 7.0 . Patient is reminded to schedule an annual eye exam and foot exam is normal today. Patient has no microalbuminuria. Patient is tolerating statin therapy for CAD risk reduction and on ACE/ARB for renal protection and hypertension   Lab Results  Component Value Date   HGBA1C 5.7 11/08/2016   Lab Results  Component Value Date   MICROALBUR 1.1 10/15/2015         Relevant Orders   POCT HgB A1C (Completed)   Comprehensive metabolic panel (Completed)   Lipid panel (Completed)    Other Visit Diagnoses    Need for immunization against influenza       Relevant Orders   Flu Vaccine QUAD 36+ mos IM (Completed)   Pure hypercholesterolemia       Relevant Orders   Lipid panel (Completed)   LDL cholesterol, direct (Completed)      I have discontinued Mr. Febles's celecoxib and levothyroxine. I am also having him start on levothyroxine. Additionally, I am having him maintain his FREESTYLE FREEDOM LITE, losartan-hydrochlorothiazide, JANUVIA, glucose blood, onetouch ultrasoft, Insulin Glargine, VICTOZA, and B-D ULTRAFINE III SHORT PEN.  Meds ordered this encounter  Medications  . levothyroxine (SYNTHROID, LEVOTHROID) 125 MCG tablet    Sig: Take 2 tablets (250 mcg total) by mouth daily.    Dispense:  180 tablet    Refill:  3    Medications Discontinued During This Encounter  Medication Reason  . celecoxib (CELEBREX) 200 MG capsule Patient has not taken in last 30  days  . levothyroxine (SYNTHROID, LEVOTHROID) 137 MCG tablet     Follow-up: No Follow-up on file.   Crecencio Mc, MD

## 2016-11-09 ENCOUNTER — Encounter: Payer: Self-pay | Admitting: Internal Medicine

## 2016-11-09 ENCOUNTER — Other Ambulatory Visit: Payer: Self-pay | Admitting: Internal Medicine

## 2016-11-09 LAB — COMPREHENSIVE METABOLIC PANEL
ALT: 29 U/L (ref 0–53)
AST: 20 U/L (ref 0–37)
Albumin: 4.3 g/dL (ref 3.5–5.2)
Alkaline Phosphatase: 49 U/L (ref 39–117)
BUN: 11 mg/dL (ref 6–23)
CO2: 24 mEq/L (ref 19–32)
Calcium: 9.7 mg/dL (ref 8.4–10.5)
Chloride: 101 mEq/L (ref 96–112)
Creatinine, Ser: 0.8 mg/dL (ref 0.40–1.50)
GFR: 110.52 mL/min (ref >60.00)
Glucose, Bld: 97 mg/dL (ref 70–99)
Potassium: 3.8 mEq/L (ref 3.5–5.1)
Sodium: 135 mEq/L (ref 135–145)
Total Bilirubin: 0.6 mg/dL (ref 0.2–1.2)
Total Protein: 7.6 g/dL (ref 6.0–8.3)

## 2016-11-09 LAB — LIPID PANEL
Cholesterol: 105 mg/dL (ref 0–200)
HDL: 46.9 mg/dL (ref >39.00)
LDL Cholesterol: 43 mg/dL (ref 0–99)
NonHDL: 57.9
Total CHOL/HDL Ratio: 2
Triglycerides: 76 mg/dL (ref 0.0–149.0)
VLDL: 15.2 mg/dL (ref 0.0–40.0)

## 2016-11-09 LAB — T4, FREE: Free T4: 1.56 ng/dL (ref 0.60–1.60)

## 2016-11-09 LAB — TSH: TSH: 0.05 u[IU]/mL — ABNORMAL LOW (ref 0.35–4.50)

## 2016-11-09 LAB — LDL CHOLESTEROL, DIRECT: Direct LDL: 50 mg/dL

## 2016-11-09 MED ORDER — LEVOTHYROXINE SODIUM 125 MCG PO TABS: 250.0000 ug | ORAL_TABLET | Freq: Every day | ORAL | 3 refills | Status: DC

## 2016-11-09 NOTE — Assessment & Plan Note (Signed)
Workup for causes has  included ultrasound which suggested fatty liver,  And negative serologies for autoimmune etiologies . Current liver enzymes are  All normal and all modifiable risk factors including obesity, diabetes and hyperlipidemia have been addressed   Lab Results  Component Value Date   ALT 29 11/08/2016   AST 20 11/08/2016   ALKPHOS 49 11/08/2016   BILITOT 0.6 11/08/2016   Lab Results  Component Value Date   CHOL 105 11/08/2016   HDL 46.90 11/08/2016   LDLCALC 43 11/08/2016   LDLDIRECT 50.0 11/08/2016   TRIG 76.0 11/08/2016   CHOLHDL 2 11/08/2016

## 2016-11-09 NOTE — Assessment & Plan Note (Signed)
Well controlled on current regimen. Renal function stable, no changes today. 

## 2016-11-09 NOTE — Assessment & Plan Note (Signed)
Currently well-controlled on current medications .  hemoglobin A1c is at goal of less than 7.0 . Patient is reminded to schedule an annual eye exam and foot exam is normal today. Patient has no microalbuminuria. Patient is tolerating statin therapy for CAD risk reduction and on ACE/ARB for renal protection and hypertension   Lab Results  Component Value Date   HGBA1C 5.7 11/08/2016   Lab Results  Component Value Date   MICROALBUR 1.1 10/15/2015

## 2016-11-09 NOTE — Assessment & Plan Note (Addendum)
Thyroid function is suppressed on current dose of 274 mcg .   Reducing dose to 250 mcg.   Lab Results  Component Value Date   TSH 0.05 (L) 11/08/2016

## 2016-11-09 NOTE — Assessment & Plan Note (Signed)
I have congratulated him in reduction of   BMI and encouraged  Continued weight loss with goal of 10% of body weigh over the next 6 months using a low glycemic index diet and regular exercise a minimum of 5 days per week.   

## 2016-11-11 ENCOUNTER — Encounter: Payer: Self-pay | Admitting: Internal Medicine

## 2016-11-16 ENCOUNTER — Encounter: Payer: Self-pay | Admitting: Internal Medicine

## 2016-11-26 ENCOUNTER — Other Ambulatory Visit: Payer: Self-pay | Admitting: Internal Medicine

## 2016-11-26 MED ORDER — LIRAGLUTIDE 18 MG/3ML ~~LOC~~ SOPN: PEN_INJECTOR | SUBCUTANEOUS | 2 refills | Status: DC

## 2016-12-06 ENCOUNTER — Other Ambulatory Visit: Payer: Self-pay | Admitting: Internal Medicine

## 2017-01-07 ENCOUNTER — Other Ambulatory Visit: Payer: Self-pay

## 2017-01-07 NOTE — Patient Outreach (Signed)
[facility]) Care Management  01/07/2017  Steven Hoover 06/08/1970 [id number]   Case closed in [name].  Member is being followed by the [employer] program Member enrolled in an external program [name] RN, [facility] Care Management Coordinator-Link to [place] [facility] [phone]

## 2017-02-14 DIAGNOSIS — H524 Presbyopia: Secondary | ICD-10-CM | POA: Diagnosis not present

## 2017-02-14 LAB — HM DIABETES EYE EXAM

## 2017-03-03 ENCOUNTER — Encounter: Payer: Self-pay | Admitting: Internal Medicine

## 2017-03-14 ENCOUNTER — Encounter: Payer: Self-pay | Admitting: Internal Medicine

## 2017-03-15 ENCOUNTER — Other Ambulatory Visit: Payer: Self-pay | Admitting: Internal Medicine

## 2017-04-22 ENCOUNTER — Ambulatory Visit (INDEPENDENT_AMBULATORY_CARE_PROVIDER_SITE_OTHER): Payer: 59 | Admitting: Internal Medicine

## 2017-04-22 ENCOUNTER — Encounter: Payer: Self-pay | Admitting: Internal Medicine

## 2017-04-22 VITALS — BP 126/84 | HR 80 | Temp 98.0°F | Resp 15 | Ht 74.0 in | Wt 306.4 lb

## 2017-04-22 DIAGNOSIS — Z0000 Encounter for general adult medical examination without abnormal findings: Secondary | ICD-10-CM

## 2017-04-22 DIAGNOSIS — E032 Hypothyroidism due to medicaments and other exogenous substances: Secondary | ICD-10-CM | POA: Diagnosis not present

## 2017-04-22 DIAGNOSIS — E1165 Type 2 diabetes mellitus with hyperglycemia: Secondary | ICD-10-CM | POA: Diagnosis not present

## 2017-04-22 DIAGNOSIS — I10 Essential (primary) hypertension: Secondary | ICD-10-CM

## 2017-04-22 DIAGNOSIS — Z85850 Personal history of malignant neoplasm of thyroid: Secondary | ICD-10-CM

## 2017-04-22 LAB — COMPREHENSIVE METABOLIC PANEL
ALT: 30 U/L (ref 0–53)
AST: 26 U/L (ref 0–37)
Albumin: 4.4 g/dL (ref 3.5–5.2)
Alkaline Phosphatase: 61 U/L (ref 39–117)
BUN: 11 mg/dL (ref 6–23)
CO2: 25 mEq/L (ref 19–32)
Calcium: 9.6 mg/dL (ref 8.4–10.5)
Chloride: 102 mEq/L (ref 96–112)
Creatinine, Ser: 0.97 mg/dL (ref 0.40–1.50)
GFR: 88.31 mL/min (ref >60.00)
Glucose, Bld: 109 mg/dL — ABNORMAL HIGH (ref 70–99)
Potassium: 4 mEq/L (ref 3.5–5.1)
Sodium: 139 mEq/L (ref 135–145)
Total Bilirubin: 0.6 mg/dL (ref 0.2–1.2)
Total Protein: 7.8 g/dL (ref 6.0–8.3)

## 2017-04-22 LAB — HEMOGLOBIN A1C: Hgb A1c MFr Bld: 6 % (ref 4.6–6.5)

## 2017-04-22 LAB — T4, FREE: Free T4: 1.22 ng/dL (ref 0.60–1.60)

## 2017-04-22 LAB — TSH: TSH: 0.47 u[IU]/mL (ref 0.35–4.50)

## 2017-04-22 MED ORDER — MELOXICAM 15 MG PO TABS: 15.0000 mg | ORAL_TABLET | Freq: Every day | ORAL | 0 refills | Status: DC

## 2017-04-22 MED ORDER — CYCLOBENZAPRINE HCL 10 MG PO TABS: 10.0000 mg | ORAL_TABLET | Freq: Three times a day (TID) | ORAL | 0 refills | Status: DC | PRN

## 2017-04-22 NOTE — Progress Notes (Signed)
Patient ID: Steven Hoover, male    DOB: [birth year]  Age: 47 y.o. MRN: [id number]  The patient is here for annual preventive examination and management of other chronic and acute problems.   The risk factors are reflected in the social history.  The roster of all physicians providing medical care to patient - is listed in the Snapshot section of the chart.  Activities of daily living:  The patient is 100% independent in all ADLs: dressing, toileting, feeding as well as independent mobility  Home safety : The patient has smoke detectors in the home. They wear seatbelts.  There are no firearms at home. There is no violence in the home.   There is no risks for hepatitis, STDs or HIV. There is no   history of blood transfusion. They have no travel history to infectious disease endemic areas of the world.  The patient has seen their dentist in the last six month. They have seen their eye doctor in the last year.     They do not  have excessive sun exposure. Discussed the need for sun protection: hats, long sleeves and use of sunscreen if there is significant sun exposure.   Diet: the importance of a healthy diet is discussed. They do have a healthy diet.  The benefits of regular aerobic exercise were discussed.he is walking 4 times per week ,  30 minutes.   Depression screen: there are no signs or vegative symptoms of depression- irritability, change in appetite, anhedonia, sadness/tearfullness.  The following portions of the patient's history were reviewed and updated as appropriate: allergies, current medications, past family history, past medical history,  past surgical history, past social history  and problem list.  Visual acuity was not assessed per patient preference since she has regular follow up with her ophthalmologist. Hearing and body mass index were assessed and reviewed.   During the course of the visit the patient was educated and counseled about appropriate screening and  preventive services including : nutrition counseling screening, and recommended immunizations.    CC: The primary encounter diagnosis was Uncontrolled type 2 diabetes mellitus with hyperglycemia ([place]). Diagnoses of Hypothyroidism, iatrogenic, History of thyroid cancer, Routine general medical examination at a health care facility, and Essential hypertension, benign were also pertinent to this visit.    Obesity:  He has lost 20 lbs since he started working out.  His first goal is to break 300 lbs.   Diabetes Mellitus:  Using 20 units lantus and 1.8 mg victoza sugars under control   Dermatology   Sees Benitas Graham in [place] for pre cancerous skin lesions removed from posterior  neck and back     History Jamarri has a past medical history of Anxiety, Back pain, Cancer ([place]), Depression, Diabetes mellitus without complication ([place]), Hypertension, OSA on CPAP (2011), Sinusitis, and Thyroid disease.   He has a past surgical history that includes Total thyroidectomy (2004); adnoidectomy; and Colonoscopy with propofol (N/A, 06/28/2014).   His family history includes Diabetes in his father; Parkinson's disease (age of onset: 30) in his father.He reports that he quit smoking about 6 years ago. He has never used smokeless tobacco. He reports that he drinks about 3.0 oz of alcohol per week. He reports that he does not use drugs.  Outpatient Medications Prior to Visit  Medication Sig Dispense Refill  . B-D ULTRAFINE III SHORT PEN 31G X 8 MM MISC USE WITH PENS AS DIRECTED 100 each 3  . Blood Glucose Monitoring Suppl (FREESTYLE FREEDOM LITE)  W/DEVICE KIT Use to check sugars twice a day 1 each 0  . glucose blood test strip Use as instructed to check blood sugars twice a day. 100 each 12  . Lancets (ONETOUCH ULTRASOFT) lancets Use as instructed 100 each 12  . LANTUS SOLOSTAR 100 UNIT/ML Solostar Pen INJECT 20 UNITS INTO THE SKIN DAILY AT 10 PM. 15 mL 5  . levothyroxine (SYNTHROID, LEVOTHROID) 125 MCG  tablet Take 2 tablets (250 mcg total) by mouth daily. 180 tablet 3  . liraglutide (VICTOZA) 18 MG/3ML SOPN INJECT  1.8 Mg Subcutaneously  ONCE DAILY 9 mL 2  . losartan-hydrochlorothiazide (HYZAAR) 50-12.5 MG tablet TAKE 1 TABLET BY MOUTH DAILY. 90 tablet 2  . Insulin Glargine (LANTUS SOLOSTAR) 100 UNIT/ML Solostar Pen Inject 10 Units into the skin daily at 10 pm. (Patient not taking: Reported on 04/22/2017) 5 pen PRN  . JANUVIA 100 MG tablet TAKE 1 TABLET BY MOUTH DAILY. (Patient not taking: Reported on 04/22/2017) 90 tablet 1   No facility-administered medications prior to visit.     Review of Systems   Patient denies headache, fevers, malaise, unintentional weight loss, skin rash, eye pain, sinus congestion and sinus pain, sore throat, dysphagia,  hemoptysis , cough, dyspnea, wheezing, chest pain, palpitations, orthopnea, edema, abdominal pain, nausea, melena, diarrhea, constipation, flank pain, dysuria, hematuria, urinary  Frequency, nocturia, numbness, tingling, seizures,  Focal weakness, Loss of consciousness,  Tremor, insomnia, depression, anxiety, and suicidal ideation.     Objective:  BP 126/84 (BP Location: Left Arm, Patient Position: Sitting, Cuff Size: Large)   Pulse 80   Temp 98 F (36.7 C) (Oral)   Resp 15   Ht _0  (1.88 m)   Wt (!) 306 lb 6.4 oz (139 kg)   SpO2 96%   BMI 39.34 kg/m   Physical Exam   General appearance: alert, cooperative and appears stated age Ears: normal TM's and external ear canals both ears Throat: lips, mucosa, and tongue normal; teeth and gums normal Neck: no adenopathy, no carotid bruit, supple, symmetrical, trachea midline and thyroid not enlarged, symmetric, no tenderness/mass/nodules Back: symmetric, no curvature. ROM normal. No CVA tenderness. Lungs: clear to auscultation bilaterally Heart: regular rate and rhythm, S1, S2 normal, no murmur, click, rub or gallop Abdomen: soft, non-tender; bowel sounds normal; no masses,  no  organomegaly Pulses: 2+ and symmetric Skin: Skin color, texture, turgor normal. No rashes or lesions Lymph nodes: Cervical, supraclavicular, and axillary nodes normal.    Assessment & Plan:   Problem List Items Addressed This Visit    Type II diabetes mellitus, uncontrolled ([place]) - Primary    Improving control with low GI diet, weight loss,  Use of lantus and Victoza . No medication changes today    Lab Results  Component Value Date   HGBA1C 6.0 04/22/2017   Lab Results  Component Value Date   MICROALBUR 1.1 10/15/2015         Relevant Orders   Hemoglobin A1c (Completed)   Comprehensive metabolic panel (Completed)   Routine general medical examination at a health care facility    Annual comprehensive preventive exam was done as well as an evaluation and management of acute and chronic conditions .  During the course of the visit the patient was educated and counseled about appropriate screening and preventive services including :  diabetes screening, lipid analysis with projected  10 year  risk for CAD , nutrition counseling, prostate and colorectal cancer screening, and recommended immunizations.  Printed recommendations for health maintenance  screenings was given.       Hypothyroidism, iatrogenic    Secondary to ablation in 2005 for papillary cell carcinoma.  Thyroid function remains nearly suppressed on current dose of 250 mcg. No changes today   Lab Results  Component Value Date   TSH 0.47 04/22/2017        Relevant Orders   TSH (Completed)   T4, free (Completed)   History of thyroid cancer    Victoza is contraindicated in patients with history of medullary thyroid Carcinoma ,  Not papillary cell CA       Essential hypertension, benign    Well controlled on current regimen. Renal function stable, no changes today. Lab Results  Component Value Date   CREATININE 0.97 04/22/2017   Lab Results  Component Value Date   NA 139 04/22/2017   K 4.0 04/22/2017   CL  102 04/22/2017   CO2 25 04/22/2017            I have discontinued Lendon Ka. Corney's Insulin Glargine and JANUVIA. I am also having him start on cyclobenzaprine and meloxicam. Additionally, I am having him maintain his FREESTYLE FREEDOM LITE, losartan-hydrochlorothiazide, glucose blood, onetouch ultrasoft, B-D ULTRAFINE III SHORT PEN, LANTUS SOLOSTAR, levothyroxine, and liraglutide.  Meds ordered this encounter  Medications  . cyclobenzaprine (FLEXERIL) 10 MG tablet    Sig: Take 1 tablet (10 mg total) by mouth 3 (three) times daily as needed for muscle spasms.    Dispense:  30 tablet    Refill:  0  . meloxicam (MOBIC) 15 MG tablet    Sig: Take 1 tablet (15 mg total) by mouth daily. As needed for back pain    Dispense:  30 tablet    Refill:  0    Medications Discontinued During This Encounter  Medication Reason  . Insulin Glargine (LANTUS SOLOSTAR) 100 UNIT/ML Solostar Pen Change in therapy  . JANUVIA 100 MG tablet Error    Follow-up: Return in about 6 months (around 10/23/2017) for follow up diabetes.   Crecencio Mc, MD

## 2017-04-22 NOTE — Patient Instructions (Signed)

## 2017-04-24 NOTE — Assessment & Plan Note (Signed)

## 2017-04-24 NOTE — Assessment & Plan Note (Signed)
Improving control with low GI diet, weight loss,  Use of lantus and Victoza . No medication changes today    Lab Results  Component Value Date   HGBA1C 6.0 04/22/2017   Lab Results  Component Value Date   MICROALBUR 1.1 10/15/2015

## 2017-04-24 NOTE — Assessment & Plan Note (Signed)
Victoza is contraindicated in patients with history of medullary thyroid Carcinoma ,  Not papillary cell CA

## 2017-04-24 NOTE — Assessment & Plan Note (Signed)
Well controlled on current regimen. Renal function stable, no changes today. Lab Results  Component Value Date   CREATININE 0.97 04/22/2017   Lab Results  Component Value Date   NA 139 04/22/2017   K 4.0 04/22/2017   CL 102 04/22/2017   CO2 25 04/22/2017

## 2017-04-24 NOTE — Assessment & Plan Note (Signed)
Secondary to ablation in 2005 for papillary cell carcinoma.  Thyroid function remains nearly suppressed on current dose of 250 mcg. No changes today   Lab Results  Component Value Date   TSH 0.47 04/22/2017

## 2017-04-27 ENCOUNTER — Other Ambulatory Visit: Payer: Self-pay | Admitting: Internal Medicine

## 2017-06-03 ENCOUNTER — Other Ambulatory Visit: Payer: Self-pay | Admitting: Internal Medicine

## 2017-06-29 ENCOUNTER — Other Ambulatory Visit: Payer: Self-pay | Admitting: Internal Medicine

## 2017-06-29 ENCOUNTER — Telehealth: Payer: 59 | Admitting: Nurse Practitioner

## 2017-06-29 DIAGNOSIS — K591 Functional diarrhea: Secondary | ICD-10-CM | POA: Diagnosis not present

## 2017-06-29 NOTE — Progress Notes (Signed)
We are sorry that you are not feeling well.  Here is how we plan to help!  Based on what you have shared with me it looks like you have Acute Infectious Diarrhea.  Most cases of acute diarrhea are due to infections with virus and bacteria and are self-limited conditions lasting less than 14 days.  For your symptoms you may take Imodium 2 mg tablets that are over the counter at your local pharmacy. Take two tablet now and then one after each loose stool up to 6 a day.  Antibiotics are not needed for most people with diarrhea.   HOME CARE  We recommend changing your diet to help with your symptoms for the next few days.  Drink plenty of fluids that contain water salt and sugar. Sports drinks such as Gatorade may help.   You may try broths, soups, bananas, applesauce, soft breads, mashed potatoes or crackers.   You are considered infectious for as long as the diarrhea continues. Hand washing or use of alcohol based hand sanitizers is recommend.  It is best to stay out of work or school until your symptoms stop.   GET HELP RIGHT AWAY  If you have dark yellow colored urine or do not pass urine frequently you should drink more fluids.    If your symptoms worsen   If you feel like you are going to pass out (faint)  You have a new problem  MAKE SURE YOU   Understand these instructions.  Will watch your condition.  Will get help right away if you are not doing well or get worse.  Your e-visit answers were reviewed by a board certified advanced clinical practitioner to complete your personal care plan.  Depending on the condition, your plan could have included both over the counter or prescription medications.  If there is a problem please reply  once you have received a response from your provider.  Your safety is important to us.  If you have drug allergies check your prescription carefully.    You can use MyChart to ask questions about today's visit, request a non-urgent call  back, or ask for a work or school excuse for 24 hours related to this e-Visit. If it has been greater than 24 hours you will need to follow up with your provider, or enter a new e-Visit to address those concerns.   You will get an e-mail in the next two days asking about your experience.  I hope that your e-visit has been valuable and will speed your recovery. Thank you for using e-visits.  

## 2017-07-29 ENCOUNTER — Other Ambulatory Visit: Payer: Self-pay | Admitting: Internal Medicine

## 2017-09-08 ENCOUNTER — Other Ambulatory Visit: Payer: Self-pay | Admitting: Internal Medicine

## 2017-11-02 ENCOUNTER — Other Ambulatory Visit: Payer: Self-pay | Admitting: Internal Medicine

## 2017-11-17 ENCOUNTER — Other Ambulatory Visit: Payer: Self-pay | Admitting: Internal Medicine

## 2017-12-08 ENCOUNTER — Other Ambulatory Visit: Payer: Self-pay | Admitting: Internal Medicine

## 2017-12-08 NOTE — Telephone Encounter (Signed)
Last OV 04/22/2017   Last refilled  11/09/2016 disp 15 ML with 5 refills   Sent to PCP for approval

## 2017-12-13 ENCOUNTER — Ambulatory Visit (INDEPENDENT_AMBULATORY_CARE_PROVIDER_SITE_OTHER): Payer: 59 | Admitting: Internal Medicine

## 2017-12-13 ENCOUNTER — Encounter: Payer: Self-pay | Admitting: Internal Medicine

## 2017-12-13 VITALS — BP 122/86 | HR 80 | Temp 98.3°F | Resp 14 | Ht 74.0 in | Wt 305.8 lb

## 2017-12-13 DIAGNOSIS — Z6841 Body Mass Index (BMI) 40.0 and over, adult: Secondary | ICD-10-CM

## 2017-12-13 DIAGNOSIS — E1165 Type 2 diabetes mellitus with hyperglycemia: Secondary | ICD-10-CM | POA: Diagnosis not present

## 2017-12-13 DIAGNOSIS — E032 Hypothyroidism due to medicaments and other exogenous substances: Secondary | ICD-10-CM

## 2017-12-13 DIAGNOSIS — Z0000 Encounter for general adult medical examination without abnormal findings: Secondary | ICD-10-CM | POA: Diagnosis not present

## 2017-12-13 DIAGNOSIS — E119 Type 2 diabetes mellitus without complications: Secondary | ICD-10-CM

## 2017-12-13 DIAGNOSIS — K760 Fatty (change of) liver, not elsewhere classified: Secondary | ICD-10-CM

## 2017-12-13 DIAGNOSIS — G4733 Obstructive sleep apnea (adult) (pediatric): Secondary | ICD-10-CM

## 2017-12-13 LAB — POCT GLYCOSYLATED HEMOGLOBIN (HGB A1C): Hemoglobin A1C: 5.3 % (ref 4.0–5.6)

## 2017-12-13 NOTE — Progress Notes (Signed)
Patient ID: Steven Hoover, male    DOB: Dec 08, 1970  Age: 47 y.o. MRN: [id number]  The patient is here for annual preventive  examination and management of other chronic and acute problems.   The risk factors are reflected in the social history.  The roster of all physicians providing medical care to patient - is listed in the Snapshot section of the chart.  Activities of daily living:  The patient is 100% independent in all ADLs: dressing, toileting, feeding as well as independent mobility  Home safety : The patient has smoke detectors in the home. They wear seatbelts.  There are no firearms at home. There is no violence in the home.   There is no risks for hepatitis, STDs or HIV. There is no   history of blood transfusion. They have no travel history to infectious disease endemic areas of the world.  The patient has seen their dentist in the last six month. They have seen their eye doctor in the last year. They admit to slight hearing difficulty with regard to whispered voices and some television programs.  They have deferred audiologic testing in the last year.  They do not  have excessive sun exposure. Discussed the need for sun protection: hats, long sleeves and use of sunscreen if there is significant sun exposure.   Diet: the importance of a healthy diet is discussed. They do have a healthy diet.  The benefits of regular aerobic exercise were discussed. Weight sonce a week,  Cardio once a week,  Walking the other days  Depression screen: there are no signs or vegative symptoms of depression- irritability, change in appetite, anhedonia, sadness/tearfullness.  The following portions of the patient's history were reviewed and updated as appropriate: allergies, current medications, past family history, past medical history,  past surgical history, past social history  and problem list.  Visual acuity was not assessed per patient preference since she has regular follow up with her  ophthalmologist. Hearing and body mass index were assessed and reviewed.   During the course of the visit the patient was educated and counseled about appropriate screening and preventive services including : fall prevention , diabetes screening, nutrition counseling, colorectal cancer screening, and recommended immunizations.    CC: The primary encounter diagnosis was Uncontrolled type 2 diabetes mellitus with hyperglycemia ([name]). Diagnoses of Fatty liver disease, nonalcoholic, Hypothyroidism, iatrogenic, Morbid obesity with BMI of 40.0-44.9, adult [facility]), Routine general medical examination at a health care facility, Diabetes mellitus without complication ([name]), and OSA (obstructive sleep apnea) were also pertinent to this visit.  6 month follow up on diabetes.  Patient has no complaints today.  Patient is following a low glycemic index diet and taking all prescribed medications regularly without side effects.  Fasting sugars have been under less than 140 most of the time and post prandials have been under 160 except on rare occasions. Patient is exercising about 3 times per week and intentionally trying to lose weight .  Patient has had an eye exam in the last 12 months and checks feet regularly for signs of infection.  Patient does not walk barefoot outside,  And denies an numbness tingling or burning in feet. Patient is up to date on all recommended vaccinations . History Steven Hoover has a past medical history of Anxiety, Back pain, Cancer ([place]), Depression, Diabetes mellitus without complication ([place]), Hypertension, OSA on CPAP (2011), Sinusitis, and Thyroid disease.   He has a past surgical history that includes Total thyroidectomy (2004); adnoidectomy; and Colonoscopy  with propofol (N/A, 06/28/2014).   His family history includes Diabetes in his father; Parkinson's disease (age of onset: 64) in his father.He reports that he quit smoking about 7 years ago. He has never used smokeless tobacco. He  reports that he drinks about 5.0 standard drinks of alcohol per week. He reports that he does not use drugs.  Outpatient Medications Prior to Visit  Medication Sig Dispense Refill  . ACCU-CHEK FASTCLIX LANCETS MISC USE AS INSTRUCTED 102 each 5  . ACCU-CHEK GUIDE test strip USE AS INSTRUCTED TO CHECK BLOOD SUGARS TWICE A DAY. 100 each 5  . B-D ULTRAFINE III SHORT PEN 31G X 8 MM MISC USE WITH PENS AS DIRECTED 100 each 3  . LANTUS SOLOSTAR 100 UNIT/ML Solostar Pen INJECT 20 UNITS INTO THE SKIN DAILY AT 10 PM. 15 mL 0  . levothyroxine (SYNTHROID, LEVOTHROID) 125 MCG tablet TAKE 2 TABLETS (250 MCG TOTAL) BY MOUTH DAILY. 180 tablet 0  . liraglutide (VICTOZA) 18 MG/3ML SOPN INJECT 1.8 MG SUBCUTANEOUSLY ONCE DAILY 9 mL 2  . losartan-hydrochlorothiazide (HYZAAR) 50-12.5 MG tablet TAKE 1 TABLET BY MOUTH DAILY. 90 tablet 1  . meloxicam (MOBIC) 15 MG tablet Take 1 tablet (15 mg total) by mouth daily. As needed for back pain 30 tablet 0  . Blood Glucose Monitoring Suppl (FREESTYLE FREEDOM LITE) W/DEVICE KIT Use to check sugars twice a day 1 each 0  . cyclobenzaprine (FLEXERIL) 10 MG tablet Take 1 tablet (10 mg total) by mouth 3 (three) times daily as needed for muscle spasms. 30 tablet 0   No facility-administered medications prior to visit.     Review of Systems   Patient denies headache, fevers, malaise, unintentional weight loss, skin rash, eye pain, sinus congestion and sinus pain, sore throat, dysphagia,  hemoptysis , cough, dyspnea, wheezing, chest pain, palpitations, orthopnea, edema, abdominal pain, nausea, melena, diarrhea, constipation, flank pain, dysuria, hematuria, urinary  Frequency, nocturia, numbness, tingling, seizures,  Focal weakness, Loss of consciousness,  Tremor, insomnia, depression, anxiety, and suicidal ideation.     Objective:  BP 122/86 (BP Location: Left Arm, Patient Position: Sitting, Cuff Size: Large)   Pulse 80   Temp 98.3 F (36.8 C) (Oral)   Resp 14   Ht 6' 2"  (1.88  m)   Wt (!) 305 lb 12.8 oz (138.7 kg)   SpO2 94%   BMI 39.26 kg/m   Physical Exam   General appearance: alert, cooperative and appears stated age Ears: normal TM's and external ear canals both ears Throat: lips, mucosa, and tongue normal; teeth and gums normal Neck: no adenopathy, no carotid bruit, supple, symmetrical, trachea midline and thyroid not enlarged, symmetric, no tenderness/mass/nodules Back: symmetric, no curvature. ROM normal. No CVA tenderness. Lungs: clear to auscultation bilaterally Heart: regular rate and rhythm, S1, S2 normal, no murmur, click, rub or gallop Abdomen: soft, non-tender; bowel sounds normal; no masses,  no organomegaly Pulses: 2+ and symmetric Skin: Skin color, texture, turgor normal. No rashes or lesions Lymph nodes: Cervical, supraclavicular, and axillary nodes normal.    Assessment & Plan:   Problem List Items Addressed This Visit    Diabetes mellitus without complication ([place]) - Primary    Currently  extremelywell-controlled on current medications .  hemoglobin A1c is less than 6.0 . Patient is reminded to schedule an annual eye exam and foot exam is normal today. Patient has no microalbuminuria. Patient is tolerating statin therapy for CAD risk reduction and on ACE/ARB for renal protection and hypertension   Lab Results  Component Value Date   HGBA1C 5.3 12/13/2017         Fatty liver disease, nonalcoholic    Workup for causes has  included ultrasound which suggested fatty liver,  And negative serologies for autoimmune etiologies . Current liver enzymes are  All normal and all modifiable risk factors including obesity, diabetes and hyperlipidemia have been addressed   Lab Results  Component Value Date   ALT 26 12/13/2017   AST 22 12/13/2017   ALKPHOS 56 12/13/2017   BILITOT 0.6 12/13/2017   Lab Results  Component Value Date   CHOL 106 12/13/2017   HDL 50.90 12/13/2017   LDLCALC 34 12/13/2017   LDLDIRECT 50.0 11/08/2016   TRIG  104.0 12/13/2017   CHOLHDL 2 12/13/2017           Relevant Orders   Comprehensive metabolic panel (Completed)   Hypothyroidism, iatrogenic    Secondary to ablation in 2005 for papillary cell carcinoma.  Thyroid function remains nearly suppressed on current dose of 250 mcg. No changes today   Lab Results  Component Value Date   TSH 0.85 12/13/2017        Relevant Orders   TSH (Completed)   Morbid obesity with BMI of 40.0-44.9, adult ([place])    I have congratulated him in reduction of   BMI and encouraged  Continued weight loss with goal of 10% of body weigh over the next 6 months using a low glycemic index diet and regular exercise a minimum of 5 days per week.        OSA (obstructive sleep apnea)    He remains intolerant of CPAP       Routine general medical examination at a health care facility    Annual comprehensive preventive exam was done as well as an evaluation and management of acute and chronic conditions .  During the course of the visit the patient was educated and counseled about appropriate screening and preventive services including :  diabetes screening, lipid analysis with projected  10 year  risk for CAD , nutrition counseling, prostate and colorectal cancer screening, and recommended immunizations.  Printed recommendations for health maintenance screenings was given.          I have discontinued Lendon Ka. Warf "Billy"'s FREESTYLE FREEDOM LITE and cyclobenzaprine. I am also having him maintain his meloxicam, B-D ULTRAFINE III SHORT PEN, losartan-hydrochlorothiazide, ACCU-CHEK FASTCLIX LANCETS, ACCU-CHEK GUIDE, liraglutide, levothyroxine, and LANTUS SOLOSTAR.  No orders of the defined types were placed in this encounter.   Medications Discontinued During This Encounter  Medication Reason  . Blood Glucose Monitoring Suppl (FREESTYLE FREEDOM LITE) W/DEVICE KIT Error  . cyclobenzaprine (FLEXERIL) 10 MG tablet Error    Follow-up: Return in about 6 months  (around 06/13/2018) for follow up diabetes.   Crecencio Mc, MD

## 2017-12-13 NOTE — Patient Instructions (Signed)
CONGRATULATIONS!!!!!!! Your perseverance has paid off !   Health Maintenance, Male A healthy lifestyle and preventive care is important for your health and wellness. Ask your health care provider about what schedule of regular examinations is right for you. What should I know about weight and diet? Eat a Healthy Diet  Eat plenty of vegetables, fruits, whole grains, low-fat dairy products, and lean protein.  Do not eat a lot of foods high in solid fats, added sugars, or salt.  Maintain a Healthy Weight Regular exercise can help you achieve or maintain a healthy weight. You should:  Do at least 150 minutes of exercise each week. The exercise should increase your heart rate and make you sweat (moderate-intensity exercise).  Do strength-training exercises at least twice a week.  Watch Your Levels of Cholesterol and Blood Lipids  Have your blood tested for lipids and cholesterol every 5 years starting at 47 years of age. If you are at high risk for heart disease, you should start having your blood tested when you are 47 years old. You may need to have your cholesterol levels checked more often if: ? Your lipid or cholesterol levels are high. ? You are older than 47 years of age. ? You are at high risk for heart disease.  What should I know about cancer screening? Many types of cancers can be detected early and may often be prevented. Lung Cancer  You should be screened every year for lung cancer if: ? You are a current smoker who has smoked for at least 30 years. ? You are a former smoker who has quit within the past 15 years.  Talk to your health care provider about your screening options, when you should start screening, and how often you should be screened.  Colorectal Cancer  Routine colorectal cancer screening usually begins at 47 years of age and should be repeated every 5-10 years until you are 47 years old. You may need to be screened more often if early forms of precancerous  polyps or small growths are found. Your health care provider may recommend screening at an earlier age if you have risk factors for colon cancer.  Your health care provider may recommend using home test kits to check for hidden blood in the stool.  A small camera at the end of a tube can be used to examine your colon (sigmoidoscopy or colonoscopy). This checks for the earliest forms of colorectal cancer.  Prostate and Testicular Cancer  Depending on your age and overall health, your health care provider may do certain tests to screen for prostate and testicular cancer.  Talk to your health care provider about any symptoms or concerns you have about testicular or prostate cancer.  Skin Cancer  Check your skin from head to toe regularly.  Tell your health care provider about any new moles or changes in moles, especially if: ? There is a change in a mole's size, shape, or color. ? You have a mole that is larger than a pencil eraser.  Always use sunscreen. Apply sunscreen liberally and repeat throughout the day.  Protect yourself by wearing long sleeves, pants, a wide-brimmed hat, and sunglasses when outside.  What should I know about heart disease, diabetes, and high blood pressure?  If you are 88-40 years of age, have your blood pressure checked every 3-5 years. If you are 17 years of age or older, have your blood pressure checked every year. You should have your blood pressure measured twice-once when you  are at a hospital or clinic, and once when you are not at a hospital or clinic. Record the average of the two measurements. To check your blood pressure when you are not at a hospital or clinic, you can use: ? An automated blood pressure machine at a pharmacy. ? A home blood pressure monitor.  Talk to your health care provider about your target blood pressure.  If you are between 67-4 years old, ask your health care provider if you should take aspirin to prevent heart  disease.  Have regular diabetes screenings by checking your fasting blood sugar level. ? If you are at a normal weight and have a low risk for diabetes, have this test once every three years after the age of 32. ? If you are overweight and have a high risk for diabetes, consider being tested at a younger age or more often.  A one-time screening for abdominal aortic aneurysm (AAA) by ultrasound is recommended for men aged 38-75 years who are current or former smokers. What should I know about preventing infection? Hepatitis B If you have a higher risk for hepatitis B, you should be screened for this virus. Talk with your health care provider to find out if you are at risk for hepatitis B infection. Hepatitis C Blood testing is recommended for:  Everyone born from [birth year] through 1965.  Anyone with known risk factors for hepatitis C.  Sexually Transmitted Diseases (STDs)  You should be screened each year for STDs including gonorrhea and chlamydia if: ? You are sexually active and are younger than 47 years of age. ? You are older than 47 years of age and your health care provider tells you that you are at risk for this type of infection. ? Your sexual activity has changed since you were last screened and you are at an increased risk for chlamydia or gonorrhea. Ask your health care provider if you are at risk.  Talk with your health care provider about whether you are at high risk of being infected with HIV. Your health care provider may recommend a prescription medicine to help prevent HIV infection.  What else can I do?  Schedule regular health, dental, and eye exams.  Stay current with your vaccines (immunizations).  Do not use any tobacco products, such as cigarettes, chewing tobacco, and e-cigarettes. If you need help quitting, ask your health care provider.  Limit alcohol intake to no more than 2 drinks per day. One drink equals 12 ounces of beer, 5 ounces of wine, or 1 ounces of  hard liquor.  Do not use street drugs.  Do not share needles.  Ask your health care provider for help if you need support or information about quitting drugs.  Tell your health care provider if you often feel depressed.  Tell your health care provider if you have ever been abused or do not feel safe at home. This information is not intended to replace advice given to you by your health care provider. Make sure you discuss any questions you have with your health care provider. Document Released: 07/17/2007 Document Revised: 09/17/2015 Document Reviewed: 10/22/2014 Elsevier Interactive Patient Education  [employer].

## 2017-12-14 ENCOUNTER — Encounter: Payer: Self-pay | Admitting: Internal Medicine

## 2017-12-14 LAB — LIPID PANEL
Cholesterol: 106 mg/dL (ref 0–200)
HDL: 50.9 mg/dL (ref >39.00)
LDL Cholesterol: 34 mg/dL (ref 0–99)
NonHDL: 54.86
Total CHOL/HDL Ratio: 2
Triglycerides: 104 mg/dL (ref 0.0–149.0)
VLDL: 20.8 mg/dL (ref 0.0–40.0)

## 2017-12-14 LAB — COMPREHENSIVE METABOLIC PANEL
ALT: 26 U/L (ref 0–53)
AST: 22 U/L (ref 0–37)
Albumin: 4.8 g/dL (ref 3.5–5.2)
Alkaline Phosphatase: 56 U/L (ref 39–117)
BUN: 11 mg/dL (ref 6–23)
CO2: 30 mEq/L (ref 19–32)
Calcium: 10.1 mg/dL (ref 8.4–10.5)
Chloride: 100 mEq/L (ref 96–112)
Creatinine, Ser: 1.05 mg/dL (ref 0.40–1.50)
GFR: 80.37 mL/min (ref >60.00)
Glucose, Bld: 95 mg/dL (ref 70–99)
Potassium: 4 mEq/L (ref 3.5–5.1)
Sodium: 139 mEq/L (ref 135–145)
Total Bilirubin: 0.6 mg/dL (ref 0.2–1.2)
Total Protein: 7.8 g/dL (ref 6.0–8.3)

## 2017-12-14 LAB — MICROALBUMIN / CREATININE URINE RATIO
Creatinine,U: 28.9 mg/dL
Microalb Creat Ratio: 2.4 mg/g (ref 0.0–30.0)
Microalb, Ur: <0.7 mg/dL (ref 0.0–1.9)

## 2017-12-14 LAB — TSH: TSH: 0.85 u[IU]/mL (ref 0.35–4.50)

## 2017-12-14 NOTE — Assessment & Plan Note (Signed)

## 2017-12-14 NOTE — Assessment & Plan Note (Signed)
I have congratulated him in reduction of   BMI and encouraged  Continued weight loss with goal of 10% of body weigh over the next 6 months using a low glycemic index diet and regular exercise a minimum of 5 days per week.   

## 2017-12-14 NOTE — Assessment & Plan Note (Signed)
Secondary to ablation in 2005 for papillary cell carcinoma.  Thyroid function remains nearly suppressed on current dose of 250 mcg. No changes today   Lab Results  Component Value Date   TSH 0.85 12/13/2017

## 2017-12-14 NOTE — Assessment & Plan Note (Signed)
Workup for causes has  included ultrasound which suggested fatty liver,  And negative serologies for autoimmune etiologies . Current liver enzymes are  All normal and all modifiable risk factors including obesity, diabetes and hyperlipidemia have been addressed   Lab Results  Component Value Date   ALT 26 12/13/2017   AST 22 12/13/2017   ALKPHOS 56 12/13/2017   BILITOT 0.6 12/13/2017   Lab Results  Component Value Date   CHOL 106 12/13/2017   HDL 50.90 12/13/2017   LDLCALC 34 12/13/2017   LDLDIRECT 50.0 11/08/2016   TRIG 104.0 12/13/2017   CHOLHDL 2 12/13/2017

## 2017-12-14 NOTE — Assessment & Plan Note (Signed)
He remains intolerant of CPAP

## 2017-12-14 NOTE — Assessment & Plan Note (Signed)
Currently  extremelywell-controlled on current medications .  hemoglobin A1c is less than 6.0 . Patient is reminded to schedule an annual eye exam and foot exam is normal today. Patient has no microalbuminuria. Patient is tolerating statin therapy for CAD risk reduction and on ACE/ARB for renal protection and hypertension   Lab Results  Component Value Date   HGBA1C 5.3 12/13/2017    

## 2017-12-23 ENCOUNTER — Other Ambulatory Visit: Payer: Self-pay | Admitting: Internal Medicine

## 2017-12-27 ENCOUNTER — Other Ambulatory Visit: Payer: Self-pay | Admitting: Internal Medicine

## 2018-01-29 ENCOUNTER — Other Ambulatory Visit: Payer: Self-pay

## 2018-01-30 MED ORDER — MELOXICAM 15 MG PO TABS: 15.0000 mg | ORAL_TABLET | Freq: Every day | ORAL | 2 refills | Status: DC

## 2018-02-15 ENCOUNTER — Other Ambulatory Visit: Payer: Self-pay | Admitting: Internal Medicine

## 2018-03-06 ENCOUNTER — Other Ambulatory Visit: Payer: Self-pay | Admitting: Internal Medicine

## 2018-05-17 NOTE — Telephone Encounter (Signed)
Spoke with pt and he has been scheduled for a virtual appt tomorrow at 12pm. Pt is aware of appt date and time.

## 2018-05-18 ENCOUNTER — Other Ambulatory Visit: Payer: Self-pay

## 2018-05-18 ENCOUNTER — Ambulatory Visit (INDEPENDENT_AMBULATORY_CARE_PROVIDER_SITE_OTHER): Payer: 59 | Admitting: Internal Medicine

## 2018-05-18 VITALS — Wt 292.0 lb

## 2018-05-18 DIAGNOSIS — E119 Type 2 diabetes mellitus without complications: Secondary | ICD-10-CM | POA: Diagnosis not present

## 2018-05-18 DIAGNOSIS — K760 Fatty (change of) liver, not elsewhere classified: Secondary | ICD-10-CM

## 2018-05-18 DIAGNOSIS — G4733 Obstructive sleep apnea (adult) (pediatric): Secondary | ICD-10-CM

## 2018-05-18 DIAGNOSIS — E032 Hypothyroidism due to medicaments and other exogenous substances: Secondary | ICD-10-CM | POA: Diagnosis not present

## 2018-05-18 DIAGNOSIS — F419 Anxiety disorder, unspecified: Secondary | ICD-10-CM

## 2018-05-18 MED ORDER — BUSPIRONE HCL 7.5 MG PO TABS: 7.5000 mg | ORAL_TABLET | Freq: Three times a day (TID) | ORAL | 1 refills | Status: DC

## 2018-05-18 MED ORDER — ALPRAZOLAM 0.5 MG PO TABS: 0.5000 mg | ORAL_TABLET | Freq: Every evening | ORAL | 5 refills | Status: DC | PRN

## 2018-05-18 NOTE — Patient Instructions (Addendum)
I'm suggesting you resume buspirone for daytime anxiety.   Buspirone  is NOT a benzo; you were on it for years (2015 to 2017)  .  Start with one tablet in the am and one in the late afternoon.  You can add a 3rd late evening, if needed.  You can still use the alprazolam for insomnia or early morning wakiing .  The dose of buspirone can be doubled if needed  After a week.  The office will call you to set up a non fasting lab appointment next week   The meds went to [facility] per policy

## 2018-05-18 NOTE — Progress Notes (Signed)
Virtual Visit via [email or web address]  This visit type was conducted due to national recommendations for restrictions regarding the COVID-19 pandemic (e.g. social distancing).  This format is felt to be most appropriate for this patient at this time.  All issues noted in this document were discussed and addressed.  No physical exam was performed (except for noted visual exam findings with Video Visits).   I connected with@ on 05/18/18 at 12:00 PM EDT by a video enabled telemedicine application or telephone and verified that I am speaking with the correct person using two identifiers. Location patient: home Location provider: work or home office Persons participating in the virtual visit: patient, provider  I discussed the limitations, risks, security and privacy concerns of performing an evaluation and management service by telephone and the availability of in person appointments. I also discussed with the patient that there may be a patient responsible charge related to this service. The patient expressed understanding and agreed to proceed.  Reason for visit: follow up on type 2 DM and GAD   HPI:  Type 2 dm;  Last eval Nov 2019; his was a1c was  5.3.   Patient has no complaints today.  Patient is following a low glycemic index diet and taking all prescribed medications regularly without side effects.  Fasting sugars have been under 130 and post prandials have been under 160 except on rare occasions. Patient is exercising about 5 times per week and intentionally trying to lose weight .  Patient has had an eye exam in the last 12 months and checks feet regularly for signs of infection.  Patient does not walk barefoot outside,  And denies an numbness tingling or burning in feet. 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 nd Used  xanax  For  insomnia/early wakening and buspirone during the day to avoid losig his temper  fro 2015 to 2017   Walking 2 miles daily with wife and lifting weights at home.  Back hurting less with strengthening or core. Weight has reached a nadir of 292     ROS: See pertinent positives and negatives per HPI.  Past Medical History:  Diagnosis Date  . Anxiety   . Back pain   . Cancer ([place])    thyroid  . Depression   . Diabetes mellitus without complication ([place])   . Hypertension   . OSA on CPAP 2011   on auto VPAP, sleeping well humidied 02  . Sinusitis   . Thyroid disease     Past Surgical History:  Procedure Laterality Date  . adnoidectomy    . COLONOSCOPY WITH PROPOFOL N/A 06/28/2014   Procedure: COLONOSCOPY WITH PROPOFOL;  Surgeon: Josefine Class, MD;  Location: [facility] ENDOSCOPY;  Service: Endoscopy;  Laterality: N/A;  . TOTAL THYROIDECTOMY  2004    Family History  Problem Relation Age of Onset  . Diabetes Father   . Parkinson's disease Father 58  . COPD Neg Hx     SOCIAL HX: married,  Employed,  One son   Current Outpatient Medications:  .  ACCU-CHEK FASTCLIX LANCETS MISC, USE AS INSTRUCTED, Disp: 102 each, Rfl: 5 .  ACCU-CHEK GUIDE test strip, USE AS INSTRUCTED TO CHECK BLOOD SUGARS TWICE A DAY., Disp: 100 each, Rfl: 5 .  ALPRAZolam (XANAX) 0.5 MG tablet, Take 1 tablet (0.5 mg total) by mouth  at bedtime as needed for anxiety., Disp: 30 tablet, Rfl: 5 .  busPIRone (BUSPAR) 7.5 MG tablet, Take 1 tablet (7.5 mg total) by mouth 3 (three) times daily., Disp: 90 tablet, Rfl: 1 .  LANTUS SOLOSTAR 100 UNIT/ML Solostar Pen, INJECT 20 UNITS INTO THE SKIN DAILY AT 10 PM., Disp: 15 mL, Rfl: 2 .  levothyroxine (SYNTHROID, LEVOTHROID) 125 MCG tablet, TAKE 2 TABLETS (250 MCG TOTAL) BY MOUTH DAILY., Disp: 180 tablet, Rfl: 0 .  liraglutide (VICTOZA) 18 MG/3ML SOPN, INJECT 1.8 MG SUBCUTANEOUSLY ONCE DAILY, Disp: 9 mL, Rfl: 2 .  losartan-hydrochlorothiazide (HYZAAR) 50-12.5 MG tablet, TAKE 1  TABLET BY MOUTH DAILY., Disp: 90 tablet, Rfl: 1 .  meloxicam (MOBIC) 15 MG tablet, Take 1 tablet (15 mg total) by mouth daily. As needed for back pain, Disp: 30 tablet, Rfl: 2 .  UNIFINE PENTIPS 31G X 8 MM MISC, USE WITH PENS AS DIRECTED, Disp: 100 each, Rfl: 3  EXAM:  VITALS per patient if applicable:  GENERAL: alert, oriented, appears well and in no acute distress  HEENT: atraumatic, conjunttiva clear, no obvious abnormalities on inspection of external nose and ears  NECK: normal movements of the head and neck  LUNGS: on inspection no signs of respiratory distress, breathing rate appears normal, no obvious gross SOB, gasping or wheezing  CV: no obvious cyanosis  MS: moves all visible extremities without noticeable abnormality  PSYCH/NEURO: pleasant and cooperative, no obvious depression ,  speech and thought processing grossly intact  ASSESSMENT AND PLAN:  Discussed the following assessment and plan:  Fatty liver disease, nonalcoholic - Plan: Comprehensive metabolic panel  Anxiety - Plan: DISCONTINUED: ALPRAZolam (XANAX) 0.5 MG tablet, DISCONTINUED: ALPRAZolam (XANAX) 0.5 MG tablet  Diabetes mellitus without complication (HCC) - Plan: Hemoglobin A1c  Hypothyroidism, iatrogenic - Plan: TSH, T4, free  OSA (obstructive sleep apnea)  Anxiety Aggravated by current COVID 19 epidemic creating a national shortage protective gear, which he is tasked with finding for the  [employer].  Resuming buspirone bid/tid and alprazolam prn   Diabetes mellitus without complication ([place]) Currently  extremelywell-controlled on current medications .  hemoglobin A1c is less than 6.0 . Patient is reminded to schedule an annual eye exam and foot exam is normal today. Patient has no microalbuminuria. Patient is tolerating statin therapy for CAD risk reduction and on ACE/ARB for renal protection and hypertension   Lab Results  Component Value Date   HGBA1C 5.3 12/13/2017     OSA (obstructive sleep  apnea) Untreated due to intolerance of CPAP     I discussed the assessment and treatment plan with the patient. The patient was provided an opportunity to ask questions and all were answered. The patient agreed with the plan and demonstrated an understanding of the instructions.   The patient was advised to call back or seek an in-person evaluation if the symptoms worsen or if the condition fails to improve as anticipated.  I provided 25 minutes of non-face-to-face time during this encounter.   Crecencio Mc, MD

## 2018-05-19 DIAGNOSIS — F419 Anxiety disorder, unspecified: Secondary | ICD-10-CM

## 2018-05-19 MED ORDER — ALPRAZOLAM 0.5 MG PO TABS: 0.5000 mg | ORAL_TABLET | Freq: Every evening | ORAL | 5 refills | Status: DC | PRN

## 2018-05-20 NOTE — Assessment & Plan Note (Signed)
Currently  extremelywell-controlled on current medications .  hemoglobin A1c is less than 6.0 . Patient is reminded to schedule an annual eye exam and foot exam is normal today. Patient has no microalbuminuria. Patient is tolerating statin therapy for CAD risk reduction and on ACE/ARB for renal protection and hypertension   Lab Results  Component Value Date   HGBA1C 5.3 12/13/2017

## 2018-05-20 NOTE — Assessment & Plan Note (Signed)
Untreated due to intolerance of CPAP

## 2018-05-20 NOTE — Assessment & Plan Note (Signed)
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  Resuming buspirone bid/tid and alprazolam prn

## 2018-05-23 ENCOUNTER — Other Ambulatory Visit: Payer: Self-pay | Admitting: Internal Medicine

## 2018-05-23 ENCOUNTER — Other Ambulatory Visit (INDEPENDENT_AMBULATORY_CARE_PROVIDER_SITE_OTHER): Payer: 59

## 2018-05-23 ENCOUNTER — Other Ambulatory Visit: Payer: Self-pay

## 2018-05-23 DIAGNOSIS — K760 Fatty (change of) liver, not elsewhere classified: Secondary | ICD-10-CM

## 2018-05-23 DIAGNOSIS — E119 Type 2 diabetes mellitus without complications: Secondary | ICD-10-CM | POA: Diagnosis not present

## 2018-05-23 DIAGNOSIS — E032 Hypothyroidism due to medicaments and other exogenous substances: Secondary | ICD-10-CM

## 2018-05-23 LAB — COMPREHENSIVE METABOLIC PANEL
ALT: 35 U/L (ref 0–53)
AST: 26 U/L (ref 0–37)
Albumin: 4.4 g/dL (ref 3.5–5.2)
Alkaline Phosphatase: 56 U/L (ref 39–117)
BUN: 13 mg/dL (ref 6–23)
CO2: 28 mEq/L (ref 19–32)
Calcium: 9.2 mg/dL (ref 8.4–10.5)
Chloride: 100 mEq/L (ref 96–112)
Creatinine, Ser: 0.89 mg/dL (ref 0.40–1.50)
GFR: 91.34 mL/min (ref >60.00)
Glucose, Bld: 114 mg/dL — ABNORMAL HIGH (ref 70–99)
Potassium: 4.1 mEq/L (ref 3.5–5.1)
Sodium: 136 mEq/L (ref 135–145)
Total Bilirubin: 0.7 mg/dL (ref 0.2–1.2)
Total Protein: 7 g/dL (ref 6.0–8.3)

## 2018-05-23 LAB — TSH: TSH: 0.31 u[IU]/mL — ABNORMAL LOW (ref 0.35–4.50)

## 2018-05-23 LAB — T4, FREE: Free T4: 1.54 ng/dL (ref 0.60–1.60)

## 2018-05-23 LAB — HEMOGLOBIN A1C: Hgb A1c MFr Bld: 5.9 % (ref 4.6–6.5)

## 2018-05-23 MED ORDER — LIRAGLUTIDE 18 MG/3ML ~~LOC~~ SOPN: PEN_INJECTOR | SUBCUTANEOUS | 2 refills | Status: DC

## 2018-05-23 NOTE — Addendum Note (Signed)
Addended by: Crecencio Mc on: 05/23/2018 09:26 AM   Modules accepted: Orders

## 2018-06-14 ENCOUNTER — Ambulatory Visit: Payer: Self-pay | Admitting: Internal Medicine

## 2018-06-20 ENCOUNTER — Other Ambulatory Visit: Payer: Self-pay

## 2018-06-20 ENCOUNTER — Encounter: Payer: Self-pay | Admitting: Internal Medicine

## 2018-06-20 ENCOUNTER — Ambulatory Visit (INDEPENDENT_AMBULATORY_CARE_PROVIDER_SITE_OTHER): Payer: 59 | Admitting: Internal Medicine

## 2018-06-20 DIAGNOSIS — I10 Essential (primary) hypertension: Secondary | ICD-10-CM

## 2018-06-20 DIAGNOSIS — E032 Hypothyroidism due to medicaments and other exogenous substances: Secondary | ICD-10-CM

## 2018-06-20 DIAGNOSIS — E119 Type 2 diabetes mellitus without complications: Secondary | ICD-10-CM | POA: Diagnosis not present

## 2018-06-20 NOTE — Progress Notes (Signed)
Virtual Visit via [email or web address]  This visit type was conducted due to national recommendations for restrictions regarding the COVID-19 pandemic (e.g. social distancing).  This format is felt to be most appropriate for this patient at this time.  All issues noted in this document were discussed and addressed.  No physical exam was performed (except for noted visual exam findings with Video Visits).   I connected with@ on 06/20/18 at  4:00 PM EDT by a video enabled telemedicine application or telephone and verified that I am speaking with the correct person using two identifiers. Location patient: home Location provider: work Persons participating in the virtual visit: patient, provider  I discussed the limitations, risks, security and privacy concerns of performing an evaluation and management service by telephone and the availability of in person appointments. I also discussed with the patient that there may be a patient responsible charge related to this service. The patient expressed understanding and agreed to proceed.   Reason for visit: follow up on treatment of GAD with buspirone and alprazolam ; 2) DM , hypertension and hypothyroidism   HPI:  48 yr old male with T2DM, iatrogenic hypothyroidism, and hypertension  With recent recurrence of anxiety triggered by work stressors.    1) GAD:  He has resumed buspirone 7.5 mg bid/tid and reports that he is sleeping better and handling conflict more effectively without anger and irritability.  He is using alprazolam once daily at 8 pm and sleeping through the night.    2) T2DM :  Eating better,  Exercising more regularly.  BS staying < 120 fasting and < 150 post prandially  3) Hypertension: patient checks blood pressure twice weekly at home.  Readings have been for the most part < 140/80 at rest . Patient is following a reduced salt diet most days and is taking medications as prescribed   ROS: See pertinent positives and negatives per HPI.  Past  Medical History:  Diagnosis Date  . Anxiety   . Back pain   . Cancer ([place])    thyroid  . Depression   . Diabetes mellitus without complication ([place])   . Hypertension   . OSA on CPAP 2011   on auto VPAP, sleeping well humidied 02  . Sinusitis   . Thyroid disease     Past Surgical History:  Procedure Laterality Date  . adnoidectomy    . COLONOSCOPY WITH PROPOFOL N/A 06/28/2014   Procedure: COLONOSCOPY WITH PROPOFOL;  Surgeon: Josefine Class, MD;  Location: [facility] ENDOSCOPY;  Service: Endoscopy;  Laterality: N/A;  . TOTAL THYROIDECTOMY  2004    Family History  Problem Relation Age of Onset  . Diabetes Father   . Parkinson's disease Father 7  . COPD Neg Hx     SOCIAL HX: unchanged from one month ago    Current Outpatient Medications:  .  ACCU-CHEK FASTCLIX LANCETS MISC, USE AS INSTRUCTED, Disp: 102 each, Rfl: 5 .  ACCU-CHEK GUIDE test strip, USE AS INSTRUCTED TO CHECK BLOOD SUGARS TWICE A DAY., Disp: 100 each, Rfl: 5 .  ALPRAZolam (XANAX) 0.5 MG tablet, Take 1 tablet (0.5 mg total) by mouth at bedtime as needed for anxiety., Disp: 30 tablet, Rfl: 5 .  busPIRone (BUSPAR) 7.5 MG tablet, Take 1 tablet (7.5 mg total) by mouth 3 (three) times daily., Disp: 90 tablet, Rfl: 1 .  hydrochlorothiazide (MICROZIDE) 12.5 MG capsule, Take 12.5 mg by mouth daily. , Disp: , Rfl:  .  LANTUS SOLOSTAR 100 UNIT/ML Solostar Pen, INJECT 20 UNITS  INTO THE SKIN DAILY AT 10 PM., Disp: 15 mL, Rfl: 2 .  levothyroxine (SYNTHROID) 125 MCG tablet, TAKE 2 TABLETS (250 MCG TOTAL) BY MOUTH DAILY., Disp: 180 tablet, Rfl: 0 .  liraglutide (VICTOZA) 18 MG/3ML SOPN, INJECT 1.8 MG SUBCUTANEOUSLY ONCE DAILY, Disp: 9 mL, Rfl: 2 .  losartan (COZAAR) 50 MG tablet, Take 50 mg by mouth daily. , Disp: , Rfl:  .  UNIFINE PENTIPS 31G X 8 MM MISC, USE WITH PENS AS DIRECTED, Disp: 100 each, Rfl: 3  EXAM:  VITALS per patient if applicable:  GENERAL: alert, oriented, appears well and in no acute distress  HEENT:  atraumatic, conjunttiva clear, no obvious abnormalities on inspection of external nose and ears  NECK: normal movements of the head and neck  LUNGS: on inspection no signs of respiratory distress, breathing rate appears normal, no obvious gross SOB, gasping or wheezing  CV: no obvious cyanosis  MS: moves all visible extremities without noticeable abnormality  PSYCH/NEURO: pleasant and cooperative, no obvious depression or anxiety, speech and thought processing grossly intact  ASSESSMENT AND PLAN:  Discussed the following assessment and plan:  Hypothyroidism, iatrogenic - Plan: TSH, T4, free  Essential hypertension, benign  Diabetes mellitus without complication (HCC)  Essential hypertension, benign Well controlled on current regimen. Renal function stable, no changes today.  Diabetes mellitus without complication ([place]) Currently  well-controlled on current medications .  hemoglobin A1c is less than 6.0 . Patient is reminded to schedule an annual eye exam and foot exam is normal today. Patient has no microalbuminuria. Patient is tolerating statin therapy for CAD risk reduction and on ACE/ARB for renal protection and hypertension   Lab Results  Component Value Date   HGBA1C 5.9 05/23/2018     Hypothyroidism, iatrogenic Secondary to ablation in 2005 for papillary cell carcinoma.  Thyroid function remains nearly suppressed on current dose of 250 mcg. No changes today .  Repeat in one month  Lab Results  Component Value Date   TSH 0.31 (L) 05/23/2018     I discussed the assessment and treatment plan with the patient. The patient was provided an opportunity to ask questions and all were answered. The patient agreed with the plan and demonstrated an understanding of the instructions.   The patient was advised to call back or seek an in-person evaluation if the symptoms worsen or if the condition fails to improve as anticipated.  I provided 15 minutes of non-face-to-face time  during this encounter.   Crecencio Mc, MD

## 2018-06-21 NOTE — Assessment & Plan Note (Signed)
Currently  well-controlled on current medications .  hemoglobin A1c is less than 6.0 . Patient is reminded to schedule an annual eye exam and foot exam is normal today. Patient has no microalbuminuria. Patient is tolerating statin therapy for CAD risk reduction and on ACE/ARB for renal protection and hypertension   Lab Results  Component Value Date   HGBA1C 5.9 05/23/2018

## 2018-06-21 NOTE — Assessment & Plan Note (Signed)
Secondary to ablation in 2005 for papillary cell carcinoma.  Thyroid function remains nearly suppressed on current dose of 250 mcg. No changes today .  Repeat in one month  Lab Results  Component Value Date   TSH 0.31 (L) 05/23/2018

## 2018-06-21 NOTE — Assessment & Plan Note (Signed)
Well controlled on current regimen. Renal function stable, no changes today. 

## 2018-06-29 ENCOUNTER — Other Ambulatory Visit: Payer: Self-pay | Admitting: Internal Medicine

## 2018-06-29 NOTE — Telephone Encounter (Signed)
Looks like these were refilled one week ago by a historical provider.

## 2018-07-20 ENCOUNTER — Telehealth: Payer: 59 | Admitting: Family

## 2018-07-20 DIAGNOSIS — H00012 Hordeolum externum right lower eyelid: Secondary | ICD-10-CM

## 2018-07-20 NOTE — Progress Notes (Signed)
Greater than 5 minutes, yet less than 10 minutes of time have been spent researching, coordinating, and implementing care for this patient today.  Thank you for the details you included in the comment boxes. Those details are very helpful in determining the best course of treatment for you and help [place] to provide the best care.  We are sorry that you are not feeling well. Here is how we plan to help!  Based on what you have shared with me it looks like you have a stye.  A stye is an inflammation of the eyelid.  It is often a red, painful lump near the edge of the eyelid that may look like a boil or a pimple.  A stye develops when an infection occurs at the base of an eyelash.   We have made appropriate suggestions for you based upon your presentation: Simple styes can be treated without medical intervention.  Most styes either resolve spontaneously or resolve with simple home treatment by applying warm compresses or heated washcloth to the stye for about 10-15 minutes three to four times a day. This causes the stye to drain and resolve.  HOME CARE:   Wash your hands often!  Let the stye open on its own. Don't squeeze or open it.  Don't rub your eyes. This can irritate your eyes and let in bacteria.  If you need to touch your eyes, wash your hands first.  Don't wear eye makeup or contact lenses until the area has healed.  GET HELP RIGHT AWAY IF:   Your symptoms do not improve.  You develop blurred or loss of vision.  Your symptoms worsen (increased discharge, pain or redness).  Thank you for choosing an e-visit.  Your e-visit answers were reviewed by a board certified advanced clinical practitioner to complete your personal care plan.  Depending upon the condition, your plan could have included both over the counter or prescription medications.  Please review your pharmacy choice.  Make sure the pharmacy is open so you can pick up prescription now.  If there is a problem, you may  contact your provider through [employer] and have the prescription routed to another pharmacy.    Your safety is important to [place].  If you have drug allergies check your prescription carefully.  For the next 24 hours you can use MyChart to ask questions about today's visit, request a non-urgent call back, or ask for a work or school excuse.  You will get an email in the next two days asking about your experience.  I hope you that your e-visit has been valuable and will speed your recovery.

## 2018-08-07 ENCOUNTER — Other Ambulatory Visit: Payer: Self-pay | Admitting: Internal Medicine

## 2018-08-22 ENCOUNTER — Other Ambulatory Visit: Payer: Self-pay | Admitting: Internal Medicine

## 2018-10-19 ENCOUNTER — Other Ambulatory Visit: Payer: Self-pay | Admitting: Internal Medicine

## 2018-11-24 ENCOUNTER — Other Ambulatory Visit: Payer: Self-pay | Admitting: Internal Medicine

## 2018-12-13 ENCOUNTER — Telehealth: Payer: Self-pay | Admitting: *Deleted

## 2018-12-13 NOTE — Telephone Encounter (Signed)
Copied from [place] [id number]. Topic: Appointment Scheduling - Scheduling Inquiry for Clinic >> Dec 13, 2018  9:34 AM Yvette Rack wrote: Reason for CRM: Pt returned call to office for screening. Attempted several times to transfer pt to the office but there was no answer. Pt requests call back.

## 2018-12-14 ENCOUNTER — Other Ambulatory Visit: Payer: Self-pay

## 2018-12-15 ENCOUNTER — Other Ambulatory Visit: Payer: Self-pay

## 2018-12-15 ENCOUNTER — Ambulatory Visit (INDEPENDENT_AMBULATORY_CARE_PROVIDER_SITE_OTHER): Payer: 59 | Admitting: Internal Medicine

## 2018-12-15 ENCOUNTER — Encounter: Payer: Self-pay | Admitting: Internal Medicine

## 2018-12-15 VITALS — BP 120/80 | HR 80 | Temp 98.5°F | Ht 74.0 in | Wt 304.2 lb

## 2018-12-15 DIAGNOSIS — E032 Hypothyroidism due to medicaments and other exogenous substances: Secondary | ICD-10-CM

## 2018-12-15 DIAGNOSIS — Z6841 Body Mass Index (BMI) 40.0 and over, adult: Secondary | ICD-10-CM

## 2018-12-15 DIAGNOSIS — F419 Anxiety disorder, unspecified: Secondary | ICD-10-CM

## 2018-12-15 DIAGNOSIS — E119 Type 2 diabetes mellitus without complications: Secondary | ICD-10-CM | POA: Diagnosis not present

## 2018-12-15 DIAGNOSIS — K760 Fatty (change of) liver, not elsewhere classified: Secondary | ICD-10-CM | POA: Diagnosis not present

## 2018-12-15 DIAGNOSIS — I10 Essential (primary) hypertension: Secondary | ICD-10-CM | POA: Diagnosis not present

## 2018-12-15 DIAGNOSIS — Z0000 Encounter for general adult medical examination without abnormal findings: Secondary | ICD-10-CM

## 2018-12-15 MED ORDER — ALPRAZOLAM 0.5 MG PO TABS: 0.5000 mg | ORAL_TABLET | Freq: Every evening | ORAL | 5 refills | Status: DC | PRN

## 2018-12-15 MED FILL — ALPRAZolam 0.5 MG TABS: 0.5 | 30 days supply | Qty: 30 | Fill #0

## 2018-12-15 NOTE — Progress Notes (Signed)
Patient ID: Steven Hoover, male    DOB: 11-12-1970  Age: 48 y.o. MRN: [id number]  The patient is here for annual preventive examination and management of other chronic and acute problems.   The risk factors are reflected in the social history.  The roster of all physicians providing medical care to patient - is listed in the Snapshot section of the chart.  Activities of daily living:  The patient is 100% independent in all ADLs: dressing, toileting, feeding as well as independent mobility  Home safety : The patient has smoke detectors in the home. They wear seatbelts.  There are no firearms at home. There is no violence in the home.   There is no risks for hepatitis, STDs or HIV. There is no   history of blood transfusion. They have no travel history to infectious disease endemic areas of the world.  The patient has seen their dentist in the last six month. They have seen their eye doctor in the last year. They admit to slight hearing difficulty with regard to whispered voices and some television programs.  They have deferred audiologic testing in the last year.  They do not  have excessive sun exposure. Discussed the need for sun protection: hats, long sleeves and use of sunscreen if there is significant sun exposure.   Diet: the importance of a healthy diet is discussed. They do have a healthy diet.  The benefits of regular aerobic exercise were discussed. He is exercising at least 3 times per week .   Depression screen: there are no signs or vegative symptoms of depression- irritability, change in appetite, anhedonia, sadness/tearfullness.  The following portions of the patient's history were reviewed and updated as appropriate: allergies, current medications, past family history, past medical history,  past surgical history, past social history  and problem list.  Visual acuity was not assessed per patient preference since she has regular follow up with her ophthalmologist. Hearing and  body mass index were assessed and reviewed.   During the course of the visit the patient was educated and counseled about appropriate screening and preventive services including : fall prevention , diabetes screening, nutrition counseling, colorectal cancer screening, and recommended immunizations.    CC: The primary encounter diagnosis was Routine general medical examination at a health care facility. Diagnoses of Anxiety, Essential hypertension, benign, Hypothyroidism, iatrogenic, Diabetes mellitus without complication ([place]), Fatty liver disease, nonalcoholic, and Morbid obesity with BMI of 40.0-44.9, adult [facility]) were also pertinent to this visit.  1) obesity:  Discussed diet and exercise . Weight has plateaued despite taking victoza.  Snacking too much,  not exercising as often since the gyms have closed due to COVID 19 .  2)  DM:  Fasting have been higher lately,  Up to  130 .  No lows.  Using lantus 20 unit daily and victoza.     Lab Results  Component Value Date   HGBA1C 5.8 (H) 12/15/2018    History Steven Hoover has a past medical history of Anxiety, Back pain, Cancer ([place]), Depression, Diabetes mellitus without complication ([place]), Hypertension, OSA on CPAP (2011), Sinusitis, and Thyroid disease.   He has a past surgical history that includes Total thyroidectomy (2004); adnoidectomy; and Colonoscopy with propofol (N/A, 06/28/2014).   His family history includes Diabetes in his father; Parkinson's disease (age of onset: 22) in his father.He reports that he quit smoking about 8 years ago. He has never used smokeless tobacco. He reports current alcohol use of about 5.0 standard drinks of  alcohol per week. He reports that he does not use drugs.  Outpatient Medications Prior to Visit  Medication Sig Dispense Refill  . Accu-Chek FastClix Lancets MISC USE AS INSTRUCTED 102 each 5  . ACCU-CHEK GUIDE test strip USE AS INSTRUCTED TO CHECK BLOOD SUGARS TWICE A DAY. 100 each 5  . busPIRone (BUSPAR)  7.5 MG tablet Take 1 tablet (7.5 mg total) by mouth 3 (three) times daily. 90 tablet 1  . hydrochlorothiazide (MICROZIDE) 12.5 MG capsule TAKE 1 CAPSULE BY MOUTH ONCE DAILY 90 capsule 1  . LANTUS SOLOSTAR 100 UNIT/ML Solostar Pen INJECT 20 UNITS INTO THE SKIN DAILY AT 10 PM. 15 mL 2  . levothyroxine (SYNTHROID) 125 MCG tablet TAKE 2 TABLETS BY MOUTH DAILY. 180 tablet 0  . losartan (COZAAR) 50 MG tablet TAKE 1 TABLET BY MOUTH DAILY. 90 tablet 1  . UNIFINE PENTIPS 31G X 8 MM MISC USE WITH PENS AS DIRECTED 100 each 3  . VICTOZA 18 MG/3ML SOPN INJECT 1.8 MG SUBCUTANEOUSLY ONCE DAILY 9 mL 2  . ALPRAZolam (XANAX) 0.5 MG tablet Take 1 tablet (0.5 mg total) by mouth at bedtime as needed for anxiety. 30 tablet 5   No facility-administered medications prior to visit.     Review of Systems   Patient denies headache, fevers, malaise, unintentional weight loss, skin rash, eye pain, sinus congestion and sinus pain, sore throat, dysphagia,  hemoptysis , cough, dyspnea, wheezing, chest pain, palpitations, orthopnea, edema, abdominal pain, nausea, melena, diarrhea, constipation, flank pain, dysuria, hematuria, urinary  Frequency, nocturia, numbness, tingling, seizures,  Focal weakness, Loss of consciousness,  Tremor, insomnia, depression, anxiety, and suicidal ideation.      Objective:  BP 120/80   Pulse 80   Temp 98.5 F (36.9 C) (Temporal)   Ht '6\' 2"'$  (1.88 m)   Wt (!) 304 lb 3.2 oz (138 kg)   SpO2 99%   BMI 39.06 kg/m   Physical Exam  General appearance: alert, cooperative and appears stated age Ears: normal TM's and external ear canals both ears Throat: lips, mucosa, and tongue normal; teeth and gums normal Neck: no adenopathy, no carotid bruit, supple, symmetrical, trachea midline and thyroid not enlarged, symmetric, no tenderness/mass/nodules Back: symmetric, no curvature. ROM normal. No CVA tenderness. Lungs: clear to auscultation bilaterally Heart: regular rate and rhythm, S1, S2 normal,  no murmur, click, rub or gallop Abdomen: soft, non-tender; bowel sounds normal; no masses,  no organomegaly Pulses: 2+ and symmetric Skin: Skin color, texture, turgor normal. No rashes or lesions Lymph nodes: Cervical, supraclavicular, and axillary nodes normal.  Assessment & Plan:   Problem List Items Addressed This Visit      Unprioritized   Morbid obesity with BMI of 40.0-44.9, adult ([place])    I have encouraged  Continued weight loss with goal of 10% of body weigh over the next 6 months using a low glycemic index diet and regular exercise a minimum of 5 days per week.  Will recommend change in therapy from Victoza to [place] by current COVID 19 epidemic and the increased responsibilities at work and at home,  Continue buspirone bid/tid and alprazolam prn       Relevant Medications   ALPRAZolam (XANAX) 0.5 MG tablet   Fatty liver disease, nonalcoholic    Workup for causes has  included ultrasound which suggested fatty liver,  And negative serologies for autoimmune etiologies . Current liver enzymes are  All normal and all modifiable  risk factors including obesity, diabetes and hyperlipidemia have been addressed   Lab Results  Component Value Date   ALT 37 12/15/2018   AST 29 12/15/2018   ALKPHOS 56 05/23/2018   BILITOT 1.0 12/15/2018   Lab Results  Component Value Date   CHOL 109 12/15/2018   HDL 54 12/15/2018   LDLCALC 41 12/15/2018   LDLDIRECT 50.0 11/08/2016   TRIG 65 12/15/2018   CHOLHDL 2.0 12/15/2018           Diabetes mellitus without complication ([place])    Currently  well-controlled on current medications .  hemoglobin A1c is less than 6.0 . Patient is reminded to schedule an annual eye exam and foot exam is normal today. Patient has no microalbuminuria. Patient is tolerating statin therapy for CAD risk reduction and on ACE/ARB for renal protection and hypertension   Lab Results  Component Value Date   HGBA1C 5.8 (H) 12/15/2018          Relevant Orders   HgB A1c (Completed)   Lipid Profile (Completed)   Urine Microalbumin w/creat. ratio (Completed)   Hypothyroidism, iatrogenic    Secondary to ablation in 2005 for papillary cell carcinoma.  Thyroid function remains nearly suppressed on current dose of 250 mcg. No changes today .   Lab Results  Component Value Date   TSH 0.67 12/15/2018        Relevant Orders   T4, free (Completed)   TSH (Completed)   Routine general medical examination at a health care facility - Primary    age appropriate education and counseling updated, referrals for preventative services and immunizations addressed, dietary and smoking counseling addressed, most recent labs reviewed.  I have personally reviewed and have noted:  1) the patient's medical and social history 2) The pt's use of alcohol, tobacco, and illicit drugs 3) The patient's current medications and supplements 4) Functional ability including ADL's, fall risk, home safety risk, hearing and visual impairment 5) Diet and physical activities 6) Evidence for depression or mood disorder 7) The patient's height, weight, and BMI have been recorded in the chart  I have made referrals, and provided counseling and education based on review of the above      Essential hypertension, benign    Well controlled on current regimen. Renal function stable, no changes today.      Relevant Orders   Comp Met (CMET) (Completed)      I am having Lendon Ka. Marrufo "Billy" maintain his Unifine Pentips, busPIRone, hydrochlorothiazide, losartan, Accu-Chek Guide, Lantus SoloStar, Accu-Chek FastClix Lancets, levothyroxine, Victoza, and ALPRAZolam.  Meds ordered this encounter  Medications  . ALPRAZolam (XANAX) 0.5 MG tablet    Sig: Take 1 tablet (0.5 mg total) by mouth at bedtime as needed for anxiety.    Dispense:  30 tablet    Refill:  5    Medications Discontinued During This Encounter  Medication Reason  . ALPRAZolam  (XANAX) 0.5 MG tablet Reorder    Follow-up: No follow-ups on file.   Crecencio Mc, MD

## 2018-12-15 NOTE — Patient Instructions (Signed)

## 2018-12-16 LAB — COMPREHENSIVE METABOLIC PANEL
AG Ratio: 1.6 (calc) (ref 1.0–2.5)
ALT: 37 U/L (ref 9–46)
AST: 29 U/L (ref 10–40)
Albumin: 4.4 g/dL (ref 3.6–5.1)
Alkaline phosphatase (APISO): 50 U/L (ref 36–130)
BUN: 11 mg/dL (ref 7–25)
CO2: 27 mmol/L (ref 20–32)
Calcium: 9.4 mg/dL (ref 8.6–10.3)
Chloride: 96 mmol/L — ABNORMAL LOW (ref 98–110)
Creat: 0.94 mg/dL (ref 0.60–1.35)
Globulin: 2.7 g/dL (calc) (ref 1.9–3.7)
Glucose, Bld: 88 mg/dL (ref 65–99)
Potassium: 3.8 mmol/L (ref 3.5–5.3)
Sodium: 134 mmol/L — ABNORMAL LOW (ref 135–146)
Total Bilirubin: 1 mg/dL (ref 0.2–1.2)
Total Protein: 7.1 g/dL (ref 6.1–8.1)

## 2018-12-16 LAB — T4, FREE: Free T4: 1.8 ng/dL (ref 0.8–1.8)

## 2018-12-16 LAB — LIPID PANEL
Cholesterol: 109 mg/dL (ref <200)
HDL: 54 mg/dL (ref >=40)
LDL Cholesterol (Calc): 41 mg/dL (calc)
Non-HDL Cholesterol (Calc): 55 mg/dL (calc) (ref <130)
Total CHOL/HDL Ratio: 2 (calc) (ref <5.0)
Triglycerides: 65 mg/dL (ref <150)

## 2018-12-16 LAB — HEMOGLOBIN A1C
Hgb A1c MFr Bld: 5.8 % of total Hgb — ABNORMAL HIGH (ref <5.7)
Mean Plasma Glucose: 120 (calc)
eAG (mmol/L): 6.6 (calc)

## 2018-12-16 LAB — TSH: TSH: 0.67 mIU/L (ref 0.40–4.50)

## 2018-12-16 LAB — MICROALBUMIN / CREATININE URINE RATIO
Creatinine, Urine: 70 mg/dL (ref 20–320)
Microalb Creat Ratio: 4 mcg/mg creat (ref <30)
Microalb, Ur: 0.3 mg/dL

## 2018-12-17 NOTE — Assessment & Plan Note (Signed)
Workup for causes has  included ultrasound which suggested fatty liver,  And negative serologies for autoimmune etiologies . Current liver enzymes are  All normal and all modifiable risk factors including obesity, diabetes and hyperlipidemia have been addressed   Lab Results  Component Value Date   ALT 37 12/15/2018   AST 29 12/15/2018   ALKPHOS 56 05/23/2018   BILITOT 1.0 12/15/2018   Lab Results  Component Value Date   CHOL 109 12/15/2018   HDL 54 12/15/2018   LDLCALC 41 12/15/2018   LDLDIRECT 50.0 11/08/2016   TRIG 65 12/15/2018   CHOLHDL 2.0 12/15/2018

## 2018-12-17 NOTE — Assessment & Plan Note (Signed)
Well controlled on current regimen. Renal function stable, no changes today. 

## 2018-12-17 NOTE — Assessment & Plan Note (Signed)
Aggravated by current COVID 19 epidemic and the increased responsibilities at work and at home,  Continue buspirone bid/tid and alprazolam prn

## 2018-12-17 NOTE — Assessment & Plan Note (Signed)
Secondary to ablation in 2005 for papillary cell carcinoma.  Thyroid function remains nearly suppressed on current dose of 250 mcg. No changes today .   Lab Results  Component Value Date   TSH 0.67 12/15/2018

## 2018-12-17 NOTE — Assessment & Plan Note (Signed)
Currently  well-controlled on current medications .  hemoglobin A1c is less than 6.0 . Patient is reminded to schedule an annual eye exam and foot exam is normal today. Patient has no microalbuminuria. Patient is tolerating statin therapy for CAD risk reduction and on ACE/ARB for renal protection and hypertension   Lab Results  Component Value Date   HGBA1C 5.8 (H) 12/15/2018

## 2018-12-17 NOTE — Assessment & Plan Note (Signed)
I have encouraged  Continued weight loss with goal of 10% of body weigh over the next 6 months using a low glycemic index diet and regular exercise a minimum of 5 days per week.  Will recommend change in therapy from Victoza to Trulicity

## 2018-12-17 NOTE — Assessment & Plan Note (Signed)

## 2018-12-21 MED ORDER — TRULICITY 0.75 MG/0.5ML ~~LOC~~ SOAJ: SUBCUTANEOUS | 2 refills | Status: DC

## 2018-12-25 ENCOUNTER — Other Ambulatory Visit: Payer: Self-pay | Admitting: Internal Medicine

## 2019-01-02 ENCOUNTER — Other Ambulatory Visit: Payer: Self-pay | Admitting: Internal Medicine

## 2019-01-02 ENCOUNTER — Telehealth: Payer: Self-pay | Admitting: Internal Medicine

## 2019-01-02 DIAGNOSIS — F419 Anxiety disorder, unspecified: Secondary | ICD-10-CM

## 2019-01-02 MED ORDER — ALPRAZOLAM 0.5 MG PO TABS: 0.5000 mg | ORAL_TABLET | Freq: Every evening | ORAL | 5 refills | Status: DC | PRN

## 2019-01-02 NOTE — Telephone Encounter (Signed)
Pharmacy called stating xanax was sent to the wrong pharmacy. They are requesting to have prescription sent to them. Please advise.   [facility], [place] [address]  Phone: [phone] Fax: [phone]  Not a 24 hour pharmacy; exact hours not known.

## 2019-01-02 NOTE — Telephone Encounter (Signed)
Resent alprazolam script to [facility] and canceled [employer] .

## 2019-01-02 NOTE — Progress Notes (Signed)
It printed , , so I send it to [facility]

## 2019-03-07 ENCOUNTER — Other Ambulatory Visit: Payer: Self-pay | Admitting: Internal Medicine

## 2019-04-12 ENCOUNTER — Other Ambulatory Visit: Payer: Self-pay | Admitting: Internal Medicine

## 2019-05-02 ENCOUNTER — Other Ambulatory Visit: Payer: Self-pay

## 2019-05-07 ENCOUNTER — Other Ambulatory Visit: Payer: Self-pay

## 2019-05-07 ENCOUNTER — Encounter: Payer: Self-pay | Admitting: Internal Medicine

## 2019-05-07 ENCOUNTER — Ambulatory Visit (INDEPENDENT_AMBULATORY_CARE_PROVIDER_SITE_OTHER): Payer: No Typology Code available for payment source | Admitting: Internal Medicine

## 2019-05-07 ENCOUNTER — Other Ambulatory Visit: Payer: Self-pay | Admitting: Internal Medicine

## 2019-05-07 VITALS — BP 132/90 | HR 91 | Temp 98.5°F | Ht 74.0 in | Wt 308.2 lb

## 2019-05-07 DIAGNOSIS — F419 Anxiety disorder, unspecified: Secondary | ICD-10-CM

## 2019-05-07 DIAGNOSIS — E032 Hypothyroidism due to medicaments and other exogenous substances: Secondary | ICD-10-CM

## 2019-05-07 DIAGNOSIS — H60541 Acute eczematoid otitis externa, right ear: Secondary | ICD-10-CM | POA: Insufficient documentation

## 2019-05-07 DIAGNOSIS — E119 Type 2 diabetes mellitus without complications: Secondary | ICD-10-CM

## 2019-05-07 DIAGNOSIS — K760 Fatty (change of) liver, not elsewhere classified: Secondary | ICD-10-CM

## 2019-05-07 DIAGNOSIS — R5383 Other fatigue: Secondary | ICD-10-CM

## 2019-05-07 MED ORDER — LANTUS SOLOSTAR 100 UNIT/ML ~~LOC~~ SOPN: 20.0000 [IU] | PEN_INJECTOR | Freq: Every day | SUBCUTANEOUS | 2 refills | Status: DC

## 2019-05-07 MED ORDER — MOMETASONE FUROATE 0.1 % EX CREA: 1.0000 "application " | TOPICAL_CREAM | Freq: Every day | CUTANEOUS | 0 refills | Status: DC

## 2019-05-07 NOTE — Assessment & Plan Note (Signed)
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  He has reduced his use of alcohol and is requesting formal therapy to help manage his stressors. Referral to [facility]

## 2019-05-07 NOTE — Progress Notes (Signed)
Subjective:  Patient ID: Steven Hoover, male    DOB: 01-23-1971  Age: 49 y.o. MRN: [id number]  CC: The primary encounter diagnosis was Fatty liver disease, nonalcoholic. Diagnoses of Diabetes mellitus without complication ([place]), Hypothyroidism, iatrogenic, Anxiety, and Caregiver with fatigue were also pertinent to this visit.  HPI Steven Hoover presents for follow up on type 2 DM and  counselling on alcohol use,  Requested by patient.  This visit occurred during the SARS-CoV-2 public health emergency.  Safety protocols were in place, including screening questions prior to the visit, additional usage of staff PPE, and extensive cleaning of exam room while observing appropriate contact time as indicated for disinfecting solutions.    Patient has received both doses of the available COVID 19 vaccine without complications.  Patient continues to mask when outside of the home except when walking in yard or at safe distances from others .  Patient denies any change in mood or development of unhealthy behaviors resuting from the pandemic's restriction of activities and socialization.    Anxiety:  Noticed he was using alcohol to manage his anxiety.  SAW AN INCREASE IN USE around thanksgiving.    Work stressors , lack of leadership and direction leading to abuse of talents have led to lack of motivation .  Stretched too thin.   Does get to work from home 2-3 days per week.  No drinking during the day  Jake being home schooled via remote learning  Caregiver fatigue caring for parents in [place]  Did not drink until age 59 due to father's history. Father would drink,  Then sleep,  Then eat dinner,  Then drink more.  Was not abusive  Parents both alive in [place],  Father is in wheelchair and needs help with transportation.  Diagnosed with PD . Mother gets worn out,  He has 2 brothers who live closeby.   Current use:  Maximal daily intake was  3/4 fifth of vodka , not weekly. . Most  days drank 2-3 drinks per night. 6 to 8 shots per night)  And having hangovers next morning.  Has reduced use to not nightly,  And when he does drink he drinks 2 beers.   Wants to see therapist .discussed seeing Debbe Bales 2 semesters away from [facility] in [place] administration.  Limited to evenings  5 Pm  Until  ? Hangovers yes insomonia Weight gain 4 lbs  Family history  Father is a sober alcoholic   Right ear feels tight ,  Then flutters   History of bilateral tympanostomy tubes .  Right ear itches.      Outpatient Medications Prior to Visit  Medication Sig Dispense Refill  . Accu-Chek FastClix Lancets MISC USE AS INSTRUCTED 102 each 5  . ACCU-CHEK GUIDE test strip USE AS INSTRUCTED TO CHECK BLOOD SUGARS TWICE A DAY. 100 each 5  . ALPRAZolam (XANAX) 0.5 MG tablet Take 1 tablet (0.5 mg total) by mouth at bedtime as needed for anxiety. 30 tablet 5  . hydrochlorothiazide (MICROZIDE) 12.5 MG capsule TAKE 1 CAPSULE BY MOUTH ONCE DAILY 90 capsule 1  . levothyroxine (SYNTHROID) 125 MCG tablet TAKE 2 TABLETS BY MOUTH DAILY. 180 tablet 0  . losartan (COZAAR) 50 MG tablet TAKE 1 TABLET BY MOUTH DAILY. 90 tablet 1  . TRULICITY A999333 0000000 SOPN INJECT 0.75 MG INTO SKIN ONCE WEEKLY 2 mL 2  . UNIFINE PENTIPS 31G X 8 MM MISC USE WITH PENS AS DIRECTED 100 each 3  . LANTUS  SOLOSTAR 100 UNIT/ML Solostar Pen INJECT 20 UNITS INTO THE SKIN DAILY AT 10 PM. 15 mL 2  . busPIRone (BUSPAR) 7.5 MG tablet Take 1 tablet (7.5 mg total) by mouth 3 (three) times daily. (Patient not taking: Reported on 05/07/2019) 90 tablet 1  . VICTOZA 18 MG/3ML SOPN INJECT 1.8 MG SUBCUTANEOUSLY ONCE DAILY 9 mL 2   No facility-administered medications prior to visit.    Review of Systems;  Patient denies headache, fevers, malaise, unintentional weight loss, skin rash, eye pain, sinus congestion and sinus pain, sore throat, dysphagia,  hemoptysis , cough, dyspnea, wheezing, chest pain, palpitations, orthopnea, edema, abdominal  pain, nausea, melena, diarrhea, constipation, flank pain, dysuria, hematuria, urinary  Frequency, nocturia, numbness, tingling, seizures,  Focal weakness, Loss of consciousness,  Tremor, insomnia, depression, anxiety, and suicidal ideation.      Objective:  BP 132/90   Pulse 91   Temp 98.5 F (36.9 C) (Temporal)   Ht 6\' 2"  (1.88 m)   Wt (!) 308 lb 3.2 oz (139.8 kg)   SpO2 99%   BMI 39.57 kg/m   BP Readings from Last 3 Encounters:  05/07/19 132/90  12/15/18 120/80  12/13/17 122/86    Wt Readings from Last 3 Encounters:  05/07/19 (!) 308 lb 3.2 oz (139.8 kg)  12/15/18 (!) 304 lb 3.2 oz (138 kg)  05/18/18 292 lb (132.5 kg)    General appearance: alert, cooperative and appears stated age Ears: normal TM's and external ear canals both ears Throat: lips, mucosa, and tongue normal; teeth and gums normal Neck: no adenopathy, no carotid bruit, supple, symmetrical, trachea midline and thyroid not enlarged, symmetric, no tenderness/mass/nodules Back: symmetric, no curvature. ROM normal. No CVA tenderness. Lungs: clear to auscultation bilaterally Heart: regular rate and rhythm, S1, S2 normal, no murmur, click, rub or gallop Abdomen: soft, non-tender; bowel sounds normal; no masses,  no organomegaly Pulses: 2+ and symmetric Skin: Skin color, texture, turgor normal. No rashes or lesions Lymph nodes: Cervical, supraclavicular, and axillary nodes normal.  Lab Results  Component Value Date   HGBA1C 5.8 (H) 12/15/2018   HGBA1C 5.9 05/23/2018   HGBA1C 5.3 12/13/2017    Lab Results  Component Value Date   CREATININE 0.94 12/15/2018   CREATININE 0.89 05/23/2018   CREATININE 1.05 12/13/2017    Lab Results  Component Value Date   WBC 7.3 03/11/2015   HGB 16.5 03/11/2015   HCT 49.0 03/11/2015   PLT 274.0 03/11/2015   GLUCOSE 88 12/15/2018   CHOL 109 12/15/2018   TRIG 65 12/15/2018   HDL 54 12/15/2018   LDLDIRECT 50.0 11/08/2016   LDLCALC 41 12/15/2018   ALT 37 12/15/2018    AST 29 12/15/2018   NA 134 (L) 12/15/2018   K 3.8 12/15/2018   CL 96 (L) 12/15/2018   CREATININE 0.94 12/15/2018   BUN 11 12/15/2018   CO2 27 12/15/2018   TSH 0.67 12/15/2018   HGBA1C 5.8 (H) 12/15/2018   MICROALBUR 0.3 12/15/2018    MR Lumbar Spine Wo Contrast  Result Date: 04/22/2016 CLINICAL DATA:  Low back pain with sciatica. Left leg pain and numbness. EXAM: MRI LUMBAR SPINE WITHOUT CONTRAST TECHNIQUE: Multiplanar, multisequence MR imaging of the lumbar spine was performed. No intravenous contrast was administered. COMPARISON:  MRI 05/21/2009 FINDINGS: Segmentation:  Normal Alignment:  Normal Vertebrae: Negative for fracture or mass. Mild discogenic changes at L4-5. Conus medullaris: Extends to the L1-2 level and appears normal. Paraspinal and other soft tissues: Negative Disc levels: L1-2:  Negative L2-3:  Negative L3-4: Mild disc and facet degeneration with mild spinal stenosis. There is an element of mild congenital stenosis of the canal. L4-5: Disc degeneration with disc space narrowing. Disc bulging and diffuse endplate spurring similar to the prior study. Improvement in small central disc protrusion since the prior study. Improvement in spinal stenosis. There is mild to moderate spinal stenosis. Mild facet degeneration. Mild subarticular stenosis left greater than right. L5-S1:  Negative IMPRESSION: Mild to moderate spinal stenosis at L4-5 with improvement since 2011. Mild subarticular stenosis at L4-5 left greater than right Mild spinal stenosis L3-4. Electronically Signed   By: Franchot Gallo M.D.   On: 04/22/2016 08:50    Assessment & Plan:   Problem List Items Addressed This Visit      Unprioritized   Anxiety    Work stressors greatly contributing along with caregiver anxiety re aging patients.  He has reduced his use of alcohol and is requesting formal therapy to help manage his stressors. Referral to [facility]      Relevant Orders   Ambulatory referral to Psychology    Fatty liver disease, nonalcoholic - Primary   Relevant Orders   Comprehensive metabolic panel   Diabetes mellitus without complication ([place])   Relevant Medications   insulin glargine (LANTUS SOLOSTAR) 100 UNIT/ML Solostar Pen   Other Relevant Orders   Hemoglobin A1c   Hypothyroidism, iatrogenic   Relevant Orders   TSH   T4, free    Other Visit Diagnoses    Caregiver with fatigue       Relevant Orders   Ambulatory referral to Psychology      I have discontinued Lendon Ka. Trainer "Billy"'s Victoza. I have also changed his Lantus SoloStar. Additionally, I am having him start on mometasone. Lastly, I am having him maintain his busPIRone, Accu-Chek Guide, Accu-Chek FastClix Lancets, Unifine Pentips, hydrochlorothiazide, losartan, ALPRAZolam, levothyroxine, and Trulicity.  Meds ordered this encounter  Medications  . insulin glargine (LANTUS SOLOSTAR) 100 UNIT/ML Solostar Pen    Sig: Inject 20 Units into the skin daily at 10 pm.    Dispense:  15 mL    Refill:  2  . mometasone (ELOCON) 0.1 % cream    Sig: Apply 1 application topically daily.    Dispense:  45 g    Refill:  0    Medications Discontinued During This Encounter  Medication Reason  . VICTOZA 18 MG/3ML SOPN Patient Preference  . LANTUS SOLOSTAR 100 UNIT/ML Solostar Pen Reorder   I provided  30 minutes of  face-to-face time during this encounter reviewing patient's current problems  providing counseling on the above mentioned issue  and coordination  of care .  Follow-up: No follow-ups on file.   Crecencio Mc, MD

## 2019-05-07 NOTE — Assessment & Plan Note (Signed)
Tra[place] of [place] ointment

## 2019-05-07 NOTE — Patient Instructions (Signed)
Your right ear canal is enflamed (red),  But there is no sign of infection  It could be eczema.    I want you to try applying the steroid ointment once daily with a q tip.    If no improvement in 1-2 weeks,  ENT eval recommended    Referral to Orlie Dakin  (counselling) is in progress

## 2019-05-08 LAB — T4, FREE: Free T4: 1.39 ng/dL (ref 0.60–1.60)

## 2019-05-08 LAB — COMPREHENSIVE METABOLIC PANEL
ALT: 47 U/L (ref 0–53)
AST: 32 U/L (ref 0–37)
Albumin: 4.5 g/dL (ref 3.5–5.2)
Alkaline Phosphatase: 54 U/L (ref 39–117)
BUN: 11 mg/dL (ref 6–23)
CO2: 27 mEq/L (ref 19–32)
Calcium: 9.8 mg/dL (ref 8.4–10.5)
Chloride: 102 mEq/L (ref 96–112)
Creatinine, Ser: 0.86 mg/dL (ref 0.40–1.50)
GFR: 94.64 mL/min (ref >60.00)
Glucose, Bld: 105 mg/dL — ABNORMAL HIGH (ref 70–99)
Potassium: 3.9 mEq/L (ref 3.5–5.1)
Sodium: 138 mEq/L (ref 135–145)
Total Bilirubin: 0.6 mg/dL (ref 0.2–1.2)
Total Protein: 7.1 g/dL (ref 6.0–8.3)

## 2019-05-08 LAB — TSH: TSH: 1.03 u[IU]/mL (ref 0.35–4.50)

## 2019-05-08 LAB — HEMOGLOBIN A1C: Hgb A1c MFr Bld: 6.1 % (ref 4.6–6.5)

## 2019-05-22 ENCOUNTER — Telehealth: Payer: Self-pay

## 2019-05-22 MED ORDER — BASAGLAR KWIKPEN 100 UNIT/ML ~~LOC~~ SOPN: 20.0000 [IU] | PEN_INJECTOR | Freq: Every day | SUBCUTANEOUS | 1 refills | Status: DC

## 2019-05-22 NOTE — Telephone Encounter (Signed)
Basaglar rx sent

## 2019-05-22 NOTE — Telephone Encounter (Signed)
Received a fax from [facility] stating the lantus is not covered by pt's insurance and would like a rx sent in for [employer].

## 2019-05-22 NOTE — Addendum Note (Signed)
Addended by: Crecencio Mc on: 05/22/2019 11:07 AM   Modules accepted: Orders

## 2019-06-19 ENCOUNTER — Ambulatory Visit (INDEPENDENT_AMBULATORY_CARE_PROVIDER_SITE_OTHER): Payer: No Typology Code available for payment source | Admitting: Psychology

## 2019-06-19 DIAGNOSIS — F4323 Adjustment disorder with mixed anxiety and depressed mood: Secondary | ICD-10-CM | POA: Diagnosis not present

## 2019-06-20 ENCOUNTER — Other Ambulatory Visit: Payer: Self-pay

## 2019-06-21 MED ORDER — LEVOTHYROXINE SODIUM 125 MCG PO TABS: 250.0000 ug | ORAL_TABLET | Freq: Every day | ORAL | 0 refills | Status: DC

## 2019-07-03 ENCOUNTER — Ambulatory Visit (INDEPENDENT_AMBULATORY_CARE_PROVIDER_SITE_OTHER): Payer: No Typology Code available for payment source | Admitting: Psychology

## 2019-07-03 DIAGNOSIS — F4323 Adjustment disorder with mixed anxiety and depressed mood: Secondary | ICD-10-CM | POA: Diagnosis not present

## 2019-07-09 ENCOUNTER — Other Ambulatory Visit: Payer: Self-pay | Admitting: Internal Medicine

## 2019-07-17 ENCOUNTER — Ambulatory Visit (INDEPENDENT_AMBULATORY_CARE_PROVIDER_SITE_OTHER): Payer: No Typology Code available for payment source | Admitting: Psychology

## 2019-07-17 DIAGNOSIS — F4323 Adjustment disorder with mixed anxiety and depressed mood: Secondary | ICD-10-CM | POA: Diagnosis not present

## 2019-07-31 ENCOUNTER — Ambulatory Visit: Payer: No Typology Code available for payment source | Admitting: Psychology

## 2019-08-14 ENCOUNTER — Ambulatory Visit: Payer: No Typology Code available for payment source | Admitting: Psychology

## 2019-08-28 ENCOUNTER — Ambulatory Visit (INDEPENDENT_AMBULATORY_CARE_PROVIDER_SITE_OTHER): Payer: No Typology Code available for payment source | Admitting: Psychology

## 2019-08-28 DIAGNOSIS — F4323 Adjustment disorder with mixed anxiety and depressed mood: Secondary | ICD-10-CM

## 2019-09-20 ENCOUNTER — Ambulatory Visit: Payer: No Typology Code available for payment source | Admitting: Psychology

## 2019-09-24 ENCOUNTER — Other Ambulatory Visit: Payer: Self-pay | Admitting: Internal Medicine

## 2019-09-24 DIAGNOSIS — F419 Anxiety disorder, unspecified: Secondary | ICD-10-CM

## 2019-09-24 NOTE — Telephone Encounter (Signed)
Refill request for xanax, last seen 05-07-19, last filled 01-02-19.  Please advise.

## 2019-10-05 ENCOUNTER — Other Ambulatory Visit: Payer: Self-pay | Admitting: Internal Medicine

## 2019-11-07 ENCOUNTER — Other Ambulatory Visit: Payer: Self-pay

## 2019-11-07 ENCOUNTER — Ambulatory Visit (INDEPENDENT_AMBULATORY_CARE_PROVIDER_SITE_OTHER): Payer: No Typology Code available for payment source | Admitting: Internal Medicine

## 2019-11-07 ENCOUNTER — Encounter: Payer: Self-pay | Admitting: Internal Medicine

## 2019-11-07 ENCOUNTER — Other Ambulatory Visit: Payer: Self-pay | Admitting: Internal Medicine

## 2019-11-07 ENCOUNTER — Ambulatory Visit (INDEPENDENT_AMBULATORY_CARE_PROVIDER_SITE_OTHER): Payer: No Typology Code available for payment source

## 2019-11-07 VITALS — BP 114/82 | HR 83 | Temp 98.1°F | Resp 15 | Ht 74.0 in | Wt 306.0 lb

## 2019-11-07 DIAGNOSIS — M25572 Pain in left ankle and joints of left foot: Secondary | ICD-10-CM | POA: Diagnosis not present

## 2019-11-07 DIAGNOSIS — M79672 Pain in left foot: Secondary | ICD-10-CM | POA: Diagnosis not present

## 2019-11-07 DIAGNOSIS — E119 Type 2 diabetes mellitus without complications: Secondary | ICD-10-CM | POA: Diagnosis not present

## 2019-11-07 DIAGNOSIS — K760 Fatty (change of) liver, not elsewhere classified: Secondary | ICD-10-CM | POA: Diagnosis not present

## 2019-11-07 DIAGNOSIS — Z23 Encounter for immunization: Secondary | ICD-10-CM | POA: Diagnosis not present

## 2019-11-07 DIAGNOSIS — E032 Hypothyroidism due to medicaments and other exogenous substances: Secondary | ICD-10-CM

## 2019-11-07 MED ORDER — TRAMADOL HCL 50 MG PO TABS: 50.0000 mg | ORAL_TABLET | Freq: Four times a day (QID) | ORAL | 0 refills | Status: DC | PRN

## 2019-11-07 MED ORDER — MELOXICAM 15 MG PO TABS: 15.0000 mg | ORAL_TABLET | Freq: Every day | ORAL | 0 refills | Status: DC

## 2019-11-07 NOTE — Patient Instructions (Signed)
Referral to Dr Vickki Muff in progress   meloxicam instead of ibuprofen   Tramadol for additional pain control,  May use every 6 hours if needed   I suspect you may have a Collapsed arch (Charcot Joint)

## 2019-11-07 NOTE — Progress Notes (Signed)
Subjective:  Patient ID: Steven Hoover, male    DOB: [birth year]  Age: 49 y.o. MRN: [id number]  CC: The primary encounter diagnosis was Acute left ankle pain. Diagnoses of Foot arch pain, left, Diabetes mellitus without complication ([place]), Fatty liver disease, nonalcoholic, Hypothyroidism, iatrogenic, and Need for immunization against influenza were also pertinent to this visit.  HPI GEORGE HAGGART presents for evaluation of persistent left medial ankle pain x 1 months  This visit occurred during the SARS-CoV-2 public health emergency.  Safety protocols were in place, including screening questions prior to the visit, additional usage of staff PPE, and extensive cleaning of exam room while observing appropriate contact time as indicated for disinfecting solutions.    He reports no precedent  injury or unusual/prolonged activity (no long hikes or  beach walk),  And he has noticed no bruising.  He reports the development of pain on the medial side of his left ankle , below the tibia, that has become quite severe at times and radiates to the arch of his foot.  The severity  of the pain waxes and wanes, and is aggravated by plantar flexion and weight bearing. The pain is accompanied by swelling of the medial side of ankle without erythema or heat. He has been treating himself with ibuprofen 600 mg bid, and ice.  Has not tried maximal doses of tylenol   He has type 2 DM ,  Obesity and pes planus   ferral to dr Vickki Muff  Discussed    Outpatient Medications Prior to Visit  Medication Sig Dispense Refill  . Accu-Chek FastClix Lancets MISC USE AS INSTRUCTED 102 each 5  . ALPRAZolam (XANAX) 0.5 MG tablet TAKE 1 TABLET BY MOUTH AT BEDTIME AS NEEDED FOR ANXIETY. 30 tablet 1  . FREESTYLE LITE test strip USE AS INSTRUCTED TO CHECK BLOOD SUGARS TWICE A DAY. 100 strip 5  . hydrochlorothiazide (MICROZIDE) 12.5 MG capsule TAKE 1 CAPSULE BY MOUTH ONCE DAILY 90 capsule 1  . Insulin Glargine (BASAGLAR  KWIKPEN) 100 UNIT/ML Inject 0.2 mLs (20 Units total) into the skin daily. 15 mL 1  . levothyroxine (SYNTHROID) 125 MCG tablet TAKE 2 TABLETS (250 MCG TOTAL) BY MOUTH DAILY. 180 tablet 0  . losartan (COZAAR) 50 MG tablet TAKE 1 TABLET BY MOUTH DAILY. 90 tablet 1  . TRULICITY 9.37 JI/9.6VE SOPN INJECT 0.75 MG INTO SKIN ONCE WEEKLY 2 mL 2  . UNIFINE PENTIPS 31G X 8 MM MISC USE WITH PENS AS DIRECTED 100 each 3  . busPIRone (BUSPAR) 7.5 MG tablet Take 1 tablet (7.5 mg total) by mouth 3 (three) times daily. (Patient not taking: Reported on 05/07/2019) 90 tablet 1  . mometasone (ELOCON) 0.1 % cream Apply 1 application topically daily. 45 g 0   No facility-administered medications prior to visit.    Review of Systems;  Patient denies headache, fevers, malaise, unintentional weight loss, skin rash, eye pain, sinus congestion and sinus pain, sore throat, dysphagia,  hemoptysis , cough, dyspnea, wheezing, chest pain, palpitations, orthopnea, edema, abdominal pain, nausea, melena, diarrhea, constipation, flank pain, dysuria, hematuria, urinary  Frequency, nocturia, numbness, tingling, seizures,  Focal weakness, Loss of consciousness,  Tremor, insomnia, depression, anxiety, and suicidal ideation.      Objective:  BP 114/82 (BP Location: Left Arm, Patient Position: Sitting, Cuff Size: Large)   Pulse 83   Temp 98.1 F (36.7 C) (Oral)   Resp 15   Ht 6\' 2"  (1.88 m)   Wt (!) 306 lb (138.8 kg)  SpO2 99%   BMI 39.29 kg/m   BP Readings from Last 3 Encounters:  11/07/19 114/82  05/07/19 132/90  12/15/18 120/80    Wt Readings from Last 3 Encounters:  11/07/19 (!) 306 lb (138.8 kg)  05/07/19 (!) 308 lb 3.2 oz (139.8 kg)  12/15/18 (!) 304 lb 3.2 oz (138 kg)    General appearance: alert, cooperative and appears stated age Ears: normal TM's and external ear canals both ears Throat: lips, mucosa, and tongue normal; teeth and gums normal Neck: no adenopathy, no carotid bruit, supple, symmetrical,  trachea midline and thyroid not enlarged, symmetric, no tenderness/mass/nodules Back: symmetric, no curvature. ROM normal. No CVA tenderness. Lungs: clear to auscultation bilaterally Heart: regular rate and rhythm, S1, S2 normal, no murmur, click, rub or gallop Abdomen: soft, non-tender; bowel sounds normal; no masses,  no organomegaly Pulses: 2+ and symmetric Skin: Skin color, texture, turgor normal. No rashes or lesions, no plantar ulcerations  EXT:  Bilateral pes planus.  No joint heat pr redness.  Tender to palpation distal to medial malleolus  Lymph nodes: Cervical, supraclavicular, and axillary nodes normal.  Lab Results  Component Value Date   HGBA1C 6.1 05/07/2019   HGBA1C 5.8 (H) 12/15/2018   HGBA1C 5.9 05/23/2018    Lab Results  Component Value Date   CREATININE 0.86 05/07/2019   CREATININE 0.94 12/15/2018   CREATININE 0.89 05/23/2018    Lab Results  Component Value Date   WBC 7.3 03/11/2015   HGB 16.5 03/11/2015   HCT 49.0 03/11/2015   PLT 274.0 03/11/2015   GLUCOSE 105 (H) 05/07/2019   CHOL 109 12/15/2018   TRIG 65 12/15/2018   HDL 54 12/15/2018   LDLDIRECT 50.0 11/08/2016   LDLCALC 41 12/15/2018   ALT 47 05/07/2019   AST 32 05/07/2019   NA 138 05/07/2019   K 3.9 05/07/2019   CL 102 05/07/2019   CREATININE 0.86 05/07/2019   BUN 11 05/07/2019   CO2 27 05/07/2019   TSH 1.03 05/07/2019   HGBA1C 6.1 05/07/2019   MICROALBUR 0.3 12/15/2018     Assessment & Plan:   Problem List Items Addressed This Visit      Unprioritized   Acute left ankle pain - Primary    There are no signs of gouty erosions or stress fractures on plain films . Advanced degenerative changes and bone spurring noted.  Referral to Dr. Vickki Muff for management.       Relevant Orders   DG Ankle Complete Left (Completed)   Ambulatory referral to Podiatry   Diabetes mellitus without complication ([place])   Relevant Orders   Hemoglobin A1c   Microalbumin / creatinine urine ratio   Fatty  liver disease, nonalcoholic   Relevant Orders   Comprehensive metabolic panel   Foot arch pain, left    He is at risk for Charcot Joint due to history of diabetes and BMI > 40.  X rays show loss of joint spaces.  Refer to dr Vickki Muff       Relevant Orders   DG Foot Complete Left (Completed)   Ambulatory referral to Podiatry   Hypothyroidism, iatrogenic   Relevant Orders   TSH    Other Visit Diagnoses    Need for immunization against influenza       Relevant Orders   Flu Vaccine QUAD 36+ mos IM (Completed)     I provided  30 minutes of  face-to-face time during this encounter reviewing patient's current problems and past surgeries, labs and imaging studies,  providing counseling on the above mentioned problems , and coordination  of care .  I have discontinued Lendon Ka. [place] "Billy"'s busPIRone and mometasone. I am also having him start on meloxicam and traMADol. Additionally, I am having him maintain his Accu-Chek FastClix Lancets, Unifine Pentips, Basaglar KwikPen, losartan, hydrochlorothiazide, ALPRAZolam, FREESTYLE LITE, levothyroxine, and Trulicity.  Meds ordered this encounter  Medications  . meloxicam (MOBIC) 15 MG tablet    Sig: Take 1 tablet (15 mg total) by mouth daily.    Dispense:  30 tablet    Refill:  0  . traMADol (ULTRAM) 50 MG tablet    Sig: Take 1 tablet (50 mg total) by mouth every 6 (six) hours as needed for up to 7 days.    Dispense:  28 tablet    Refill:  0    Medications Discontinued During This Encounter  Medication Reason  . busPIRone (BUSPAR) 7.5 MG tablet Error  . mometasone (ELOCON) 0.1 % cream     Follow-up: No follow-ups on file.   Crecencio Mc, MD

## 2019-11-08 DIAGNOSIS — M25572 Pain in left ankle and joints of left foot: Secondary | ICD-10-CM | POA: Insufficient documentation

## 2019-11-08 DIAGNOSIS — M79672 Pain in left foot: Secondary | ICD-10-CM | POA: Insufficient documentation

## 2019-11-08 LAB — COMPREHENSIVE METABOLIC PANEL
ALT: 37 U/L (ref 0–53)
AST: 26 U/L (ref 0–37)
Albumin: 4.7 g/dL (ref 3.5–5.2)
Alkaline Phosphatase: 60 U/L (ref 39–117)
BUN: 10 mg/dL (ref 6–23)
CO2: 29 mEq/L (ref 19–32)
Calcium: 9.9 mg/dL (ref 8.4–10.5)
Chloride: 101 mEq/L (ref 96–112)
Creatinine, Ser: 1.17 mg/dL (ref 0.40–1.50)
GFR: 72.68 mL/min (ref >60.00)
Glucose, Bld: 111 mg/dL — ABNORMAL HIGH (ref 70–99)
Potassium: 4.1 mEq/L (ref 3.5–5.1)
Sodium: 140 mEq/L (ref 135–145)
Total Bilirubin: 0.7 mg/dL (ref 0.2–1.2)
Total Protein: 7.5 g/dL (ref 6.0–8.3)

## 2019-11-08 LAB — HEMOGLOBIN A1C: Hgb A1c MFr Bld: 6.2 % (ref 4.6–6.5)

## 2019-11-08 LAB — MICROALBUMIN / CREATININE URINE RATIO
Creatinine,U: 32.4 mg/dL
Microalb Creat Ratio: 2.2 mg/g (ref 0.0–30.0)
Microalb, Ur: <0.7 mg/dL (ref 0.0–1.9)

## 2019-11-08 NOTE — Assessment & Plan Note (Signed)
He is at risk for Charcot Joint due to history of diabetes and BMI > 40.  X rays show loss of joint spaces.  Refer to dr Vickki Muff

## 2019-11-08 NOTE — Assessment & Plan Note (Signed)
There are no signs of gouty erosions or stress fractures on plain films . Advanced degenerative changes and bone spurring noted.  Referral to Dr. Vickki Muff for management.

## 2019-11-09 LAB — TSH: TSH: 1.31 u[IU]/mL (ref 0.35–4.50)

## 2019-11-11 NOTE — Progress Notes (Signed)
Your thyroid function is normal  on your current levothyroxine mcg daily  dose.  Please continue your current dose and we will repeat in 6 months .  Your diabetes is under excellent control currently.  Please plan to return in 6  months for follow up on diabetes.   Fasting labs need to be done a day or two prior to visit so we can discuss them at your visit. Also, if you have not done so,  make sure you are seeing your eye doctor at least once a year.

## 2019-11-30 ENCOUNTER — Other Ambulatory Visit: Payer: Self-pay | Admitting: Internal Medicine

## 2019-12-18 ENCOUNTER — Other Ambulatory Visit: Payer: Self-pay | Admitting: Internal Medicine

## 2019-12-18 ENCOUNTER — Other Ambulatory Visit: Payer: Self-pay

## 2019-12-18 ENCOUNTER — Encounter: Payer: Self-pay | Admitting: Internal Medicine

## 2019-12-18 ENCOUNTER — Ambulatory Visit (INDEPENDENT_AMBULATORY_CARE_PROVIDER_SITE_OTHER): Payer: No Typology Code available for payment source | Admitting: Internal Medicine

## 2019-12-18 VITALS — BP 122/74 | HR 82 | Temp 98.5°F | Resp 15 | Ht 74.0 in | Wt 310.4 lb

## 2019-12-18 DIAGNOSIS — Z6841 Body Mass Index (BMI) 40.0 and over, adult: Secondary | ICD-10-CM

## 2019-12-18 DIAGNOSIS — Z0000 Encounter for general adult medical examination without abnormal findings: Secondary | ICD-10-CM

## 2019-12-18 DIAGNOSIS — G4733 Obstructive sleep apnea (adult) (pediatric): Secondary | ICD-10-CM | POA: Diagnosis not present

## 2019-12-18 DIAGNOSIS — E032 Hypothyroidism due to medicaments and other exogenous substances: Secondary | ICD-10-CM | POA: Diagnosis not present

## 2019-12-18 DIAGNOSIS — E119 Type 2 diabetes mellitus without complications: Secondary | ICD-10-CM | POA: Diagnosis not present

## 2019-12-18 MED ORDER — OMEPRAZOLE 40 MG PO CPDR: 40.0000 mg | DELAYED_RELEASE_CAPSULE | Freq: Every day | ORAL | 3 refills | Status: DC

## 2019-12-18 NOTE — Progress Notes (Signed)
Patient ID: Steven Hoover, male    DOB: Dec 11, 1970  Age: 49 y.o. MRN: [id number]  The patient is here for annual wellness examination and management of other chronic and acute problems.   The risk factors are reflected in the social history.  The roster of all physicians providing medical care to patient - is listed in the Snapshot section of the chart.  Activities of daily living:  The patient is 100% independent in all ADLs: dressing, toileting, feeding as well as independent mobility  Home safety : The patient has smoke detectors in the home. They wear seatbelts.  There are no firearms at home. There is no violence in the home.   There is no risks for hepatitis, STDs or HIV. There is no   history of blood transfusion. They have no travel history to infectious disease endemic areas of the world.  The patient has seen their dentist in the last six month. They have seen their eye doctor in the last year. They admit to slight hearing difficulty with regard to whispered voices and some television programs.  They have deferred audiologic testing in the last year.  They do not  have excessive sun exposure. Discussed the need for sun protection: hats, long sleeves and use of sunscreen if there is significant sun exposure.   Diet: the importance of a healthy diet is discussed. They do have a healthy diet.  The benefits of regular aerobic exercise were discussed. He is having difficult due to left ankle issues .   Depression screen: there are no  signs or vegative symptoms of depression- irritability, change in appetite, anhedonia, sadness/tearfullness.  Has stopped drinking alcohol on a nightly basis    The following portions of the patient's history were reviewed and updated as appropriate: allergies, current medications, past family history, past medical history,  past surgical history, past social history  and problem list.  Visual acuity was not assessed per patient preference since she has  regular follow up with her ophthalmologist. Hearing and body mass index were assessed and reviewed.   During the course of the visit the patient was educated and counseled about appropriate screening and preventive services including : fall prevention , diabetes screening, nutrition counseling, colorectal cancer screening, and recommended immunizations.    CC: The primary encounter diagnosis was Diabetes mellitus without complication ([employer]). Diagnoses of Hypothyroidism, iatrogenic, OSA (obstructive sleep apnea), Routine general medical examination at a health care facility, and Morbid obesity with BMI of 40.0-44.9, adult [facility]) were also pertinent to this visit.  Stress:  Uses alprazolam 2/week . Using breathing techniques and "Mood" app.   2 or 3 classes left to finish masters in science and [place] has a past medical history of Anxiety, Back pain, Cancer ([place]), Depression, Diabetes mellitus without complication ([place]), Hypertension, OSA on CPAP (2011), Sinusitis, and Thyroid disease.   He has a past surgical history that includes Total thyroidectomy (2004); adnoidectomy; and Colonoscopy with propofol (N/A, 06/28/2014).   His family history includes Diabetes in his father; Parkinson's disease (age of onset: 40) in his father.He reports that he quit smoking about 9 years ago. He has never used smokeless tobacco. He reports current alcohol use of about 5.0 standard drinks of alcohol per week. He reports that he does not use drugs.  Outpatient Medications Prior to Visit  Medication Sig Dispense Refill  . Accu-Chek FastClix Lancets MISC USE AS INSTRUCTED 102 each 5  . ALPRAZolam (XANAX) 0.5 MG tablet  TAKE 1 TABLET BY MOUTH AT BEDTIME AS NEEDED FOR ANXIETY. 30 tablet 1  . FREESTYLE LITE test strip USE AS INSTRUCTED TO CHECK BLOOD SUGARS TWICE A DAY. 100 strip 5  . hydrochlorothiazide (MICROZIDE) 12.5 MG capsule TAKE 1 CAPSULE BY MOUTH ONCE DAILY 90 capsule 1  .  Insulin Glargine (BASAGLAR KWIKPEN) 100 UNIT/ML Inject 0.2 mLs (20 Units total) into the skin daily. 15 mL 1  . levothyroxine (SYNTHROID) 125 MCG tablet TAKE 2 TABLETS (250 MCG TOTAL) BY MOUTH DAILY. 180 tablet 0  . losartan (COZAAR) 50 MG tablet TAKE 1 TABLET BY MOUTH DAILY. 90 tablet 1  . meloxicam (MOBIC) 15 MG tablet TAKE 1 TABLET (15 MG TOTAL) BY MOUTH DAILY. 30 tablet 0  . TRULICITY 4.81 EH/6.3JS SOPN INJECT 0.75 MG INTO SKIN ONCE WEEKLY 2 mL 2  . UNIFINE PENTIPS 31G X 8 MM MISC USE WITH PENS AS DIRECTED 100 each 3   No facility-administered medications prior to visit.    Review of Systems   Patient denies headache, fevers, malaise, unintentional weight loss, skin rash, eye pain, sinus congestion and sinus pain, sore throat, dysphagia,  hemoptysis , cough, dyspnea, wheezing, chest pain, palpitations, orthopnea, edema, abdominal pain, nausea, melena, diarrhea, constipation, flank pain, dysuria, hematuria, urinary  Frequency, nocturia, numbness, tingling, seizures,  Focal weakness, Loss of consciousness,  Tremor, insomnia, depression, anxiety, and suicidal ideation.     Objective:  BP 122/74 (BP Location: Left Arm, Patient Position: Sitting, Cuff Size: Large)   Pulse 82   Temp 98.5 F (36.9 C) (Oral)   Resp 15   Ht 6\' 2"  (1.88 m)   Wt (!) 310 lb 6.4 oz (140.8 kg)   SpO2 99%   BMI 39.85 kg/m   Physical Exam  General appearance: alert, cooperative and appears stated age Ears: normal TM's and external ear canals both ears Throat: lips, mucosa, and tongue normal; teeth and gums normal Neck: no adenopathy, no carotid bruit, supple, symmetrical, trachea midline and thyroid not enlarged, symmetric, no tenderness/mass/nodules Back: symmetric, no curvature. ROM normal. No CVA tenderness. Lungs: clear to auscultation bilaterally Heart: regular rate and rhythm, S1, S2 normal, no murmur, click, rub or gallop Abdomen: soft, non-tender; bowel sounds normal; no masses,  no  organomegaly Pulses: 2+ and symmetric Skin: Skin color, texture, turgor normal. No rashes or lesions Lymph nodes: Cervical, supraclavicular, and axillary nodes normal.   Assessment & Plan:   Problem List Items Addressed This Visit      Unprioritized   Diabetes mellitus without complication ([place]) - Primary   Relevant Orders   Lipid panel   Hemoglobin A1c   Comprehensive metabolic panel   Hypothyroidism, iatrogenic   Relevant Orders   TSH   Morbid obesity with BMI of 40.0-44.9, adult ([place])    I have addressed  BMI and recommended a low glycemic index diet utilizing smaller more frequent meals to increase metabolism.  I have also recommended that patient start exercising with a goal of 30 minutes of aerobic exercise a minimum of 5 days per week.         OSA (obstructive sleep apnea)    Remains Untreated due to intolerance of CPAP       Routine general medical examination at a health care facility    age appropriate education and counseling updated, referrals for preventative services and immunizations addressed, dietary and smoking counseling addressed, most recent labs reviewed.  I have personally reviewed and have noted:  1) the patient's medical and social history  2) The pt's use of alcohol, tobacco, and illicit drugs 3) The patient's current medications and supplements 4) Functional ability including ADL's, fall risk, home safety risk, hearing and visual impairment 5) Diet and physical activities 6) Evidence for depression or mood disorder 7) The patient's height, weight, and BMI have been recorded in the chart  I have made referrals, and provided counseling and education based on review of the above         I am having Lendon Ka. Mcferran "Billy" start on omeprazole. I am also having him maintain his Accu-Chek FastClix Lancets, Unifine Pentips, Basaglar KwikPen, losartan, hydrochlorothiazide, ALPRAZolam, FREESTYLE LITE, levothyroxine, Trulicity, and meloxicam.  Meds  ordered this encounter  Medications  . omeprazole (PRILOSEC) 40 MG capsule    Sig: Take 1 capsule (40 mg total) by mouth daily.    Dispense:  30 capsule    Refill:  3    There are no discontinued medications.  Follow-up: Return in about 6 months (around 06/16/2020) for follow up diabetes.   Crecencio Mc, MD

## 2019-12-18 NOTE — Patient Instructions (Signed)
Your labs look great!  We'll plan on repeating them along with fasting lipids in 6 months     Health Maintenance, Male Adopting a healthy lifestyle and getting preventive care are important in promoting health and wellness. Ask your health care provider about:  The right schedule for you to have regular tests and exams.  Things you can do on your own to prevent diseases and keep yourself healthy. What should I know about diet, weight, and exercise? Eat a healthy diet   Eat a diet that includes plenty of vegetables, fruits, low-fat dairy products, and lean protein.  Do not eat a lot of foods that are high in solid fats, added sugars, or sodium. Maintain a healthy weight Body mass index (BMI) is a measurement that can be used to identify possible weight problems. It estimates body fat based on height and weight. Your health care provider can help determine your BMI and help you achieve or maintain a healthy weight. Get regular exercise Get regular exercise. This is one of the most important things you can do for your health. Most adults should:  Exercise for at least 150 minutes each week. The exercise should increase your heart rate and make you sweat (moderate-intensity exercise).  Do strengthening exercises at least twice a week. This is in addition to the moderate-intensity exercise.  Spend less time sitting. Even light physical activity can be beneficial. Watch cholesterol and blood lipids Have your blood tested for lipids and cholesterol at 49 years of age, then have this test every 5 years. You may need to have your cholesterol levels checked more often if:  Your lipid or cholesterol levels are high.  You are older than 50 years of age.  You are at high risk for heart disease. What should I know about cancer screening? Many types of cancers can be detected early and may often be prevented. Depending on your health history and family history, you may need to have cancer  screening at various ages. This may include screening for:  Colorectal cancer.  Prostate cancer.  Skin cancer.  Lung cancer. What should I know about heart disease, diabetes, and high blood pressure? Blood pressure and heart disease  High blood pressure causes heart disease and increases the risk of stroke. This is more likely to develop in people who have high blood pressure readings, are of African descent, or are overweight.  Talk with your health care provider about your target blood pressure readings.  Have your blood pressure checked: ? Every 3-5 years if you are 49-82 years of age. ? Every year if you are 49 years old or older.  If you are between the ages of 26 and 71 and are a current or former smoker, ask your health care provider if you should have a one-time screening for abdominal aortic aneurysm (AAA). Diabetes Have regular diabetes screenings. This checks your fasting blood sugar level. Have the screening done:  Once every three years after age 39 if you are at a normal weight and have a low risk for diabetes.  More often and at a younger age if you are overweight or have a high risk for diabetes. What should I know about preventing infection? Hepatitis B If you have a higher risk for hepatitis B, you should be screened for this virus. Talk with your health care provider to find out if you are at risk for hepatitis B infection. Hepatitis C Blood testing is recommended for:  Everyone born from [birth year] through  [place] with known risk factors for hepatitis C. Sexually transmitted infections (STIs)  You should be screened each year for STIs, including gonorrhea and chlamydia, if: ? You are sexually active and are younger than 49 years of age. ? You are older than 49 years of age and your health care provider tells you that you are at risk for this type of infection. ? Your sexual activity has changed since you were last screened, and you are at increased risk  for chlamydia or gonorrhea. Ask your health care provider if you are at risk.  Ask your health care provider about whether you are at high risk for HIV. Your health care provider may recommend a prescription medicine to help prevent HIV infection. If you choose to take medicine to prevent HIV, you should first get tested for HIV. You should then be tested every 3 months for as long as you are taking the medicine. Follow these instructions at home: Lifestyle  Do not use any products that contain nicotine or tobacco, such as cigarettes, e-cigarettes, and chewing tobacco. If you need help quitting, ask your health care provider.  Do not use street drugs.  Do not share needles.  Ask your health care provider for help if you need support or information about quitting drugs. Alcohol use  Do not drink alcohol if your health care provider tells you not to drink.  If you drink alcohol: ? Limit how much you have to 0-2 drinks a day. ? Be aware of how much alcohol is in your drink. In the [place] one drink equals one 12 oz bottle of beer (355 mL), one 5 oz glass of wine (148 mL), or one 1 oz glass of hard liquor (44 mL). General instructions  Schedule regular health, dental, and eye exams.  Stay current with your vaccines.  Tell your health care provider if: ? You often feel depressed. ? You have ever been abused or do not feel safe at home. Summary  Adopting a healthy lifestyle and getting preventive care are important in promoting health and wellness.  Follow your health care provider's instructions about healthy diet, exercising, and getting tested or screened for diseases.  Follow your health care provider's instructions on monitoring your cholesterol and blood pressure. This information is not intended to replace advice given to you by your health care provider. Make sure you discuss any questions you have with your health care provider. Document Revised: 01/11/2018 Document Reviewed:  01/11/2018 Elsevier Patient Education  2020 [employer].

## 2019-12-18 NOTE — Assessment & Plan Note (Signed)

## 2019-12-18 NOTE — Assessment & Plan Note (Signed)
I have addressed  BMI and recommended a low glycemic index diet utilizing smaller more frequent meals to increase metabolism.  I have also recommended that patient start exercising with a goal of 30 minutes of aerobic exercise a minimum of 5 days per week.  

## 2019-12-18 NOTE — Assessment & Plan Note (Signed)
Remains Untreated due to intolerance of CPAP

## 2019-12-19 ENCOUNTER — Other Ambulatory Visit: Payer: Self-pay | Admitting: Internal Medicine

## 2019-12-25 ENCOUNTER — Other Ambulatory Visit: Payer: Self-pay | Admitting: Internal Medicine

## 2020-01-04 ENCOUNTER — Ambulatory Visit: Payer: No Typology Code available for payment source

## 2020-01-14 ENCOUNTER — Other Ambulatory Visit: Payer: Self-pay | Admitting: Internal Medicine

## 2020-01-14 DIAGNOSIS — F419 Anxiety disorder, unspecified: Secondary | ICD-10-CM

## 2020-01-14 NOTE — Telephone Encounter (Signed)
RX Refill:xanax Last Seen:12-18-19 Last ordered:09-24-19

## 2020-01-29 ENCOUNTER — Other Ambulatory Visit: Payer: Self-pay | Admitting: Internal Medicine

## 2020-01-29 ENCOUNTER — Ambulatory Visit: Payer: No Typology Code available for payment source | Attending: Internal Medicine

## 2020-01-29 ENCOUNTER — Ambulatory Visit: Payer: No Typology Code available for payment source

## 2020-01-29 DIAGNOSIS — Z23 Encounter for immunization: Secondary | ICD-10-CM

## 2020-01-29 NOTE — Progress Notes (Signed)
   Covid-19 Vaccination Clinic  Name:  Steven Hoover    MRN: [id number] DOB: 1970-12-01  01/29/2020  [name] was observed post Covid-19 immunization for 15 minutes without incident. He was provided with Vaccine Information Sheet and instruction to access the V-Safe system.   [name] was instructed to call 911 with any severe reactions post vaccine: Marland Kitchen Difficulty breathing  . Swelling of face and throat  . A fast heartbeat  . A bad rash all over body  . Dizziness and weakness   Immunizations Administered    Name Date Dose VIS Date Route   Pfizer COVID-19 Vaccine 01/29/2020 11:51 AM 0.3 mL 11/21/2019 Intramuscular   Manufacturer: [employer], [employer]   Lot: [id number]   NDC: [address]-0

## 2020-02-11 ENCOUNTER — Other Ambulatory Visit: Payer: Self-pay | Admitting: Internal Medicine

## 2020-02-11 LAB — HM DIABETES EYE EXAM

## 2020-02-14 ENCOUNTER — Other Ambulatory Visit: Payer: Self-pay | Admitting: Internal Medicine

## 2020-03-14 ENCOUNTER — Encounter: Payer: Self-pay | Admitting: Internal Medicine

## 2020-03-14 ENCOUNTER — Other Ambulatory Visit: Payer: Self-pay | Admitting: Internal Medicine

## 2020-03-14 ENCOUNTER — Ambulatory Visit (INDEPENDENT_AMBULATORY_CARE_PROVIDER_SITE_OTHER): Payer: No Typology Code available for payment source | Admitting: Internal Medicine

## 2020-03-14 ENCOUNTER — Other Ambulatory Visit: Payer: Self-pay

## 2020-03-14 DIAGNOSIS — E032 Hypothyroidism due to medicaments and other exogenous substances: Secondary | ICD-10-CM

## 2020-03-14 DIAGNOSIS — I10 Essential (primary) hypertension: Secondary | ICD-10-CM

## 2020-03-14 DIAGNOSIS — E119 Type 2 diabetes mellitus without complications: Secondary | ICD-10-CM | POA: Diagnosis not present

## 2020-03-14 DIAGNOSIS — H6993 Unspecified Eustachian tube disorder, bilateral: Secondary | ICD-10-CM

## 2020-03-14 DIAGNOSIS — H9313 Tinnitus, bilateral: Secondary | ICD-10-CM | POA: Diagnosis not present

## 2020-03-14 LAB — COMPREHENSIVE METABOLIC PANEL
ALT: 39 U/L (ref 0–53)
AST: 33 U/L (ref 0–37)
Albumin: 4.5 g/dL (ref 3.5–5.2)
Alkaline Phosphatase: 58 U/L (ref 39–117)
BUN: 12 mg/dL (ref 6–23)
CO2: 31 mEq/L (ref 19–32)
Calcium: 10.2 mg/dL (ref 8.4–10.5)
Chloride: 100 mEq/L (ref 96–112)
Creatinine, Ser: 0.96 mg/dL (ref 0.40–1.50)
GFR: 92.79 mL/min (ref >60.00)
Glucose, Bld: 87 mg/dL (ref 70–99)
Potassium: 4.3 mEq/L (ref 3.5–5.1)
Sodium: 138 mEq/L (ref 135–145)
Total Bilirubin: 0.9 mg/dL (ref 0.2–1.2)
Total Protein: 7.4 g/dL (ref 6.0–8.3)

## 2020-03-14 LAB — LIPID PANEL
Cholesterol: 114 mg/dL (ref 0–200)
HDL: 61.7 mg/dL (ref >39.00)
LDL Cholesterol: 44 mg/dL (ref 0–99)
NonHDL: 52.49
Total CHOL/HDL Ratio: 2
Triglycerides: 42 mg/dL (ref 0.0–149.0)
VLDL: 8.4 mg/dL (ref 0.0–40.0)

## 2020-03-14 LAB — TSH: TSH: 1.46 u[IU]/mL (ref 0.35–4.50)

## 2020-03-14 MED ORDER — PREDNISONE 10 MG PO TABS: ORAL_TABLET | ORAL | 0 refills | Status: DC

## 2020-03-14 MED ORDER — FLUTICASONE PROPIONATE 50 MCG/ACT NA SUSP: 2.0000 | Freq: Every day | NASAL | 6 refills | Status: DC

## 2020-03-14 MED ORDER — LOSARTAN POTASSIUM 100 MG PO TABS: 100.0000 mg | ORAL_TABLET | Freq: Every day | ORAL | 1 refills | Status: DC

## 2020-03-14 NOTE — Patient Instructions (Signed)
I am treating your for Eustachian tube dysfunction with:  1) prednisone taper 2) Flonase nasal spray 3) OTC Afrin nasal spray twice daily for 5 days only  Hearing test ordered   PLEASE INCREASE LOSARTAN TO 100 MG DAILY AND HAVE BP RECHECKED IN ONE WEEK  GOAL > 130/80

## 2020-03-14 NOTE — Progress Notes (Signed)
Subjective:  Patient ID: Steven Hoover, male    DOB: 1970/08/20  Age: 50 y.o. MRN: [id number]  CC: Diagnoses of Diabetes mellitus without complication ([place]), Hypothyroidism, iatrogenic, Tinnitus of both ears, Essential hypertension, benign, and Disorder of both eustachian tubes were pertinent to this visit.  HPI Steven Hoover presents for 3 WEEK HISTORY OF WORSENING TINNITUS AND EAR PRESSURE   HAS HAD INCREASED CONGESTION RECENTLY without facial pain, sore throat and fevers.   HISTORY OF MYRINGOTOMY TUBES,,HAS HAD TINNITUS SINCE THEN. RECURRENT SINUSITIS I N  20'S.LAST HEARING TEST NORMAL AT AGE 71  Outpatient Medications Prior to Visit  Medication Sig Dispense Refill  . Accu-Chek FastClix Lancets MISC USE AS INSTRUCTED 102 each 5  . ALPRAZolam (XANAX) 0.5 MG tablet TAKE 1 TABLET BY MOUTH AT BEDTIME AS NEEDED FOR ANXIETY. 30 tablet 1  . FREESTYLE LITE test strip USE AS INSTRUCTED TO CHECK BLOOD SUGARS TWICE A DAY. 100 strip 5  . hydrochlorothiazide (MICROZIDE) 12.5 MG capsule TAKE 1 CAPSULE BY MOUTH ONCE DAILY 90 capsule 1  . Insulin Glargine (BASAGLAR KWIKPEN) 100 UNIT/ML Inject 0.2 mLs (20 Units total) into the skin daily. 15 mL 1  . levothyroxine (SYNTHROID) 125 MCG tablet TAKE 2 TABLETS (250 MCG TOTAL) BY MOUTH DAILY. 180 tablet 0  . meloxicam (MOBIC) 15 MG tablet TAKE 1 TABLET (15 MG TOTAL) BY MOUTH DAILY. 30 tablet 0  . omeprazole (PRILOSEC) 40 MG capsule Take 1 capsule (40 mg total) by mouth daily. 30 capsule 3  . TRULICITY 6.22 WL/7.9GX SOPN INJECT 0.75 MG INTO SKIN ONCE WEEKLY 2 mL 2  . UNIFINE PENTIPS 31G X 8 MM MISC USE WITH PENS AS DIRECTED 100 each 3  . losartan (COZAAR) 50 MG tablet TAKE 1 TABLET BY MOUTH DAILY. 90 tablet 1   No facility-administered medications prior to visit.    Review of Systems;  Patient denies headache, fevers, malaise, unintentional weight loss, skin rash, eye pain,  sinus pain, sore throat, dysphagia,  hemoptysis , cough, dyspnea,  wheezing, chest pain, palpitations, orthopnea, edema, abdominal pain, nausea, melena, diarrhea, constipation, flank pain, dysuria, hematuria, urinary  Frequency, nocturia, numbness, tingling, seizures,  Focal weakness, Loss of consciousness,  Tremor, insomnia, depression, anxiety, and suicidal ideation.      Objective:  BP (!) 130/98 (BP Location: Left Arm, Patient Position: Sitting)   Pulse 99   Temp 98.3 F (36.8 C)   Ht 6' 2.02" (1.88 m)   Wt (!) 312 lb 12.8 oz (141.9 kg)   SpO2 99%   BMI 40.14 kg/m   BP Readings from Last 3 Encounters:  03/14/20 (!) 130/98  12/18/19 122/74  11/07/19 114/82    Wt Readings from Last 3 Encounters:  03/14/20 (!) 312 lb 12.8 oz (141.9 kg)  12/18/19 (!) 310 lb 6.4 oz (140.8 kg)  11/07/19 (!) 306 lb (138.8 kg)    General appearance: alert, cooperative and appears stated age Ears: normal TM's and external ear canals both ears Throat: lips, mucosa, and tongue normal; teeth and gums normal Neck: no adenopathy, no carotid bruit, supple, symmetrical, trachea midline and thyroid not enlarged, symmetric, no tenderness/mass/nodules Back: symmetric, no curvature. ROM normal. No CVA tenderness. Lungs: clear to auscultation bilaterally Heart: regular rate and rhythm, S1, S2 normal, no murmur, click, rub or gallop Abdomen: soft, non-tender; bowel sounds normal; no masses,  no organomegaly Pulses: 2+ and symmetric Skin: Skin color, texture, turgor normal. No rashes or lesions Lymph nodes: Cervical, supraclavicular, and axillary nodes normal.  Lab  Results  Component Value Date   HGBA1C 6.2 11/07/2019   HGBA1C 6.1 05/07/2019   HGBA1C 5.8 (H) 12/15/2018    Lab Results  Component Value Date   CREATININE 1.17 11/07/2019   CREATININE 0.86 05/07/2019   CREATININE 0.94 12/15/2018    Lab Results  Component Value Date   WBC 7.3 03/11/2015   HGB 16.5 03/11/2015   HCT 49.0 03/11/2015   PLT 274.0 03/11/2015   GLUCOSE 111 (H) 11/07/2019   CHOL 109  12/15/2018   TRIG 65 12/15/2018   HDL 54 12/15/2018   LDLDIRECT 50.0 11/08/2016   LDLCALC 41 12/15/2018   ALT 37 11/07/2019   AST 26 11/07/2019   NA 140 11/07/2019   K 4.1 11/07/2019   CL 101 11/07/2019   CREATININE 1.17 11/07/2019   BUN 10 11/07/2019   CO2 29 11/07/2019   TSH 1.31 11/07/2019   HGBA1C 6.2 11/07/2019   MICROALBUR <0.7 11/07/2019    MR Lumbar Spine Wo Contrast  Result Date: 04/22/2016 CLINICAL DATA:  Low back pain with sciatica. Left leg pain and numbness. EXAM: MRI LUMBAR SPINE WITHOUT CONTRAST TECHNIQUE: Multiplanar, multisequence MR imaging of the lumbar spine was performed. No intravenous contrast was administered. COMPARISON:  MRI 05/21/2009 FINDINGS: Segmentation:  Normal Alignment:  Normal Vertebrae: Negative for fracture or mass. Mild discogenic changes at L4-5. Conus medullaris: Extends to the L1-2 level and appears normal. Paraspinal and other soft tissues: Negative Disc levels: L1-2:  Negative L2-3:  Negative L3-4: Mild disc and facet degeneration with mild spinal stenosis. There is an element of mild congenital stenosis of the canal. L4-5: Disc degeneration with disc space narrowing. Disc bulging and diffuse endplate spurring similar to the prior study. Improvement in small central disc protrusion since the prior study. Improvement in spinal stenosis. There is mild to moderate spinal stenosis. Mild facet degeneration. Mild subarticular stenosis left greater than right. L5-S1:  Negative IMPRESSION: Mild to moderate spinal stenosis at L4-5 with improvement since 2011. Mild subarticular stenosis at L4-5 left greater than right Mild spinal stenosis L3-4. Electronically Signed   By: Franchot Gallo M.D.   On: 04/22/2016 08:50    Assessment & Plan:   Problem List Items Addressed This Visit      Unprioritized   Diabetes mellitus without complication ([place])   Relevant Medications   losartan (COZAAR) 100 MG tablet   Other Relevant Orders   Ambulatory referral to  Audiology   Essential hypertension, benign    Not at goal on current regimen.  Increase losartan dose to 19 mg daily       Relevant Medications   losartan (COZAAR) 100 MG tablet   Eustachian tube disorder    Prednisone taper,  flonase spray and afrin prn follow up with ENT if no improvement       Relevant Orders   Ambulatory referral to Audiology   Hypothyroidism, iatrogenic   Tinnitus of both ears    Referral for hearing test in progress to [facility]       Relevant Orders   Ambulatory referral to Audiology      I have changed Steven Ka. Solberg "Billy"'s losartan. I am also having him start on predniSONE and fluticasone. Additionally, I am having him maintain his Accu-Chek FastClix Lancets, Basaglar KwikPen, FREESTYLE LITE, omeprazole, levothyroxine, Trulicity, ALPRAZolam, hydrochlorothiazide, meloxicam, and Unifine Pentips.  Meds ordered this encounter  Medications  . predniSONE (DELTASONE) 10 MG tablet    Sig: 6 tablets on Day 1 , then reduce by 1  tablet daily until gone    Dispense:  21 tablet    Refill:  0  . fluticasone (FLONASE) 50 MCG/ACT nasal spray    Sig: Place 2 sprays into both nostrils daily.    Dispense:  16 g    Refill:  6  . losartan (COZAAR) 100 MG tablet    Sig: Take 1 tablet (100 mg total) by mouth daily.    Dispense:  90 tablet    Refill:  1    Medications Discontinued During This Encounter  Medication Reason  . losartan (COZAAR) 50 MG tablet     Follow-up: No follow-ups on file.   Crecencio Mc, MD

## 2020-03-15 DIAGNOSIS — H6990 Unspecified Eustachian tube disorder, unspecified ear: Secondary | ICD-10-CM | POA: Insufficient documentation

## 2020-03-15 DIAGNOSIS — H9313 Tinnitus, bilateral: Secondary | ICD-10-CM | POA: Insufficient documentation

## 2020-03-15 NOTE — Assessment & Plan Note (Signed)
Not at goal on current regimen.  Increase losartan dose to 19 mg daily

## 2020-03-15 NOTE — Assessment & Plan Note (Signed)
Referral for hearing test in progress to [facility]

## 2020-03-15 NOTE — Assessment & Plan Note (Signed)
Prednisone taper,  flonase spray and afrin prn follow up with ENT if no improvement

## 2020-03-17 LAB — HEMOGLOBIN A1C: Hgb A1c MFr Bld: 6.3 % (ref 4.6–6.5)

## 2020-03-24 ENCOUNTER — Other Ambulatory Visit: Payer: Self-pay | Admitting: Internal Medicine

## 2020-04-24 ENCOUNTER — Other Ambulatory Visit: Payer: Self-pay | Admitting: Internal Medicine

## 2020-04-25 ENCOUNTER — Other Ambulatory Visit: Payer: Self-pay | Admitting: Internal Medicine

## 2020-04-25 DIAGNOSIS — E119 Type 2 diabetes mellitus without complications: Secondary | ICD-10-CM

## 2020-04-25 MED ORDER — TRESIBA FLEXTOUCH 100 UNIT/ML ~~LOC~~ SOPN: 20.0000 [IU] | PEN_INJECTOR | Freq: Every day | SUBCUTANEOUS | 3 refills | Status: DC

## 2020-04-25 NOTE — Assessment & Plan Note (Signed)
REPLACING BASAGLAR WITH TRESIBA PER FORMULARY CHANGE IN COVERAGE PER PHARMACY REQUEST

## 2020-05-22 ENCOUNTER — Other Ambulatory Visit: Payer: Self-pay

## 2020-05-22 MED FILL — Dulaglutide Soln Auto-injector 0.75 MG/0.5ML: SUBCUTANEOUS | 28 days supply | Qty: 2 | Fill #0 | Status: AC

## 2020-05-22 MED FILL — Glucose Blood Test Strip: 50 days supply | Qty: 100 | Fill #0 | Status: AC

## 2020-05-22 MED FILL — Fluticasone Propionate Nasal Susp 50 MCG/ACT: NASAL | 30 days supply | Qty: 16 | Fill #0 | Status: AC

## 2020-05-22 MED FILL — Insulin Pen Needle 31 G X 8 MM (1/3" or 5/16"): 90 days supply | Qty: 100 | Fill #0 | Status: AC

## 2020-05-29 ENCOUNTER — Other Ambulatory Visit: Payer: Self-pay

## 2020-05-29 MED ORDER — AZELASTINE HCL 0.1 % NA SOLN
NASAL | 12 refills | Status: DC
  Filled 2020-05-29: qty 30, 30d supply, fill #0
  Filled 2020-07-25: qty 30, 30d supply, fill #1
  Filled 2020-09-01: qty 30, 30d supply, fill #2

## 2020-06-05 ENCOUNTER — Telehealth: Payer: Self-pay | Admitting: *Deleted

## 2020-06-05 DIAGNOSIS — E1169 Type 2 diabetes mellitus with other specified complication: Secondary | ICD-10-CM

## 2020-06-05 DIAGNOSIS — K760 Fatty (change of) liver, not elsewhere classified: Secondary | ICD-10-CM

## 2020-06-05 DIAGNOSIS — E032 Hypothyroidism due to medicaments and other exogenous substances: Secondary | ICD-10-CM

## 2020-06-05 DIAGNOSIS — E669 Obesity, unspecified: Secondary | ICD-10-CM | POA: Insufficient documentation

## 2020-06-05 NOTE — Telephone Encounter (Signed)
Please place future orders for lab appt.  

## 2020-06-05 NOTE — Telephone Encounter (Signed)
Please reschedule lab appt. Pt scheduled for labs too soon. Per Dr. Derrel Nip, needs to be done in early August (6 mth f/u)

## 2020-06-05 NOTE — Telephone Encounter (Signed)
Labs ordered and  should be done in early  august.  (6 month follow up)

## 2020-06-12 ENCOUNTER — Other Ambulatory Visit: Payer: No Typology Code available for payment source

## 2020-06-14 ENCOUNTER — Other Ambulatory Visit: Payer: Self-pay | Admitting: Internal Medicine

## 2020-06-16 ENCOUNTER — Other Ambulatory Visit: Payer: Self-pay | Admitting: Internal Medicine

## 2020-06-16 ENCOUNTER — Ambulatory Visit: Payer: No Typology Code available for payment source | Admitting: Internal Medicine

## 2020-06-16 ENCOUNTER — Other Ambulatory Visit: Payer: Self-pay

## 2020-06-16 MED FILL — Dulaglutide Soln Auto-injector 0.75 MG/0.5ML: SUBCUTANEOUS | 28 days supply | Qty: 2 | Fill #0 | Status: AC

## 2020-06-16 MED FILL — Losartan Potassium Tab 100 MG: ORAL | 90 days supply | Qty: 90 | Fill #0 | Status: AC

## 2020-06-17 ENCOUNTER — Other Ambulatory Visit: Payer: Self-pay

## 2020-06-17 MED ORDER — LEVOTHYROXINE SODIUM 125 MCG PO TABS
ORAL_TABLET | Freq: Every day | ORAL | 0 refills | Status: DC
  Filled 2020-06-17: qty 180, 90d supply, fill #0

## 2020-06-17 MED ORDER — HYDROCHLOROTHIAZIDE 12.5 MG PO CAPS
ORAL_CAPSULE | Freq: Every day | ORAL | 1 refills | Status: DC
  Filled 2020-06-17: qty 90, 90d supply, fill #0
  Filled 2020-10-06: qty 90, 90d supply, fill #1

## 2020-06-19 ENCOUNTER — Other Ambulatory Visit: Payer: Self-pay

## 2020-06-26 ENCOUNTER — Other Ambulatory Visit: Payer: Self-pay

## 2020-07-04 ENCOUNTER — Other Ambulatory Visit: Payer: Self-pay | Admitting: Internal Medicine

## 2020-07-04 ENCOUNTER — Other Ambulatory Visit: Payer: Self-pay

## 2020-07-04 MED FILL — Insulin Degludec Soln Pen-Injector 100 Unit/ML: SUBCUTANEOUS | 75 days supply | Qty: 15 | Fill #0 | Status: AC

## 2020-07-07 ENCOUNTER — Other Ambulatory Visit: Payer: Self-pay

## 2020-07-08 ENCOUNTER — Other Ambulatory Visit: Payer: Self-pay

## 2020-07-11 MED FILL — Dulaglutide Soln Auto-injector 0.75 MG/0.5ML: SUBCUTANEOUS | 28 days supply | Qty: 2 | Fill #1 | Status: AC

## 2020-07-14 ENCOUNTER — Other Ambulatory Visit: Payer: Self-pay

## 2020-07-25 ENCOUNTER — Other Ambulatory Visit: Payer: Self-pay

## 2020-07-25 MED FILL — Fluticasone Propionate Nasal Susp 50 MCG/ACT: NASAL | 30 days supply | Qty: 16 | Fill #1 | Status: AC

## 2020-08-08 ENCOUNTER — Other Ambulatory Visit (HOSPITAL_COMMUNITY): Payer: Self-pay

## 2020-08-11 MED FILL — Dulaglutide Soln Auto-injector 0.75 MG/0.5ML: SUBCUTANEOUS | 28 days supply | Qty: 2 | Fill #2 | Status: AC

## 2020-08-12 ENCOUNTER — Other Ambulatory Visit: Payer: Self-pay

## 2020-09-01 ENCOUNTER — Other Ambulatory Visit: Payer: Self-pay

## 2020-09-01 MED FILL — Insulin Pen Needle 31 G X 8 MM (1/3" or 5/16"): 90 days supply | Qty: 100 | Fill #1 | Status: AC

## 2020-09-01 MED FILL — Fluticasone Propionate Nasal Susp 50 MCG/ACT: NASAL | 30 days supply | Qty: 16 | Fill #2 | Status: AC

## 2020-09-08 ENCOUNTER — Other Ambulatory Visit: Payer: Self-pay

## 2020-09-08 ENCOUNTER — Other Ambulatory Visit: Payer: Self-pay | Admitting: Internal Medicine

## 2020-09-08 MED ORDER — TRULICITY 0.75 MG/0.5ML ~~LOC~~ SOAJ
SUBCUTANEOUS | 2 refills | Status: DC
  Filled 2020-09-08: qty 2, 28d supply, fill #0
  Filled 2020-10-06: qty 2, 28d supply, fill #1
  Filled 2020-11-03: qty 2, 28d supply, fill #2

## 2020-09-08 MED FILL — Insulin Degludec Soln Pen-Injector 100 Unit/ML: SUBCUTANEOUS | 75 days supply | Qty: 15 | Fill #1 | Status: AC

## 2020-09-15 ENCOUNTER — Other Ambulatory Visit: Payer: Self-pay

## 2020-09-15 ENCOUNTER — Other Ambulatory Visit (INDEPENDENT_AMBULATORY_CARE_PROVIDER_SITE_OTHER): Payer: No Typology Code available for payment source

## 2020-09-15 DIAGNOSIS — E032 Hypothyroidism due to medicaments and other exogenous substances: Secondary | ICD-10-CM

## 2020-09-15 DIAGNOSIS — E669 Obesity, unspecified: Secondary | ICD-10-CM

## 2020-09-15 DIAGNOSIS — K760 Fatty (change of) liver, not elsewhere classified: Secondary | ICD-10-CM | POA: Diagnosis not present

## 2020-09-15 DIAGNOSIS — E1169 Type 2 diabetes mellitus with other specified complication: Secondary | ICD-10-CM

## 2020-09-15 LAB — COMPREHENSIVE METABOLIC PANEL
ALT: 66 U/L — ABNORMAL HIGH (ref 0–53)
AST: 55 U/L — ABNORMAL HIGH (ref 0–37)
Albumin: 4.5 g/dL (ref 3.5–5.2)
Alkaline Phosphatase: 60 U/L (ref 39–117)
BUN: 10 mg/dL (ref 6–23)
CO2: 25 mEq/L (ref 19–32)
Calcium: 9.6 mg/dL (ref 8.4–10.5)
Chloride: 99 mEq/L (ref 96–112)
Creatinine, Ser: 0.97 mg/dL (ref 0.40–1.50)
GFR: 91.32 mL/min (ref >60.00)
Glucose, Bld: 125 mg/dL — ABNORMAL HIGH (ref 70–99)
Potassium: 4.2 mEq/L (ref 3.5–5.1)
Sodium: 136 mEq/L (ref 135–145)
Total Bilirubin: 0.8 mg/dL (ref 0.2–1.2)
Total Protein: 7.3 g/dL (ref 6.0–8.3)

## 2020-09-15 LAB — LIPID PANEL
Cholesterol: 127 mg/dL (ref 0–200)
HDL: 61.2 mg/dL (ref >39.00)
LDL Cholesterol: 48 mg/dL (ref 0–99)
NonHDL: 65.7
Total CHOL/HDL Ratio: 2
Triglycerides: 90 mg/dL (ref 0.0–149.0)
VLDL: 18 mg/dL (ref 0.0–40.0)

## 2020-09-15 LAB — TSH: TSH: 3.11 u[IU]/mL (ref 0.35–5.50)

## 2020-09-15 NOTE — Addendum Note (Signed)
Addended by: Leeanne Rio on: 09/15/2020 02:24 PM   Modules accepted: Orders

## 2020-09-16 LAB — HEMOGLOBIN A1C: Hgb A1c MFr Bld: 6.3 % (ref 4.6–6.5)

## 2020-09-16 NOTE — Addendum Note (Signed)
Addended by: Leeanne Rio on: 09/16/2020 09:15 AM   Modules accepted: Orders

## 2020-09-16 NOTE — Addendum Note (Signed)
Addended by: Crecencio Mc on: 09/16/2020 09:10 AM   Modules accepted: Orders

## 2020-09-17 ENCOUNTER — Encounter: Payer: Self-pay | Admitting: Internal Medicine

## 2020-09-17 ENCOUNTER — Ambulatory Visit (INDEPENDENT_AMBULATORY_CARE_PROVIDER_SITE_OTHER): Payer: No Typology Code available for payment source | Admitting: Internal Medicine

## 2020-09-17 ENCOUNTER — Other Ambulatory Visit: Payer: Self-pay

## 2020-09-17 VITALS — BP 118/76 | HR 91 | Temp 98.5°F | Resp 15 | Ht 74.0 in | Wt 312.8 lb

## 2020-09-17 DIAGNOSIS — E1169 Type 2 diabetes mellitus with other specified complication: Secondary | ICD-10-CM | POA: Diagnosis not present

## 2020-09-17 DIAGNOSIS — E119 Type 2 diabetes mellitus without complications: Secondary | ICD-10-CM | POA: Diagnosis not present

## 2020-09-17 DIAGNOSIS — E669 Obesity, unspecified: Secondary | ICD-10-CM

## 2020-09-17 DIAGNOSIS — R748 Abnormal levels of other serum enzymes: Secondary | ICD-10-CM

## 2020-09-17 DIAGNOSIS — E1159 Type 2 diabetes mellitus with other circulatory complications: Secondary | ICD-10-CM

## 2020-09-17 DIAGNOSIS — I152 Hypertension secondary to endocrine disorders: Secondary | ICD-10-CM

## 2020-09-17 NOTE — Patient Instructions (Signed)
Your diabetes remains under excellent control  And your cholesterol and other labs are also normal. Please continue your current medications. return in 6 months for follow up on diabetes   TIPS FOR SUCCESS:   LESS COASTING ON THE TREADMILL!  YOU SHOULD BE HUFFING AND PUFFING TO GET YOUR CALORIES BURNED   REDUCE APPETITE PRE MEAL WITH WATER/BENEFIBER  STOP EATING WHEN 'no longer hungry'  ,  NOT when full

## 2020-09-17 NOTE — Progress Notes (Signed)
Subjective:  Patient ID: Steven Hoover, male    DOB: 16-Oct-1970  Age: 50 y.o. MRN: [id number]  CC: The primary encounter diagnosis was Elevated liver enzymes. Diagnoses of Diabetes mellitus without complication ([place]) and Obesity, diabetes, and hypertension syndrome ([place]) were also pertinent to this visit.  HPI Steven Hoover presents for follow up on type 2 DM, obesity and hypertension\  This visit occurred during the SARS-CoV-2 public health emergency.  Safety protocols were in place, including screening questions prior to the visit, additional usage of staff PPE, and extensive cleaning of exam room while observing appropriate contact time as indicated for disinfecting solutions.   He feels generally well, but is frustrated at his inability to lose weight.  He is exercising 2-3 times per week with son Steven Hoover at [employer].  Walking slowly on the Treadmill and using 30 minute weight training circuit and checking blood sugars once daily at variable times.  BS have been under 130 fasting and < 150 post prandially.  Denies any recent hypoglyemic events .   Following a carbohydrate modified diet most of the time except for snacks .  Denies numbness, burning and tingling of extremities. Appetite is too good. Snacks on salty snacks.  Using intermittent fasting,  no breakfast. Has restricted eating until daytime pre dinner.  Has Cut out the 2 glasses of wine per night. He reports that the [place] has INCREASED TO $100/ 5 PENS .  [place].   Taking 20 units of Tresiba daily  and [id number] mg Trulicity weekly .         Outpatient Medications Prior to Visit  Medication Sig Dispense Refill   azelastine (ASTELIN) 0.1 % nasal spray use 1 to 2 sprays in each nostril twice daily 30 mL 12   Dulaglutide (TRULICITY) A999333 0000000 SOPN INJECT 0.75 MG INTO SKIN ONCE WEEKLY 2 mL 2   fluticasone (FLONASE) 50 MCG/ACT nasal spray PLACE 2 SPRAYS INTO BOTH NOSTRILS DAILY. 16 g 6   glucose  blood test strip USE AS INSTRUCTED TO CHECK BLOOD SUGARS TWICE A DAY. 100 strip 5   hydrochlorothiazide (MICROZIDE) 12.5 MG capsule TAKE 1 CAPSULE BY MOUTH ONCE DAILY 90 capsule 1   insulin degludec (TRESIBA FLEXTOUCH) 100 UNIT/ML FlexTouch Pen Inject 20 Units into the skin daily. 15 mL 2   Insulin Pen Needle 31G X 8 MM MISC USE WITH PENS AS DIRECTED 100 each 3   levothyroxine (SYNTHROID) 125 MCG tablet TAKE 2 TABLETS BY MOUTH DAILY 180 tablet 0   losartan (COZAAR) 100 MG tablet TAKE 1 TABLET (100 MG TOTAL) BY MOUTH DAILY. 90 tablet 1   Accu-Chek FastClix Lancets MISC USE AS INSTRUCTED 102 each 5   COVID-19 mRNA vaccine, Pfizer, 30 MCG/0.3ML injection USE AS DIRECTED (Patient not taking: Reported on 09/17/2020) .3 mL 0   insulin degludec (TRESIBA) 100 UNIT/ML FlexTouch Pen INJECT 20 UNITS INTO THE SKIN DAILY. 15 mL 3   meloxicam (MOBIC) 15 MG tablet TAKE 1 TABLET (15 MG TOTAL) BY MOUTH DAILY. 30 tablet 0   omeprazole (PRILOSEC) 40 MG capsule TAKE 1 CAPSULE BY MOUTH DAILY. 30 capsule 3   predniSONE (DELTASONE) 10 MG tablet TAKE 6 TABS BY MOUTH ON DAY 1; 5 TABS ON DAY 2; 4 TABS ON DAY 3; 3 TABS ON DAY 4; 2 TABS ON DAY 5; 1 TAB ON DAY 6 THEN STOP 21 tablet 0   No facility-administered medications prior to visit.    Review of Systems;  Patient denies headache, fevers, malaise, unintentional weight loss, skin rash, eye pain, sinus congestion and sinus pain, sore throat, dysphagia,  hemoptysis , cough, dyspnea, wheezing, chest pain, palpitations, orthopnea, edema, abdominal pain, nausea, melena, diarrhea, constipation, flank pain, dysuria, hematuria, urinary  Frequency, nocturia, numbness, tingling, seizures,  Focal weakness, Loss of consciousness,  Tremor, insomnia, depression, anxiety, and suicidal ideation.      Objective:  BP 118/76 (BP Location: Left Arm, Patient Position: Sitting, Cuff Size: Large)   Pulse 91   Temp 98.5 F (36.9 C) (Temporal)   Resp 15   Ht '6\' 2"'$  (1.88 m)   Wt (!) 312 lb  12.8 oz (141.9 kg)   SpO2 97%   BMI 40.16 kg/m   BP Readings from Last 3 Encounters:  09/17/20 118/76  03/14/20 (!) 130/98  12/18/19 122/74    Wt Readings from Last 3 Encounters:  09/17/20 (!) 312 lb 12.8 oz (141.9 kg)  03/14/20 (!) 312 lb 12.8 oz (141.9 kg)  12/18/19 (!) 310 lb 6.4 oz (140.8 kg)    General appearance: alert, cooperative and appears stated age Ears: normal TM's and external ear canals both ears Throat: lips, mucosa, and tongue normal; teeth and gums normal Neck: no adenopathy, no carotid bruit, supple, symmetrical, trachea midline and thyroid not enlarged, symmetric, no tenderness/mass/nodules Back: symmetric, no curvature. ROM normal. No CVA tenderness. Lungs: clear to auscultation bilaterally Heart: regular rate and rhythm, S1, S2 normal, no murmur, click, rub or gallop Abdomen: soft, non-tender; bowel sounds normal; no masses,  no organomegaly Pulses: 2+ and symmetric Skin: Skin color, texture, turgor normal. No rashes or lesions Lymph nodes: Cervical, supraclavicular, and axillary nodes normal.  Lab Results  Component Value Date   HGBA1C 6.3 09/16/2020   HGBA1C 6.3 03/14/2020   HGBA1C 6.2 11/07/2019    Lab Results  Component Value Date   CREATININE 0.97 09/15/2020   CREATININE 0.96 03/14/2020   CREATININE 1.17 11/07/2019    Lab Results  Component Value Date   WBC 7.3 03/11/2015   HGB 16.5 03/11/2015   HCT 49.0 03/11/2015   PLT 274.0 03/11/2015   GLUCOSE 125 (H) 09/15/2020   CHOL 127 09/15/2020   TRIG 90.0 09/15/2020   HDL 61.20 09/15/2020   LDLDIRECT 50.0 11/08/2016   LDLCALC 48 09/15/2020   ALT 66 (H) 09/15/2020   AST 55 (H) 09/15/2020   NA 136 09/15/2020   K 4.2 09/15/2020   CL 99 09/15/2020   CREATININE 0.97 09/15/2020   BUN 10 09/15/2020   CO2 25 09/15/2020   TSH 3.11 09/15/2020   HGBA1C 6.3 09/16/2020   MICROALBUR <0.7 11/07/2019    MR Lumbar Spine Wo Contrast  Result Date: 04/22/2016 CLINICAL DATA:  Low back pain with  sciatica. Left leg pain and numbness. EXAM: MRI LUMBAR SPINE WITHOUT CONTRAST TECHNIQUE: Multiplanar, multisequence MR imaging of the lumbar spine was performed. No intravenous contrast was administered. COMPARISON:  MRI 05/21/2009 FINDINGS: Segmentation:  Normal Alignment:  Normal Vertebrae: Negative for fracture or mass. Mild discogenic changes at L4-5. Conus medullaris: Extends to the L1-2 level and appears normal. Paraspinal and other soft tissues: Negative Disc levels: L1-2:  Negative L2-3:  Negative L3-4: Mild disc and facet degeneration with mild spinal stenosis. There is an element of mild congenital stenosis of the canal. L4-5: Disc degeneration with disc space narrowing. Disc bulging and diffuse endplate spurring similar to the prior study. Improvement in small central disc protrusion since the prior study. Improvement in spinal stenosis. There is mild to  moderate spinal stenosis. Mild facet degeneration. Mild subarticular stenosis left greater than right. L5-S1:  Negative IMPRESSION: Mild to moderate spinal stenosis at L4-5 with improvement since 2011. Mild subarticular stenosis at L4-5 left greater than right Mild spinal stenosis L3-4. Electronically Signed   By: Franchot Gallo M.D.   On: 04/22/2016 08:50    Assessment & Plan:   Problem List Items Addressed This Visit       Unprioritized   RESOLVED: Diabetes mellitus without complication ([place])   Obesity, diabetes, and hypertension syndrome ([place])    Currently  well-controlled on Tresiba 20 units daily and Trulicity but BMI remains > 35 .  Discussed options including changing GLP 1 agonist to Ozempic to further  suppress appetite vs increase intensity and frequency of exercise . He prefers to modify lifestyle. Patient is reminded to schedule an annual eye exam and foot exam is normal today. Patient has no microalbuminuria. Patient is tolerating statin therapy for CAD risk reduction and on ACE/ARB for renal protection and hypertension   Lab  Results  Component Value Date   HGBA1C 6.3 09/16/2020         Other Visit Diagnoses     Elevated liver enzymes    -  Primary   Relevant Orders   CK (Creatine Kinase)   Hepatic function panel     No orders of the defined types were placed in this encounter.   Medications Discontinued During This Encounter  Medication Reason   Accu-Chek FastClix Lancets MISC Error   insulin degludec (TRESIBA) 100 UNIT/ML FlexTouch Pen Error   meloxicam (MOBIC) 15 MG tablet Error   omeprazole (PRILOSEC) 40 MG capsule Error   predniSONE (DELTASONE) 10 MG tablet Error   COVID-19 mRNA vaccine, Pfizer, 30 MCG/0.3ML injection Error    I spent 30 minutes dedicated to the care of this patient on the date of this encounter to include pre-visit review of his medical history,  Face-to-face time with the patient , and post visit ordering of testing and therapeutics.  Follow-up: No follow-ups on file.   Crecencio Mc, MD

## 2020-09-19 ENCOUNTER — Telehealth: Payer: Self-pay | Admitting: Internal Medicine

## 2020-09-19 ENCOUNTER — Other Ambulatory Visit: Payer: Self-pay

## 2020-09-19 DIAGNOSIS — E119 Type 2 diabetes mellitus without complications: Secondary | ICD-10-CM | POA: Insufficient documentation

## 2020-09-19 DIAGNOSIS — E1159 Type 2 diabetes mellitus with other circulatory complications: Secondary | ICD-10-CM | POA: Insufficient documentation

## 2020-09-19 NOTE — Assessment & Plan Note (Addendum)
Currently  well-controlled on Tresiba 20 units daily and Trulicity but BMI remains > 35 .  Discussed options including changing GLP 1 agonist to Ozempic to further  suppress appetite vs increase intensity and frequency of exercise . He prefers to modify lifestyle. Patient is reminded to schedule an annual eye exam and foot exam is normal today. Patient has no microalbuminuria. Patient is tolerating statin therapy for CAD risk reduction and on ACE/ARB for renal protection and hypertension   Lab Results  Component Value Date   HGBA1C 6.3 09/16/2020

## 2020-09-21 ENCOUNTER — Other Ambulatory Visit: Payer: Self-pay | Admitting: Internal Medicine

## 2020-09-22 ENCOUNTER — Other Ambulatory Visit: Payer: Self-pay

## 2020-09-22 MED ORDER — LEVOTHYROXINE SODIUM 125 MCG PO TABS
ORAL_TABLET | Freq: Every day | ORAL | 0 refills | Status: DC
  Filled 2020-09-22: qty 180, 90d supply, fill #0

## 2020-09-22 MED ORDER — LOSARTAN POTASSIUM 100 MG PO TABS
ORAL_TABLET | Freq: Every day | ORAL | 1 refills | Status: DC
  Filled 2020-09-22: qty 90, 90d supply, fill #0
  Filled 2020-12-29: qty 90, 90d supply, fill #1

## 2020-10-01 ENCOUNTER — Other Ambulatory Visit: Payer: No Typology Code available for payment source

## 2020-10-07 ENCOUNTER — Other Ambulatory Visit: Payer: Self-pay

## 2020-10-08 ENCOUNTER — Other Ambulatory Visit: Payer: Self-pay

## 2020-10-08 ENCOUNTER — Other Ambulatory Visit (INDEPENDENT_AMBULATORY_CARE_PROVIDER_SITE_OTHER): Payer: No Typology Code available for payment source

## 2020-10-08 DIAGNOSIS — R748 Abnormal levels of other serum enzymes: Secondary | ICD-10-CM | POA: Diagnosis not present

## 2020-10-08 DIAGNOSIS — E032 Hypothyroidism due to medicaments and other exogenous substances: Secondary | ICD-10-CM

## 2020-10-08 LAB — CK: Total CK: 145 U/L (ref 7–232)

## 2020-10-08 LAB — HEPATIC FUNCTION PANEL
ALT: 58 U/L — ABNORMAL HIGH (ref 0–53)
AST: 42 U/L — ABNORMAL HIGH (ref 0–37)
Albumin: 4.5 g/dL (ref 3.5–5.2)
Alkaline Phosphatase: 59 U/L (ref 39–117)
Bilirubin, Direct: 0.2 mg/dL (ref 0.0–0.3)
Total Bilirubin: 0.9 mg/dL (ref 0.2–1.2)
Total Protein: 7.5 g/dL (ref 6.0–8.3)

## 2020-10-09 ENCOUNTER — Other Ambulatory Visit: Payer: Self-pay | Admitting: Internal Medicine

## 2020-10-09 ENCOUNTER — Other Ambulatory Visit: Payer: Self-pay

## 2020-10-09 DIAGNOSIS — E032 Hypothyroidism due to medicaments and other exogenous substances: Secondary | ICD-10-CM

## 2020-10-09 DIAGNOSIS — K760 Fatty (change of) liver, not elsewhere classified: Secondary | ICD-10-CM

## 2020-10-09 MED ORDER — LEVOTHYROXINE SODIUM 125 MCG PO TABS
125.0000 ug | ORAL_TABLET | Freq: Every day | ORAL | 3 refills | Status: DC
  Filled 2020-10-09 – 2021-03-25 (×2): qty 90, 90d supply, fill #0
  Filled 2021-06-29: qty 30, 30d supply, fill #1
  Filled 2021-07-26: qty 30, 30d supply, fill #2
  Filled 2021-09-03: qty 30, 30d supply, fill #3
  Filled 2021-09-28: qty 30, 30d supply, fill #4

## 2020-10-09 MED ORDER — LEVOTHYROXINE SODIUM 150 MCG PO TABS
150.0000 ug | ORAL_TABLET | Freq: Every day | ORAL | 3 refills | Status: DC
  Filled 2020-10-09: qty 90, 90d supply, fill #0
  Filled 2020-12-31: qty 90, 90d supply, fill #1
  Filled 2021-03-25: qty 90, 90d supply, fill #2
  Filled 2021-06-29: qty 30, 30d supply, fill #3
  Filled 2021-07-26: qty 30, 30d supply, fill #4
  Filled 2021-09-03: qty 30, 30d supply, fill #5

## 2020-10-09 NOTE — Assessment & Plan Note (Signed)
Increase total daily dose to 275 mcg for  Goal TSH < 1.0

## 2020-10-10 ENCOUNTER — Other Ambulatory Visit: Payer: Self-pay

## 2020-10-29 ENCOUNTER — Other Ambulatory Visit: Payer: Self-pay

## 2020-10-29 MED FILL — Fluticasone Propionate Nasal Susp 50 MCG/ACT: NASAL | 30 days supply | Qty: 16 | Fill #3 | Status: AC

## 2020-10-29 MED FILL — Fluticasone Propionate Nasal Susp 50 MCG/ACT: NASAL | 30 days supply | Qty: 16 | Fill #3 | Status: CN

## 2020-11-04 ENCOUNTER — Other Ambulatory Visit: Payer: Self-pay

## 2020-11-14 ENCOUNTER — Other Ambulatory Visit: Payer: Self-pay

## 2020-11-14 MED FILL — Insulin Degludec Soln Pen-Injector 100 Unit/ML: SUBCUTANEOUS | 75 days supply | Qty: 15 | Fill #2 | Status: AC

## 2020-12-01 ENCOUNTER — Other Ambulatory Visit: Payer: Self-pay | Admitting: Internal Medicine

## 2020-12-02 ENCOUNTER — Other Ambulatory Visit: Payer: Self-pay

## 2020-12-02 MED ORDER — TRULICITY 0.75 MG/0.5ML ~~LOC~~ SOAJ
SUBCUTANEOUS | 2 refills | Status: DC
  Filled 2020-12-02: qty 2, 28d supply, fill #0
  Filled 2020-12-29: qty 2, 28d supply, fill #1
  Filled 2021-01-26: qty 2, 28d supply, fill #2

## 2020-12-18 ENCOUNTER — Other Ambulatory Visit: Payer: Self-pay

## 2020-12-18 MED FILL — Insulin Pen Needle 31 G X 8 MM (1/3" or 5/16"): 90 days supply | Qty: 100 | Fill #2 | Status: AC

## 2020-12-18 MED FILL — Fluticasone Propionate Nasal Susp 50 MCG/ACT: NASAL | 30 days supply | Qty: 16 | Fill #4 | Status: AC

## 2020-12-29 ENCOUNTER — Other Ambulatory Visit: Payer: Self-pay

## 2020-12-30 ENCOUNTER — Other Ambulatory Visit: Payer: Self-pay

## 2020-12-31 ENCOUNTER — Other Ambulatory Visit: Payer: Self-pay

## 2021-01-12 ENCOUNTER — Other Ambulatory Visit: Payer: Self-pay | Admitting: Internal Medicine

## 2021-01-13 ENCOUNTER — Other Ambulatory Visit: Payer: Self-pay | Admitting: Internal Medicine

## 2021-01-13 ENCOUNTER — Other Ambulatory Visit: Payer: Self-pay

## 2021-01-13 MED ORDER — TRESIBA FLEXTOUCH 100 UNIT/ML ~~LOC~~ SOPN
20.0000 [IU] | PEN_INJECTOR | Freq: Every day | SUBCUTANEOUS | 2 refills | Status: DC
Start: 2021-01-13 — End: 2021-10-01
  Filled 2021-01-13: qty 15, 75d supply, fill #0
  Filled 2021-04-19: qty 15, 75d supply, fill #1
  Filled 2021-07-05: qty 15, 75d supply, fill #2

## 2021-01-13 MED ORDER — HYDROCHLOROTHIAZIDE 12.5 MG PO CAPS
ORAL_CAPSULE | Freq: Every day | ORAL | 1 refills | Status: DC
  Filled 2021-01-13: qty 90, 90d supply, fill #0
  Filled 2021-04-19: qty 90, 90d supply, fill #1

## 2021-01-13 MED ORDER — FLUTICASONE PROPIONATE 50 MCG/ACT NA SUSP
2.0000 | Freq: Every day | NASAL | 6 refills | Status: DC
Start: 2021-01-13 — End: 2022-03-13
  Filled 2021-01-13: qty 16, 30d supply, fill #0
  Filled 2021-10-26: qty 16, 30d supply, fill #1
  Filled 2021-12-10: qty 16, 30d supply, fill #2

## 2021-01-23 ENCOUNTER — Other Ambulatory Visit: Payer: Self-pay

## 2021-01-26 ENCOUNTER — Other Ambulatory Visit: Payer: Self-pay

## 2021-01-27 ENCOUNTER — Other Ambulatory Visit: Payer: Self-pay

## 2021-03-02 ENCOUNTER — Other Ambulatory Visit: Payer: Self-pay | Admitting: Internal Medicine

## 2021-03-03 ENCOUNTER — Other Ambulatory Visit: Payer: Self-pay

## 2021-03-03 ENCOUNTER — Other Ambulatory Visit: Payer: Self-pay | Admitting: Internal Medicine

## 2021-03-03 MED FILL — Dulaglutide Soln Auto-injector 0.75 MG/0.5ML: SUBCUTANEOUS | 28 days supply | Qty: 2 | Fill #0 | Status: AC

## 2021-03-20 ENCOUNTER — Ambulatory Visit (INDEPENDENT_AMBULATORY_CARE_PROVIDER_SITE_OTHER): Payer: No Typology Code available for payment source | Admitting: Internal Medicine

## 2021-03-20 ENCOUNTER — Other Ambulatory Visit: Payer: Self-pay

## 2021-03-20 ENCOUNTER — Encounter: Payer: Self-pay | Admitting: Internal Medicine

## 2021-03-20 VITALS — BP 118/80 | HR 91 | Temp 98.0°F | Ht 74.0 in | Wt 320.0 lb

## 2021-03-20 DIAGNOSIS — E1169 Type 2 diabetes mellitus with other specified complication: Secondary | ICD-10-CM | POA: Diagnosis not present

## 2021-03-20 DIAGNOSIS — I10 Essential (primary) hypertension: Secondary | ICD-10-CM

## 2021-03-20 DIAGNOSIS — R1011 Right upper quadrant pain: Secondary | ICD-10-CM

## 2021-03-20 DIAGNOSIS — E785 Hyperlipidemia, unspecified: Secondary | ICD-10-CM

## 2021-03-20 DIAGNOSIS — K5900 Constipation, unspecified: Secondary | ICD-10-CM

## 2021-03-20 DIAGNOSIS — E669 Obesity, unspecified: Secondary | ICD-10-CM

## 2021-03-20 DIAGNOSIS — E032 Hypothyroidism due to medicaments and other exogenous substances: Secondary | ICD-10-CM

## 2021-03-20 DIAGNOSIS — Z6841 Body Mass Index (BMI) 40.0 and over, adult: Secondary | ICD-10-CM

## 2021-03-20 LAB — LIPID PANEL
Cholesterol: 116 mg/dL (ref 0–200)
HDL: 60.9 mg/dL (ref >39.00)
LDL Cholesterol: 38 mg/dL (ref 0–99)
NonHDL: 55.03
Total CHOL/HDL Ratio: 2
Triglycerides: 84 mg/dL (ref 0.0–149.0)
VLDL: 16.8 mg/dL (ref 0.0–40.0)

## 2021-03-20 LAB — MICROALBUMIN / CREATININE URINE RATIO
Creatinine,U: 20 mg/dL
Microalb Creat Ratio: 3.5 mg/g (ref 0.0–30.0)
Microalb, Ur: <0.7 mg/dL (ref 0.0–1.9)

## 2021-03-20 LAB — COMPREHENSIVE METABOLIC PANEL
ALT: 59 U/L — ABNORMAL HIGH (ref 0–53)
AST: 38 U/L — ABNORMAL HIGH (ref 0–37)
Albumin: 4.6 g/dL (ref 3.5–5.2)
Alkaline Phosphatase: 69 U/L (ref 39–117)
BUN: 11 mg/dL (ref 6–23)
CO2: 28 mEq/L (ref 19–32)
Calcium: 10.1 mg/dL (ref 8.4–10.5)
Chloride: 100 mEq/L (ref 96–112)
Creatinine, Ser: 0.77 mg/dL (ref 0.40–1.50)
GFR: 104.36 mL/min (ref >60.00)
Glucose, Bld: 140 mg/dL — ABNORMAL HIGH (ref 70–99)
Potassium: 3.9 mEq/L (ref 3.5–5.1)
Sodium: 134 mEq/L — ABNORMAL LOW (ref 135–145)
Total Bilirubin: 1.1 mg/dL (ref 0.2–1.2)
Total Protein: 7.2 g/dL (ref 6.0–8.3)

## 2021-03-20 LAB — TSH: TSH: 1.04 u[IU]/mL (ref 0.35–5.50)

## 2021-03-20 LAB — HEMOGLOBIN A1C: Hgb A1c MFr Bld: 7.5 % — ABNORMAL HIGH (ref 4.6–6.5)

## 2021-03-20 NOTE — Assessment & Plan Note (Signed)
Recommending fiber and mg citrate.  suspend trulicity as a trial

## 2021-03-20 NOTE — Progress Notes (Signed)
Subjective:  Patient ID: Steven Hoover, male    DOB: Aug 10, 1970  Age: 51 y.o. MRN: [id number]  CC: The primary encounter diagnosis was Essential hypertension, benign. Diagnoses of Hypothyroidism, iatrogenic, Type 2 diabetes mellitus with obesity ([place]), Hyperlipidemia, unspecified hyperlipidemia type, Postprandial RUQ pain, Constipation, unspecified constipation type, RUQ pain, and Morbid obesity with BMI of 40.0-44.9, adult ([place]) were also pertinent to this visit.   This visit occurred during the SARS-CoV-2 public health emergency.  Safety protocols were in place, including screening questions prior to the visit, additional usage of staff PPE, and extensive cleaning of exam room while observing appropriate contact time as indicated for disinfecting solutions.    HPI Steven Hoover presents for  6 month follow up on type 2 DM morbid obesity Chief Complaint  Patient presents with   Follow-up    6 month follow up on diabetes. Hypothyroidism, and hypertension    1) T2DM:  not checking sugars.  Taking trulicity and tresiba. Eating fast food.  Weight gain noted. eye exam postponed til may  2) Morbid obesity: has gained  8lbs since last visit in august . More fast food lately due to work and parents In [place]  2)   recurrent episodes (2 I n the last 3 weeks) of constipation, after several days of no stool  develops increased bloating and gas that builds up and causes chest pain. None in the past week.  Occurring at rest.  Not exercising regularly but does not occur during exercise . Salads give him nausea 1 hour after  consuming , followed by diarrhea with undigested lettuce in stool.  Has tried using dulcolax, works within 3 or 4 hours.     Outpatient Medications Prior to Visit  Medication Sig Dispense Refill   Dulaglutide (TRULICITY) 1.19 JY/7.8GN SOPN INJECT 0.75 MG INTO SKIN ONCE WEEKLY 2 mL 2   fluticasone (FLONASE) 50 MCG/ACT nasal spray PLACE 2 SPRAYS INTO BOTH NOSTRILS  DAILY. 16 g 6   hydrochlorothiazide (MICROZIDE) 12.5 MG capsule TAKE 1 CAPSULE BY MOUTH ONCE DAILY 90 capsule 1   insulin degludec (TRESIBA FLEXTOUCH) 100 UNIT/ML FlexTouch Pen Inject 20 Units into the skin daily. 15 mL 2   Insulin Pen Needle 31G X 8 MM MISC USE WITH PENS AS DIRECTED 100 each 3   levothyroxine (SYNTHROID) 125 MCG tablet Take 1 tablet (125 mcg total) by mouth daily. PLUS 150 MCG DAILY TOTAL DOSE 275 MCG DAILY 90 tablet 3   levothyroxine (SYNTHROID) 150 MCG tablet Take 1 tablet (150 mcg total) by mouth daily. Plus 125 mcg daily total daily dose 275 mcg 90 tablet 3   losartan (COZAAR) 100 MG tablet TAKE 1 TABLET (100 MG TOTAL) BY MOUTH DAILY. 90 tablet 1   azelastine (ASTELIN) 0.1 % nasal spray use 1 to 2 sprays in each nostril twice daily 30 mL 12   No facility-administered medications prior to visit.    Review of Systems;  Patient denies headache, fevers, malaise, unintentional weight loss, skin rash, eye pain, sinus congestion and sinus pain, sore throat, dysphagia,  hemoptysis , cough, dyspnea, wheezing, chest pain, palpitations, orthopnea, edema, abdominal pain, nausea, melena, diarrhea, constipation, flank pain, dysuria, hematuria, urinary  Frequency, nocturia, numbness, tingling, seizures,  Focal weakness, Loss of consciousness,  Tremor, insomnia, depression, anxiety, and suicidal ideation.      Objective:  BP 118/80 (BP Location: Left Arm, Patient Position: Sitting, Cuff Size: Large)    Pulse 91    Temp 98 F (36.7 C) (Oral)  Ht 6' 2"  (1.88 m)    Wt (!) 320 lb (145.2 kg)    SpO2 98%    BMI 41.09 kg/m   BP Readings from Last 3 Encounters:  03/20/21 118/80  09/17/20 118/76  03/14/20 (!) 130/98    Wt Readings from Last 3 Encounters:  03/20/21 (!) 320 lb (145.2 kg)  09/17/20 (!) 312 lb 12.8 oz (141.9 kg)  03/14/20 (!) 312 lb 12.8 oz (141.9 kg)    General appearance: alert, cooperative and appears stated age Ears: normal TM's and external ear canals both  ears Throat: lips, mucosa, and tongue normal; teeth and gums normal Neck: no adenopathy, no carotid bruit, supple, symmetrical, trachea midline and thyroid not enlarged, symmetric, no tenderness/mass/nodules Back: symmetric, no curvature. ROM normal. No CVA tenderness. Lungs: clear to auscultation bilaterally Heart: regular rate and rhythm, S1, S2 normal, no murmur, click, rub or gallop Abdomen: soft, non-tender; bowel sounds normal; no masses,  no organomegaly Pulses: 2+ and symmetric Skin: Skin color, texture, turgor normal. No rashes or lesions Lymph nodes: Cervical, supraclavicular, and axillary nodes normal.  Lab Results  Component Value Date   HGBA1C 6.3 09/16/2020   HGBA1C 6.3 03/14/2020   HGBA1C 6.2 11/07/2019    Lab Results  Component Value Date   CREATININE 0.97 09/15/2020   CREATININE 0.96 03/14/2020   CREATININE 1.17 11/07/2019    Lab Results  Component Value Date   WBC 7.3 03/11/2015   HGB 16.5 03/11/2015   HCT 49.0 03/11/2015   PLT 274.0 03/11/2015   GLUCOSE 125 (H) 09/15/2020   CHOL 127 09/15/2020   TRIG 90.0 09/15/2020   HDL 61.20 09/15/2020   LDLDIRECT 50.0 11/08/2016   LDLCALC 48 09/15/2020   ALT 58 (H) 10/08/2020   AST 42 (H) 10/08/2020   NA 136 09/15/2020   K 4.2 09/15/2020   CL 99 09/15/2020   CREATININE 0.97 09/15/2020   BUN 10 09/15/2020   CO2 25 09/15/2020   TSH 3.11 09/15/2020   HGBA1C 6.3 09/16/2020   MICROALBUR <0.7 11/07/2019    MR Lumbar Spine Wo Contrast  Result Date: 04/22/2016 CLINICAL DATA:  Low back pain with sciatica. Left leg pain and numbness. EXAM: MRI LUMBAR SPINE WITHOUT CONTRAST TECHNIQUE: Multiplanar, multisequence MR imaging of the lumbar spine was performed. No intravenous contrast was administered. COMPARISON:  MRI 05/21/2009 FINDINGS: Segmentation:  Normal Alignment:  Normal Vertebrae: Negative for fracture or mass. Mild discogenic changes at L4-5. Conus medullaris: Extends to the L1-2 level and appears normal.  Paraspinal and other soft tissues: Negative Disc levels: L1-2:  Negative L2-3:  Negative L3-4: Mild disc and facet degeneration with mild spinal stenosis. There is an element of mild congenital stenosis of the canal. L4-5: Disc degeneration with disc space narrowing. Disc bulging and diffuse endplate spurring similar to the prior study. Improvement in small central disc protrusion since the prior study. Improvement in spinal stenosis. There is mild to moderate spinal stenosis. Mild facet degeneration. Mild subarticular stenosis left greater than right. L5-S1:  Negative IMPRESSION: Mild to moderate spinal stenosis at L4-5 with improvement since 2011. Mild subarticular stenosis at L4-5 left greater than right Mild spinal stenosis L3-4. Electronically Signed   By: Franchot Gallo M.D.   On: 04/22/2016 08:50    Assessment & Plan:   Problem List Items Addressed This Visit     Constipation    Recommending fiber and mg citrate.  suspend trulicity as a trial      Essential hypertension, benign - Primary  Relevant Orders   Comp Met (CMET)   Urine Microalbumin w/creat. ratio   Hypothyroidism, iatrogenic   Relevant Orders   TSH   Morbid obesity with BMI of 40.0-44.9, adult ([facility])    Weight gain due to lack of exercise and increased fast food      RUQ pain    Gas pains vs biliary colic.  [place] ordered       Type 2 diabetes mellitus with obesity ([facility])    Previous trial of metformin were not tolerated due to persistent diarrhea .  Taking trulicity and tresiba  .  ozempic discussed as alternative to trulicity,  But not with current constipation issues      Relevant Orders   HgB A1c   Other Visit Diagnoses     Hyperlipidemia, unspecified hyperlipidemia type       Relevant Orders   Lipid Profile   Postprandial RUQ pain       Relevant Orders   US Abdomen Limited RUQ (LIVER/GB)       I spent 30 minutes dedicated to the care of this patient on the date of this encounter to include pre-visit  review of patient's medical history,  most recent imaging studies, Face-to-face time with the patient , and post visit ordering of testing and therapeutics.    Follow-up: No follow-ups on file.   Crecencio Mc, MD

## 2021-03-20 NOTE — Assessment & Plan Note (Signed)
Gas pains vs biliary colic.  [place] ordered

## 2021-03-20 NOTE — Assessment & Plan Note (Addendum)
Weight gain due to lack of exercise and increased fast food

## 2021-03-20 NOTE — Patient Instructions (Addendum)
I agree with a trial of fiber supplements  but don't start with more than one dose daily , and don't take with other meds. (They won't get absorbed)  You can also add Magnesium citrate 250  mg capsule daily .  Safe to add   Continue 60 ounces water daily   You can try suspending the Trulicity to see if it improves the constipation  Checking thyroid today    Plain abdominal film ordered;  if you have time to go, (doesn't have to be today) and abd ultrasound ordered to evaluate gallbladder

## 2021-03-20 NOTE — Assessment & Plan Note (Addendum)
Previous trial of metformin were not tolerated due to persistent diarrhea .  Taking trulicity and tresiba  .  ozempic discussed as alternative to trulicity,  But not with current constipation issues

## 2021-03-25 ENCOUNTER — Other Ambulatory Visit: Payer: Self-pay

## 2021-03-25 MED FILL — Dulaglutide Soln Auto-injector 0.75 MG/0.5ML: SUBCUTANEOUS | 28 days supply | Qty: 2 | Fill #1 | Status: AC

## 2021-03-26 ENCOUNTER — Other Ambulatory Visit: Payer: Self-pay

## 2021-03-27 ENCOUNTER — Ambulatory Visit: Payer: No Typology Code available for payment source

## 2021-04-02 ENCOUNTER — Other Ambulatory Visit: Payer: Self-pay

## 2021-04-02 ENCOUNTER — Other Ambulatory Visit: Payer: Self-pay | Admitting: Internal Medicine

## 2021-04-03 ENCOUNTER — Other Ambulatory Visit: Payer: Self-pay | Admitting: Internal Medicine

## 2021-04-03 ENCOUNTER — Other Ambulatory Visit: Payer: Self-pay

## 2021-04-03 MED FILL — Losartan Potassium Tab 100 MG: ORAL | 90 days supply | Qty: 90 | Fill #0 | Status: AC

## 2021-04-06 ENCOUNTER — Other Ambulatory Visit: Payer: Self-pay

## 2021-04-17 ENCOUNTER — Other Ambulatory Visit: Payer: No Typology Code available for payment source

## 2021-04-19 ENCOUNTER — Other Ambulatory Visit: Payer: Self-pay | Admitting: Internal Medicine

## 2021-04-19 MED FILL — Dulaglutide Soln Auto-injector 0.75 MG/0.5ML: SUBCUTANEOUS | 28 days supply | Qty: 2 | Fill #2 | Status: AC

## 2021-04-20 ENCOUNTER — Other Ambulatory Visit: Payer: Self-pay

## 2021-04-20 MED ORDER — UNIFINE PENTIPS 31G X 8 MM MISC
3 refills | Status: AC
  Filled 2021-04-20: qty 100, 90d supply, fill #0
  Filled 2021-08-14: qty 100, 90d supply, fill #1
  Filled 2021-11-11: qty 100, 90d supply, fill #2
  Filled 2022-02-11 (×2): qty 100, 90d supply, fill #3

## 2021-05-04 ENCOUNTER — Other Ambulatory Visit: Payer: Self-pay

## 2021-06-11 ENCOUNTER — Other Ambulatory Visit: Payer: Self-pay

## 2021-06-11 MED ORDER — CHLORHEXIDINE GLUCONATE 0.12 % MT SOLN
OROMUCOSAL | 12 refills | Status: DC
Start: 2021-06-11 — End: 2021-09-07
  Filled 2021-06-11: qty 473, 15d supply, fill #0
  Filled 2021-07-27: qty 473, 15d supply, fill #1

## 2021-06-11 MED ORDER — NAPROXEN 250 MG PO TABS: ORAL_TABLET | ORAL | 0 refills | Status: DC

## 2021-06-11 MED ORDER — AMOXICILLIN 500 MG PO CAPS
ORAL_CAPSULE | ORAL | 0 refills | Status: DC
  Filled 2021-06-11: qty 40, 10d supply, fill #0

## 2021-06-15 ENCOUNTER — Other Ambulatory Visit (HOSPITAL_COMMUNITY): Payer: Self-pay

## 2021-06-29 ENCOUNTER — Other Ambulatory Visit: Payer: Self-pay | Admitting: Internal Medicine

## 2021-06-29 MED FILL — Losartan Potassium Tab 100 MG: ORAL | 90 days supply | Qty: 90 | Fill #1 | Status: AC

## 2021-06-30 ENCOUNTER — Other Ambulatory Visit: Payer: Self-pay

## 2021-06-30 MED ORDER — HYDROCHLOROTHIAZIDE 12.5 MG PO CAPS
ORAL_CAPSULE | Freq: Every day | ORAL | 1 refills | Status: DC
  Filled 2021-06-30: qty 90, 90d supply, fill #0

## 2021-07-01 ENCOUNTER — Other Ambulatory Visit: Payer: Self-pay

## 2021-07-03 ENCOUNTER — Other Ambulatory Visit: Payer: Self-pay

## 2021-07-05 ENCOUNTER — Other Ambulatory Visit: Payer: Self-pay

## 2021-07-06 ENCOUNTER — Other Ambulatory Visit: Payer: Self-pay

## 2021-07-13 LAB — HM DIABETES EYE EXAM

## 2021-07-21 IMAGING — DX DG FOOT COMPLETE 3+V*L*
3 series · 3 of 3 positions shown · non-contrast
Comparison: None.

CLINICAL DATA: Medial left foot and ankle pain for 1 month. No
known injury.

EXAM:
LEFT FOOT - COMPLETE 3+ VIEW; LEFT ANKLE COMPLETE - 3+ VIEW

[foot ap]
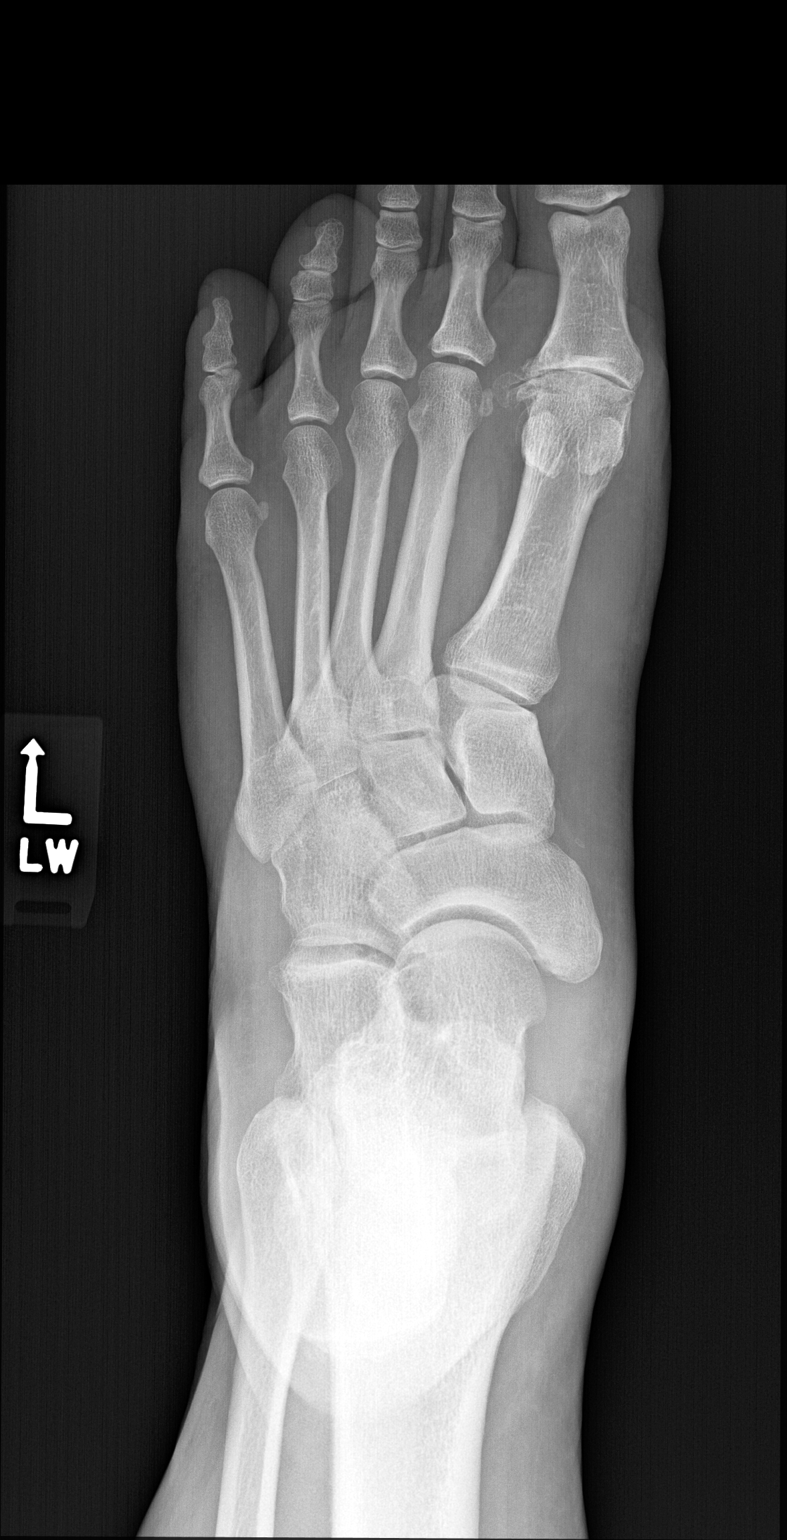

[foot obl (oblique)]
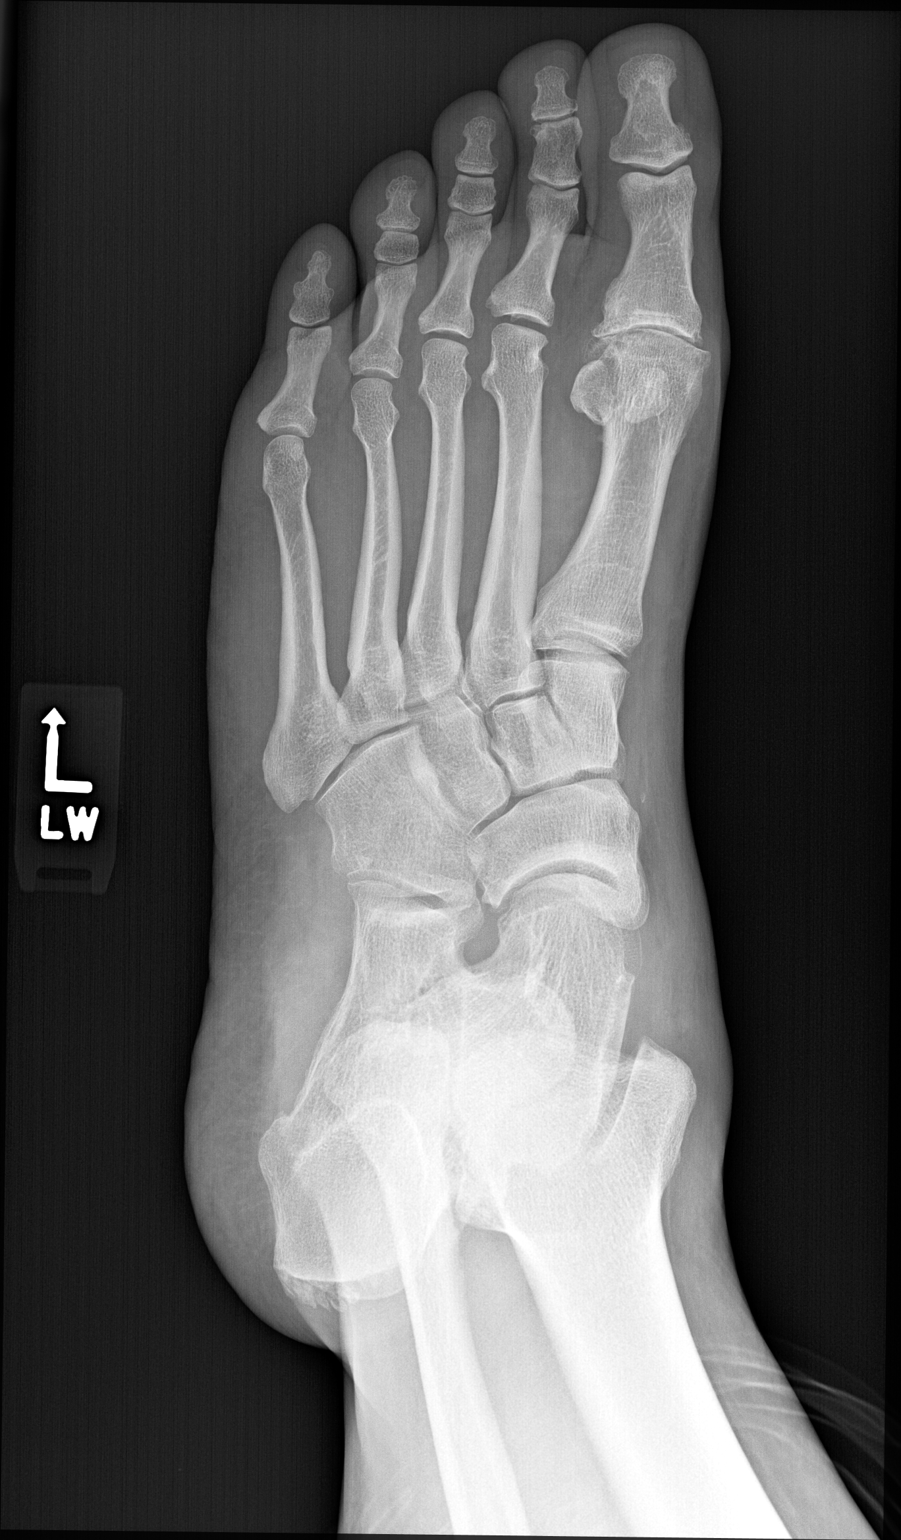

[foot lat]
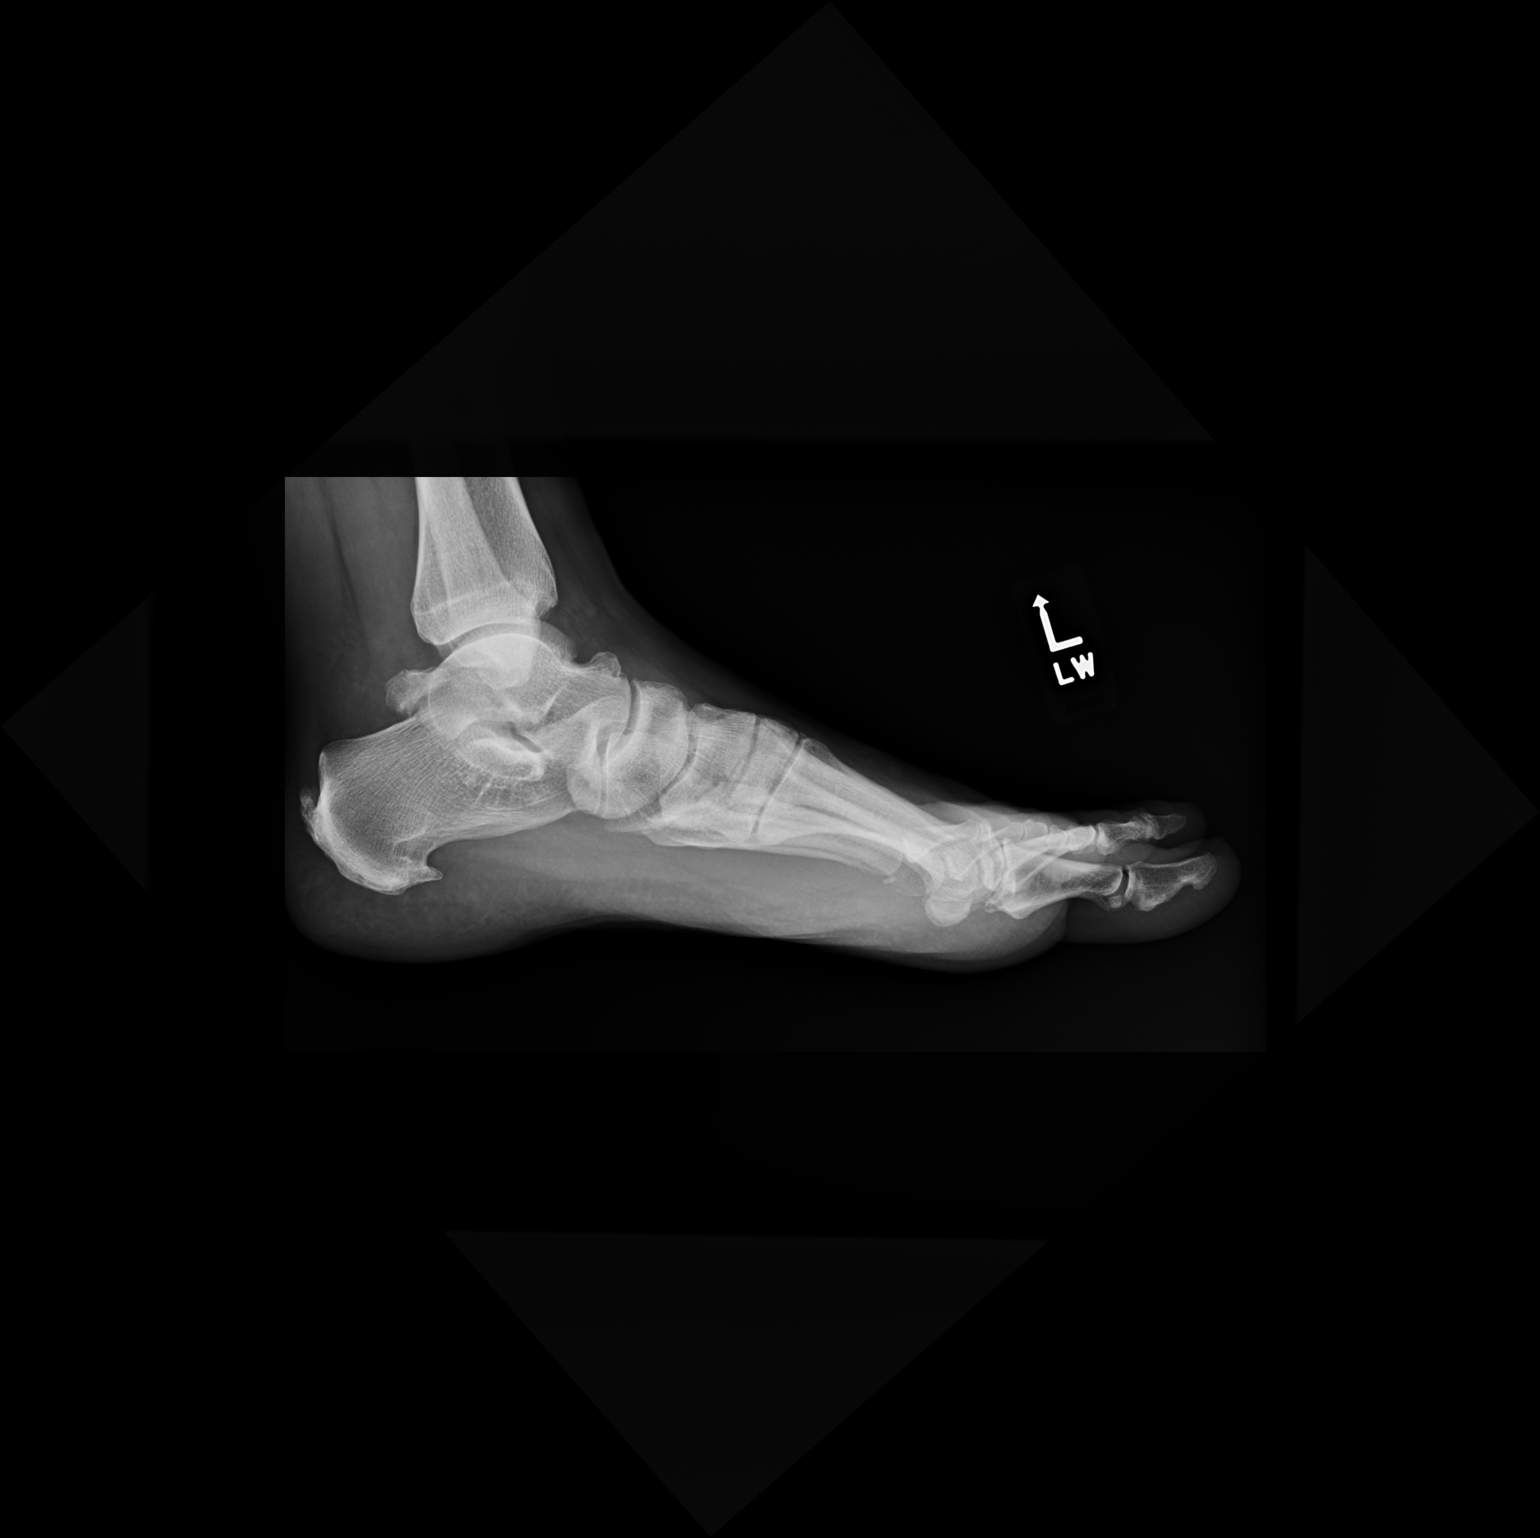

[3 of 3 positions shown; findings below may reference images not displayed]

FINDINGS: There is no acute bony or joint abnormality. The patient has
markedly advanced for age first MTP osteoarthritis with bone-on-bone
joint space narrowing and bulky osteophytosis. Large os trigonum is
seen. There is a small exostosis off the dorsal neck of the talus.
The anterior process of the calcaneus is prominent and nearly abuts
the navicular. Dorsal and plantar calcaneal spurs are identified.
Soft tissues are unremarkable.
IMPRESSION: No acute abnormality.

Markedly advanced for age first MTP osteoarthritis.

Findings suggestive of calcaneal navicular coalition.

Large os trigonum.

Calcaneal spurring.

## 2021-07-26 ENCOUNTER — Other Ambulatory Visit: Payer: Self-pay

## 2021-07-27 ENCOUNTER — Other Ambulatory Visit: Payer: Self-pay

## 2021-07-28 ENCOUNTER — Other Ambulatory Visit: Payer: Self-pay

## 2021-08-06 ENCOUNTER — Telehealth: Payer: Self-pay | Admitting: Internal Medicine

## 2021-08-06 NOTE — Telephone Encounter (Signed)
Pt called in stating his BP has been high 140/91. Pt stated he feels congested like his head is full. Pt stated his BP has been running high for two weeks and the past three days it has been in the 140s. Pt stated he has been eating right and exercising Sent to access nurse

## 2021-08-06 NOTE — Telephone Encounter (Signed)
Pt called back and stated access nurse told him that he needed to be seen in the next couple of weeks

## 2021-08-06 NOTE — Telephone Encounter (Signed)
Noted. Will evaluate in office  

## 2021-08-07 ENCOUNTER — Encounter: Payer: Self-pay | Admitting: Nurse Practitioner

## 2021-08-07 ENCOUNTER — Other Ambulatory Visit: Payer: Self-pay

## 2021-08-07 ENCOUNTER — Ambulatory Visit (INDEPENDENT_AMBULATORY_CARE_PROVIDER_SITE_OTHER): Payer: No Typology Code available for payment source | Admitting: Nurse Practitioner

## 2021-08-07 VITALS — BP 130/90 | HR 89 | Temp 98.3°F | Resp 16 | Ht 74.0 in | Wt 302.6 lb

## 2021-08-07 DIAGNOSIS — R0789 Other chest pain: Secondary | ICD-10-CM | POA: Insufficient documentation

## 2021-08-07 DIAGNOSIS — I10 Essential (primary) hypertension: Secondary | ICD-10-CM | POA: Diagnosis not present

## 2021-08-07 MED ORDER — HYDROCHLOROTHIAZIDE 25 MG PO TABS
25.0000 mg | ORAL_TABLET | Freq: Every day | ORAL | 0 refills | Status: DC
  Filled 2021-08-07: qty 90, 90d supply, fill #0

## 2021-08-07 NOTE — Assessment & Plan Note (Signed)
Patient is getting elevated readings at home and to elevated readings in office today.  Patient's been working on weight loss and has lost cerumen weight since his last office visit.  Did have conversation with patient to continue taking losartan 100 mg and we will increase his HCTZ to 25 mg.  He can take 2 of his 12.5 at home until finished and I sent a new prescription for HCTZ 25 mg.  He does have a follow-up with his primary care doctor to go next month.  Encouraged him to keep that appointment at which she can evaluate treatment changes

## 2021-08-07 NOTE — Patient Instructions (Signed)
Nice to see you today Continue checking your blood pressure daily or if you feel off EKG looked good in office. Keep the appointment with Dr. Derrel Nip in August.   We will increase your HCTZ (hydrochlorothiazide) from 12.5 mg to 25 mg. You can take two of the pills you got until you finish and then I will send in an updated prescription to your pharmacy

## 2021-08-07 NOTE — Progress Notes (Signed)
Acute Office Visit  Subjective:     Patient ID: Steven Hoover, male    DOB: 12-27-1970, 51 y.o.   MRN: [id number]  Chief Complaint  Patient presents with   Hypertension    Over the last week bp has been running 140/90 range      Patient is in today for Blood pressure issues  States that when he turned 50 he has wanted to take better care of his self. He has lost 20 pounds since feb. He does have a stressful job and has been stressed recently  States that he bought a blood pressure cuff and has noticed that it has been elevated over the past week mostly 140/90 has been up to 150s/higher 90s. Has experienced some chest tightness. Is currently on losartan '100mg'$  and hctz 12.'5mg'$ .  States he is taking medication as prescribed  CBG 120 and below in the mornings. States he will check it fasting and in the afternoons at some point.  Review of Systems  Constitutional:  Negative for chills and fever.  Respiratory:  Negative for shortness of breath.   Cardiovascular:  Positive for chest pain (tightness).        Objective:    BP 130/90   Pulse 89   Temp 98.3 F (36.8 C)   Resp 16   Ht '6\' 2"'$  (1.88 m)   Wt (!) 302 lb 9 oz (137.2 kg)   SpO2 98%   BMI 38.85 kg/m  BP Readings from Last 3 Encounters:  08/07/21 130/90  03/20/21 118/80  09/17/20 118/76   Wt Readings from Last 3 Encounters:  08/07/21 (!) 302 lb 9 oz (137.2 kg)  03/20/21 (!) 320 lb (145.2 kg)  09/17/20 (!) 312 lb 12.8 oz (141.9 kg)      Physical Exam Vitals and nursing note reviewed.  Constitutional:      Appearance: Normal appearance. He is obese.  Cardiovascular:     Rate and Rhythm: Normal rate and regular rhythm.     Heart sounds: Normal heart sounds.  Pulmonary:     Effort: Pulmonary effort is normal.     Breath sounds: Normal breath sounds.  Musculoskeletal:     Right lower leg: No edema.     Left lower leg: No edema.  Neurological:     Mental Status: He is alert.     No results found for  any visits on 08/07/21.      Assessment & Plan:   Problem List Items Addressed This Visit       Cardiovascular and Mediastinum   Essential hypertension, benign - Primary    Patient is getting elevated readings at home and to elevated readings in office today.  Patient's been working on weight loss and has lost cerumen weight since his last office visit.  Did have conversation with patient to continue taking losartan 100 mg and we will increase his HCTZ to 25 mg.  He can take 2 of his 12.5 at home until finished and I sent a new prescription for HCTZ 25 mg.  He does have a follow-up with his primary care doctor to go next month.  Encouraged him to keep that appointment at which she can evaluate treatment changes      Relevant Medications   hydrochlorothiazide (HYDRODIURIL) 25 MG tablet   Other Relevant Orders   EKG 12-Lead (Completed)     Other   Chest tightness    Patient experience chest tightness likely secondary to anxiety in regards to having higher  blood pressure readings.  EKG within normal limits in office today.  Follow-up or go to nearest emergency department if symptoms happen again      Relevant Orders   EKG 12-Lead (Completed)    Meds ordered this encounter  Medications   hydrochlorothiazide (HYDRODIURIL) 25 MG tablet    Sig: Take 1 tablet (25 mg total) by mouth daily.    Dispense:  90 tablet    Refill:  0    Order Specific Question:   Supervising Provider    Answer:   Loura Pardon A [[id number]]    Return for As scheduled Dr. Derrel Nip.  Romilda Garret, NP

## 2021-08-07 NOTE — Assessment & Plan Note (Signed)
Patient experience chest tightness likely secondary to anxiety in regards to having higher blood pressure readings.  EKG within normal limits in office today.  Follow-up or go to nearest emergency department if symptoms happen again

## 2021-08-10 ENCOUNTER — Ambulatory Visit: Payer: No Typology Code available for payment source | Admitting: Internal Medicine

## 2021-08-14 ENCOUNTER — Other Ambulatory Visit: Payer: Self-pay

## 2021-09-03 ENCOUNTER — Other Ambulatory Visit: Payer: Self-pay

## 2021-09-07 ENCOUNTER — Ambulatory Visit (INDEPENDENT_AMBULATORY_CARE_PROVIDER_SITE_OTHER): Payer: No Typology Code available for payment source | Admitting: Internal Medicine

## 2021-09-07 ENCOUNTER — Encounter: Payer: Self-pay | Admitting: Internal Medicine

## 2021-09-07 VITALS — BP 136/86 | HR 95 | Temp 98.2°F | Ht 74.0 in | Wt 305.6 lb

## 2021-09-07 DIAGNOSIS — R5383 Other fatigue: Secondary | ICD-10-CM

## 2021-09-07 DIAGNOSIS — Z125 Encounter for screening for malignant neoplasm of prostate: Secondary | ICD-10-CM | POA: Diagnosis not present

## 2021-09-07 DIAGNOSIS — E1169 Type 2 diabetes mellitus with other specified complication: Secondary | ICD-10-CM | POA: Diagnosis not present

## 2021-09-07 DIAGNOSIS — E785 Hyperlipidemia, unspecified: Secondary | ICD-10-CM | POA: Diagnosis not present

## 2021-09-07 DIAGNOSIS — Z6841 Body Mass Index (BMI) 40.0 and over, adult: Secondary | ICD-10-CM

## 2021-09-07 DIAGNOSIS — I10 Essential (primary) hypertension: Secondary | ICD-10-CM | POA: Diagnosis not present

## 2021-09-07 DIAGNOSIS — E032 Hypothyroidism due to medicaments and other exogenous substances: Secondary | ICD-10-CM | POA: Diagnosis not present

## 2021-09-07 DIAGNOSIS — Z0000 Encounter for general adult medical examination without abnormal findings: Secondary | ICD-10-CM

## 2021-09-07 DIAGNOSIS — E669 Obesity, unspecified: Secondary | ICD-10-CM

## 2021-09-07 DIAGNOSIS — Z114 Encounter for screening for human immunodeficiency virus [HIV]: Secondary | ICD-10-CM

## 2021-09-07 LAB — CBC WITH DIFFERENTIAL/PLATELET
Basophils Absolute: 0 10*3/uL (ref 0.0–0.1)
Basophils Relative: 0.7 % (ref 0.0–3.0)
Eosinophils Absolute: 0 10*3/uL (ref 0.0–0.7)
Eosinophils Relative: 0.7 % (ref 0.0–5.0)
HCT: 49.1 % (ref 39.0–52.0)
Hemoglobin: 16.6 g/dL (ref 13.0–17.0)
Lymphocytes Relative: 24.7 % (ref 12.0–46.0)
Lymphs Abs: 1.7 10*3/uL (ref 0.7–4.0)
MCHC: 33.8 g/dL (ref 30.0–36.0)
MCV: 91.8 fl (ref 78.0–100.0)
Monocytes Absolute: 0.7 10*3/uL (ref 0.1–1.0)
Monocytes Relative: 10 % (ref 3.0–12.0)
Neutro Abs: 4.5 10*3/uL (ref 1.4–7.7)
Neutrophils Relative %: 63.9 % (ref 43.0–77.0)
Platelets: 285 10*3/uL (ref 150.0–400.0)
RBC: 5.35 Mil/uL (ref 4.22–5.81)
RDW: 14.1 % (ref 11.5–15.5)
WBC: 7 10*3/uL (ref 4.0–10.5)

## 2021-09-07 LAB — COMPREHENSIVE METABOLIC PANEL
ALT: 41 U/L (ref 0–53)
AST: 30 U/L (ref 0–37)
Albumin: 4.6 g/dL (ref 3.5–5.2)
Alkaline Phosphatase: 73 U/L (ref 39–117)
BUN: 11 mg/dL (ref 6–23)
CO2: 27 mEq/L (ref 19–32)
Calcium: 10.1 mg/dL (ref 8.4–10.5)
Chloride: 99 mEq/L (ref 96–112)
Creatinine, Ser: 0.87 mg/dL (ref 0.40–1.50)
GFR: 100.25 mL/min (ref >60.00)
Glucose, Bld: 163 mg/dL — ABNORMAL HIGH (ref 70–99)
Potassium: 4.2 mEq/L (ref 3.5–5.1)
Sodium: 138 mEq/L (ref 135–145)
Total Bilirubin: 0.8 mg/dL (ref 0.2–1.2)
Total Protein: 7.5 g/dL (ref 6.0–8.3)

## 2021-09-07 LAB — HEMOGLOBIN A1C: Hgb A1c MFr Bld: 6.8 % — ABNORMAL HIGH (ref 4.6–6.5)

## 2021-09-07 LAB — PSA: PSA: 0.61 ng/mL (ref 0.10–4.00)

## 2021-09-07 LAB — TSH: TSH: 0.18 u[IU]/mL — ABNORMAL LOW (ref 0.35–5.50)

## 2021-09-07 MED ORDER — ZOSTER VAC RECOMB ADJUVANTED 50 MCG/0.5ML IM SUSR: 0.5000 mL | Freq: Once | INTRAMUSCULAR | 0 refills | Status: AC

## 2021-09-07 NOTE — Assessment & Plan Note (Signed)

## 2021-09-07 NOTE — Assessment & Plan Note (Addendum)
Managed with Tresiba 20 units daily . Previous trial of metformin was not tolerated due to persistent diarrhea .  No longer taking trulicity, ozempic contrinidcated due to history of thyroid cancer   Lab Results  Component Value Date   HGBA1C 6.8 (H) 09/07/2021

## 2021-09-07 NOTE — Assessment & Plan Note (Signed)
Improved with losartan 100 mg dose.  Stress related.

## 2021-09-07 NOTE — Progress Notes (Signed)
The patient is here for annual preventive examination and management of other chronic and acute problems.  HM:  normal colonoscopy 2016. 10 yr follow up planned  No prior PSA Diabetic eye exam up to date  Foot exam normal today.  .     The risk factors are reflected in the social history.    The roster of all physicians providing medical care to patient - is listed in the Snapshot section of the chart.   Activities of daily living:  The patient is 100% independent in all ADLs: dressing, toileting, feeding as well as independent mobility   Home safety : The patient has smoke detectors in the home. They wear seatbelts.  There are no unsecured firearms at home. There is no violence in the home.    There is no risks for hepatitis, STDs or HIV. There is no   history of blood transfusion. They have no travel history to infectious disease endemic areas of the world.   The patient has seen their dentist in the last six month. They have seen their eye doctor in the last year. The patinet  denies slight hearing difficulty with regard to whispered voices and some television programs.  They have deferred audiologic testing in the last year.  They do not  have excessive sun exposure. Discussed the need for sun protection: hats, long sleeves and use of sunscreen if there is significant sun exposure.    Diet: the importance of a healthy diet is discussed. He has been following a carbohydrate restricted diet. .   The benefits of regular aerobic exercise were discussed. The patient  exercises  3 to 5 days per week  for  60 minutes.    Depression screen: there are no signs or vegative symptoms of depression- irritability, change in appetite, anhedonia, sadness/tearfullness.   The following portions of the patient's history were reviewed and updated as appropriate: allergies, current medications, past family history, past medical history,  past surgical history, past social history  and problem list.    Visual acuity was not assessed per patient preference since the patient has regular follow up with an  ophthalmologist. Hearing and body mass index were assessed and reviewed.    During the course of the visit the patient was educated and counseled about appropriate screening and preventive services including : fall prevention , diabetes screening, nutrition counseling, colorectal cancer screening, and recommended immunizations.    Chief Complaint:  1) DM/obesity :  He feels generally well, is exercising several times per week and checking blood sugars once daily at variable times.  BS have been under 130 fasting and < 150 post prandially.  Denies any recent hypoglyemic events.  Taking his medications as directed. Following a carbohydrate modified diet 6 days per week. Denies numbness, burning and tingling of extremities. No longer taking Trulicity due to non coverage.  His weight is  down 15 lbs.  .  2) left hip and left shoulder arthritis,  early Stage managed with prn aleve .  Can't cross leg any more    Review of Symptoms  Patient denies headache, fevers, malaise, unintentional weight loss, skin rash, eye pain, sinus congestion and sinus pain, sore throat, dysphagia,  hemoptysis , cough, dyspnea, wheezing, chest pain, palpitations, orthopnea, edema, abdominal pain, nausea, melena, diarrhea, constipation, flank pain, dysuria, hematuria, urinary  Frequency, nocturia, numbness, tingling, seizures,  Focal weakness, Loss of consciousness,  Tremor, insomnia, depression, anxiety, and suicidal ideation.    Physical Exam:  BP 136/86 (  BP Location: Left Arm, Patient Position: Sitting, Cuff Size: Large)   Pulse 95   Temp 98.2 F (36.8 C) (Oral)   Ht '6\' 2"'$  (1.88 m)   Wt (!) 305 lb 9.6 oz (138.6 kg)   SpO2 97%   BMI 39.24 kg/m    General appearance: alert, cooperative and appears stated age Ears: normal TM's and external ear canals both ears Throat: lips, mucosa, and tongue normal; teeth and  gums normal Neck: no adenopathy, no carotid bruit, supple, symmetrical, trachea midline and thyroid not enlarged, symmetric, no tenderness/mass/nodules Back: symmetric, no curvature. ROM normal. No CVA tenderness. Lungs: clear to auscultation bilaterally Heart: regular rate and rhythm, S1, S2 normal, no murmur, click, rub or gallop Abdomen: soft, non-tender; bowel sounds normal; no masses,  no organomegaly Pulses: 2+ and symmetric Skin: Skin color, texture, turgor normal. No rashes or lesions Lymph nodes: Cervical, supraclavicular, and axillary nodes normal.   Assessment and Plan:  Essential hypertension, benign Improved with losartan 100 mg dose.  Stress related.    Routine general medical examination at a health care facility age appropriate education and counseling updated, referrals for preventative services and immunizations addressed, dietary and smoking counseling addressed, most recent labs reviewed.  I have personally reviewed and have noted:   1) the patient's medical and social history 2) The pt's use of alcohol, tobacco, and illicit drugs 3) The patient's current medications and supplements 4) Functional ability including ADL's, fall risk, home safety risk, hearing and visual impairment 5) Diet and physical activities 6) Evidence for depression or mood disorder 7) The patient's height, weight, and BMI have been recorded in the chart  I have made referrals, and provided counseling and education based on review of the above  Type 2 diabetes mellitus with obesity ([place]) Managed with Tresiba 20 units daily . Previous trial of metformin was not tolerated due to persistent diarrhea .  No longer taking trulicity, ozempic contrinidcated due to history of thyroid cancer   Lab Results  Component Value Date   HGBA1C 6.8 (H) 09/07/2021     Morbid obesity with BMI of 40.0-44.9, adult ([place]) He is losing weight  Without pharmacotherapy  Hypothyroidism, iatrogenic Managed with 275  mcg levothyroxine  Lab Results  Component Value Date   TSH 0.18 (L) 09/07/2021     Updated Medication List Outpatient Encounter Medications as of 09/07/2021  Medication Sig   fluticasone (FLONASE) 50 MCG/ACT nasal spray PLACE 2 SPRAYS INTO BOTH NOSTRILS DAILY.   hydrochlorothiazide (HYDRODIURIL) 25 MG tablet Take 1 tablet (25 mg total) by mouth daily.   insulin degludec (TRESIBA FLEXTOUCH) 100 UNIT/ML FlexTouch Pen Inject 20 Units into the skin daily.   Insulin Pen Needle (UNIFINE PENTIPS) 31G X 8 MM MISC USE WITH PENS AS DIRECTED   levothyroxine (SYNTHROID) 125 MCG tablet Take 1 tablet (125 mcg total) by mouth daily. PLUS 150 MCG DAILY TOTAL DOSE 275 MCG DAILY   levothyroxine (SYNTHROID) 150 MCG tablet Take 1 tablet (150 mcg total) by mouth daily. Plus 125 mcg daily total daily dose 275 mcg   losartan (COZAAR) 100 MG tablet TAKE 1 TABLET (100 MG TOTAL) BY MOUTH DAILY.   Zoster Vaccine Adjuvanted [facility]) injection Inject 0.5 mLs into the muscle once for 1 dose.   [DISCONTINUED] amoxicillin (AMOXIL) 500 MG capsule Take 1 four times a day until all are taken beginning the AM of surgery   [DISCONTINUED] chlorhexidine (PERIDEX) 0.12 % solution RINSE WITH 1/2 OZ. AND EXPECTORATE BID   [DISCONTINUED] Dulaglutide (TRULICITY)  0.75 MG/0.5ML SOPN INJECT 0.75 MG INTO SKIN ONCE WEEKLY   [DISCONTINUED] naproxen (NAPROSYN) 250 MG tablet TAKE 2 TABLETS AM OF PROCEDURE AND 1 TABLET 3 TIMES A DAY AS NEEDED FOR PAIN. DO NOT EXCEED 5 TABLETS IN A 24 HOUR PERIOD.   No facility-administered encounter medications on file as of 09/07/2021.

## 2021-09-07 NOTE — Assessment & Plan Note (Signed)
Managed with 275 mcg levothyroxine  Lab Results  Component Value Date   TSH 0.18 (L) 09/07/2021

## 2021-09-07 NOTE — Assessment & Plan Note (Signed)
He is losing weight  Without pharmacotherapy

## 2021-09-08 LAB — LIPID PANEL W/REFLEX DIRECT LDL
Cholesterol: 131 mg/dL (ref <200)
HDL: 72 mg/dL (ref >=40)
LDL Cholesterol (Calc): 47 mg/dL (calc)
Non-HDL Cholesterol (Calc): 59 mg/dL (calc) (ref <130)
Total CHOL/HDL Ratio: 1.8 (calc) (ref <5.0)
Triglycerides: 50 mg/dL (ref <150)

## 2021-09-08 LAB — HIV ANTIBODY (ROUTINE TESTING W REFLEX): HIV 1&2 Ab, 4th Generation: NONREACTIVE

## 2021-09-10 NOTE — Addendum Note (Signed)
Addended by: Crecencio Mc on: 09/10/2021 12:20 PM   Modules accepted: Orders

## 2021-09-28 ENCOUNTER — Other Ambulatory Visit: Payer: Self-pay

## 2021-09-28 ENCOUNTER — Other Ambulatory Visit: Payer: Self-pay | Admitting: Nurse Practitioner

## 2021-09-28 DIAGNOSIS — I10 Essential (primary) hypertension: Secondary | ICD-10-CM

## 2021-09-28 MED ORDER — HYDROCHLOROTHIAZIDE 25 MG PO TABS
25.0000 mg | ORAL_TABLET | Freq: Every day | ORAL | 1 refills | Status: DC
  Filled 2021-09-28 – 2021-11-11 (×2): qty 90, 90d supply, fill #0
  Filled 2022-01-01 – 2022-02-11 (×3): qty 90, 90d supply, fill #1

## 2021-10-01 ENCOUNTER — Other Ambulatory Visit: Payer: Self-pay

## 2021-10-01 ENCOUNTER — Other Ambulatory Visit: Payer: Self-pay | Admitting: Internal Medicine

## 2021-10-01 MED ORDER — TRESIBA FLEXTOUCH 100 UNIT/ML ~~LOC~~ SOPN
20.0000 [IU] | PEN_INJECTOR | Freq: Every day | SUBCUTANEOUS | 2 refills | Status: DC
Start: 2021-10-01 — End: 2022-04-20
  Filled 2021-10-01: qty 15, 75d supply, fill #0
  Filled 2021-12-10: qty 15, 75d supply, fill #1
  Filled 2022-02-11 (×2): qty 15, 75d supply, fill #2

## 2021-10-13 ENCOUNTER — Other Ambulatory Visit: Payer: Self-pay

## 2021-10-13 ENCOUNTER — Other Ambulatory Visit: Payer: Self-pay | Admitting: Internal Medicine

## 2021-10-13 MED ORDER — LOSARTAN POTASSIUM 100 MG PO TABS
ORAL_TABLET | Freq: Every day | ORAL | 1 refills | Status: DC
  Filled 2021-10-13: qty 90, 90d supply, fill #0
  Filled 2022-01-01 – 2022-01-13 (×3): qty 90, 90d supply, fill #1

## 2021-10-13 MED ORDER — LEVOTHYROXINE SODIUM 150 MCG PO TABS
150.0000 ug | ORAL_TABLET | Freq: Every day | ORAL | 3 refills | Status: DC
  Filled 2021-10-13: qty 90, 90d supply, fill #0
  Filled 2022-01-01 – 2022-01-13 (×3): qty 90, 90d supply, fill #1
  Filled 2022-04-09: qty 90, 90d supply, fill #2
  Filled 2022-07-11: qty 90, 90d supply, fill #3

## 2021-10-26 ENCOUNTER — Other Ambulatory Visit (HOSPITAL_COMMUNITY): Payer: Self-pay

## 2021-10-26 ENCOUNTER — Other Ambulatory Visit: Payer: Self-pay | Admitting: Internal Medicine

## 2021-10-26 MED ORDER — LEVOTHYROXINE SODIUM 125 MCG PO TABS
125.0000 ug | ORAL_TABLET | Freq: Every day | ORAL | 3 refills | Status: DC
  Filled 2021-10-26: qty 90, 90d supply, fill #0
  Filled 2022-01-01 – 2022-01-13 (×6): qty 90, 90d supply, fill #1
  Filled 2022-04-09: qty 90, 90d supply, fill #2
  Filled 2022-07-11: qty 90, 90d supply, fill #3

## 2021-10-27 ENCOUNTER — Other Ambulatory Visit (HOSPITAL_COMMUNITY): Payer: Self-pay

## 2021-11-11 ENCOUNTER — Other Ambulatory Visit (HOSPITAL_COMMUNITY): Payer: Self-pay

## 2021-12-07 ENCOUNTER — Telehealth: Payer: Self-pay

## 2021-12-07 NOTE — Telephone Encounter (Signed)
Pt called stating that he has been noticing that when he starts driving early in the morning he starts feeling faint when there is shadows in the way or when there is too much light from the sun. Onset Saturday morning while on his way to [place] to take care of his parents he had to stop on the side of the road to switch drivers, due to issue coming & going.

## 2021-12-07 NOTE — Telephone Encounter (Signed)
Pt is scheduled with Dr. Volanda Napoleon tomorrow.

## 2021-12-08 ENCOUNTER — Encounter: Payer: Self-pay | Admitting: Family Medicine

## 2021-12-08 ENCOUNTER — Ambulatory Visit (INDEPENDENT_AMBULATORY_CARE_PROVIDER_SITE_OTHER): Payer: No Typology Code available for payment source | Admitting: Family Medicine

## 2021-12-08 ENCOUNTER — Other Ambulatory Visit: Payer: Self-pay

## 2021-12-08 VITALS — BP 138/84 | HR 94 | Temp 98.4°F | Ht 74.0 in | Wt 305.0 lb

## 2021-12-08 DIAGNOSIS — H6592 Unspecified nonsuppurative otitis media, left ear: Secondary | ICD-10-CM

## 2021-12-08 DIAGNOSIS — I10 Essential (primary) hypertension: Secondary | ICD-10-CM | POA: Diagnosis not present

## 2021-12-08 DIAGNOSIS — E032 Hypothyroidism due to medicaments and other exogenous substances: Secondary | ICD-10-CM

## 2021-12-08 DIAGNOSIS — R55 Syncope and collapse: Secondary | ICD-10-CM

## 2021-12-08 DIAGNOSIS — R42 Dizziness and giddiness: Secondary | ICD-10-CM

## 2021-12-08 MED ORDER — AMOXICILLIN-POT CLAVULANATE 875-125 MG PO TABS
1.0000 | ORAL_TABLET | Freq: Two times a day (BID) | ORAL | 0 refills | Status: AC
  Filled 2021-12-08: qty 10, 5d supply, fill #0

## 2021-12-08 NOTE — Patient Instructions (Signed)
It was a pleasure meeting you today. Thank you for allowing me to take part in your health care.  Our goals for today as we discussed include:  I am concerned with your history of high blood pressure that this could be an early sign of stroke.  While your have no focal neurological symptoms today I recommend imaging of your brain as soon as possible. Will order MRI of your head today If you have any worsening symptoms please go to the emergency department or call 911  For your left ear Will treat with Augmentin 1 tablet two times a day for 5 days Start Probiotics daily and continue for at least 2 weeks after completion of antibiotics Continue Flonase spray  We will get some labs today.  If they are abnormal or we need to do something about them, I will call you.  If they are normal, I will send you a message on MyChart (if it is active) or a letter in the mail.  If you don't hear from [place] in 2 weeks, please call the office at the number below.   Please follow-up with PCP as scheduled or sooner if needed  If you have any questions or concerns, please do not hesitate to call the office at [phone].  I look forward to our next visit and until then take care and stay safe.  Regards,   Carollee Leitz, MD   [place]   Otitis Media With Effusion, Adult  Otitis media with effusion (OME) is inflammation and fluid (effusion) in the middle ear without having an ear infection. The middle ear is the space behind the eardrum. The middle ear is connected to the back of the throat by a narrow tube (eustachian tube). Normally the eustachian tube drains fluid out of the middle ear. A swollen eustachian tube can become blocked and cause fluid to collect in the middle ear. OME often goes away without treatment. Sometimes OME can lead to hearing problems and recurrent acute ear infections (acute otitis media). These conditions may require treatment. What are the causes? OME is caused by  a blocked eustachian tube. This can result from: Allergies. Upper respiratory infections. Enlarged adenoids. The adenoids are areas of soft tissue located high in the back of the throat, behind the nose and the roof of the mouth. They are part of the body's natural defense system (immune system). Rapid changes in pressure, like when an airplane is descending or during scuba diving. In some cases, the cause of this condition is not known. What are the signs or symptoms? Common symptoms of this condition include: A feeling of fullness in your ear. Decreased hearing in the affected ear. Fluid draining into the ear canal. Pain in the ear. In some cases, there are no symptoms. How is this diagnosed?  A health care provider can diagnose OME based on signs and symptoms of the condition. Your provider will also do a physical exam to check for fluid behind the eardrum. During the exam, your health care provider will use an instrument called an otoscope to look in your ear. Your health care provider may do other tests, such as: A hearing test. A tympanogram. This is a test that shows how well the eardrum moves in response to air pressure in the ear canal. It provides a graph for your health care provider to review. A pneumatic otoscopy. This is a test to check how your eardrum moves in response to changes in pressure. It is  done by squeezing a small amount of air into the ear. How is this treated? Treatment for OME depends on the cause of the condition and the severity of symptoms. The first step is often waiting to see if the fluid drains on its own in a few weeks. Home care treatment may include: Over-the-counter pain relievers. A warm, moist cloth placed over the ear. Severe cases may require a procedure to insert tubes in the ears (tympanostomy tubes) to drain the fluid. Follow these instructions at home: Take over-the-counter and prescription medicines only as told by your health care  provider. Keep all follow-up visits. Contact a health care provider if: You have pain that gets worse. Hearing in your affected ear gets worse. You have fluid draining from your ear canal. You have dizziness. You develop a fever. Get help right away if: You develop a severe headache. You completely lose hearing in the affected ear. You have bleeding from your ear canal. You have sudden and severe pain in your ear. These symptoms may represent a serious problem that is an emergency. Do not wait to see if the symptoms will go away. Get medical help right away. Call your local emergency services (911 in the [place] Do not drive yourself to the hospital. Summary Otitis media with effusion (OME) is inflammation and fluid (effusion) in the middle ear without having an ear infection. A swollen eustachian tube can become blocked and cause fluid to collect in the middle ear. Treatment for OME depends on the cause of the condition and the severity of symptoms. Many times, treatment is not needed because the fluid drains on its own in a few weeks. Sometimes OME can lead to hearing problems and recurrent acute ear infections (acute otitis media), which may require treatment. This information is not intended to replace advice given to you by your health care provider. Make sure you discuss any questions you have with your health care provider. Document Revised: 05/15/2020 Document Reviewed: 05/15/2020 Elsevier Patient Education  [place].

## 2021-12-08 NOTE — Progress Notes (Signed)
    SUBJECTIVE:   CHIEF COMPLAINT / HPI: feeling of lightheadedness and faint  Patient reports having weird feeling past few days.  Initially started 5 days ago while driving. Had this feeling of generalized body weakness and faintness.  Did not have any LOC.  Reports that when passing by a sound banner on Highway the reflection of the light made the sensation more intense. Denies any dizziness, headaches, n/v, chest pain, shortness of breath, blurry vision or slurred speech.  Endorses some trouble getting words out yesterday. Has been under a lot of stress lately.  No recent sick contacts and no recent viral illnesses.  PERTINENT  PMH / PSH:  HTN Acquired Hypothyroidism s/p thyroidectomy for Thyroid Ca Migraines without aura   OBJECTIVE:   BP 138/84 (BP Location: Left Arm, Patient Position: Sitting, Cuff Size: Large)   Pulse 94   Temp 98.4 F (36.9 C) (Oral)   Ht '6\' 2"'$  (1.88 m)   Wt (!) 305 lb (138.3 kg)   SpO2 98%   BMI 39.16 kg/m    General: Alert, no acute distress HEENT: Lt TM erythema with effusion, Rt TM dull without effusion or erythema.  TM scarring noted bilaterally. Canals normal.  Tenderness over left tragus.  No cervical lymphadenopathy. Cardio: Normal S1 and S2, RRR, no r/m/g Pulm: CTAB, normal work of breathing Extremities: No peripheral edema.  Neuro: CN II: PERRL CN III, IV,VI: EOMI CV V: Normal sensation in V1, V2, V3 CVII: Symmetric smile and brow raise CN VIII: Normal hearing CN IX,X: Symmetric palate raise  CN XI: 5/5 shoulder shrug CN XII: Symmetric tongue protrusion  UE and LE strength 5/5 2+ UE and LE reflexes  Normal sensation in UE and LE bilaterally  No ataxia with finger to nose, normal heel to shin  Negative Rhomberg     ASSESSMENT/PLAN:   Vertigo Low suspicion for brain bleed but given ongoing x 5 days will obtain MRI head.  Low suspicion for cardiac etiology given no changes on normal ECG.  No focal deficits on exam. Chronic sinus  issues. Treating for left Otitis media with effusion, coverage will cover for sinusitis Stat MRI head Cmet, TSH, Mag and CBC Follow up with results of MRI head Strict return precautions provided  Otitis media with effusion Start Augmentin 875-125 mg BID x 5 days Follow up with PCP as scheduled or sooner if needed    PDMP Reviewed  Carollee Leitz, MD

## 2021-12-09 ENCOUNTER — Other Ambulatory Visit (INDEPENDENT_AMBULATORY_CARE_PROVIDER_SITE_OTHER): Payer: No Typology Code available for payment source

## 2021-12-09 DIAGNOSIS — E032 Hypothyroidism due to medicaments and other exogenous substances: Secondary | ICD-10-CM

## 2021-12-09 LAB — CBC WITH DIFFERENTIAL/PLATELET
Basophils Absolute: 0.1 10*3/uL (ref 0.0–0.1)
Basophils Relative: 1.1 % (ref 0.0–3.0)
Eosinophils Absolute: 0.1 10*3/uL (ref 0.0–0.7)
Eosinophils Relative: 0.6 % (ref 0.0–5.0)
HCT: 50.3 % (ref 39.0–52.0)
Hemoglobin: 17.1 g/dL — ABNORMAL HIGH (ref 13.0–17.0)
Lymphocytes Relative: 26.8 % (ref 12.0–46.0)
Lymphs Abs: 2.4 10*3/uL (ref 0.7–4.0)
MCHC: 34 g/dL (ref 30.0–36.0)
MCV: 92 fl (ref 78.0–100.0)
Monocytes Absolute: 0.9 10*3/uL (ref 0.1–1.0)
Monocytes Relative: 9.6 % (ref 3.0–12.0)
Neutro Abs: 5.6 10*3/uL (ref 1.4–7.7)
Neutrophils Relative %: 61.9 % (ref 43.0–77.0)
Platelets: 300 10*3/uL (ref 150.0–400.0)
RBC: 5.47 Mil/uL (ref 4.22–5.81)
RDW: 13.2 % (ref 11.5–15.5)
WBC: 9.1 10*3/uL (ref 4.0–10.5)

## 2021-12-09 LAB — COMPREHENSIVE METABOLIC PANEL
ALT: 43 U/L (ref 0–53)
AST: 27 U/L (ref 0–37)
Albumin: 4.7 g/dL (ref 3.5–5.2)
Alkaline Phosphatase: 74 U/L (ref 39–117)
BUN: 11 mg/dL (ref 6–23)
CO2: 28 mEq/L (ref 19–32)
Calcium: 10.2 mg/dL (ref 8.4–10.5)
Chloride: 97 mEq/L (ref 96–112)
Creatinine, Ser: 0.9 mg/dL (ref 0.40–1.50)
GFR: 99.05 mL/min (ref >60.00)
Glucose, Bld: 156 mg/dL — ABNORMAL HIGH (ref 70–99)
Potassium: 4.1 mEq/L (ref 3.5–5.1)
Sodium: 135 mEq/L (ref 135–145)
Total Bilirubin: 0.7 mg/dL (ref 0.2–1.2)
Total Protein: 7.5 g/dL (ref 6.0–8.3)

## 2021-12-09 LAB — TSH: TSH: 0.65 u[IU]/mL (ref 0.35–5.50)

## 2021-12-09 LAB — MAGNESIUM: Magnesium: 1.8 mg/dL (ref 1.5–2.5)

## 2021-12-10 ENCOUNTER — Ambulatory Visit
Admission: RE | Admit: 2021-12-10 | Discharge: 2021-12-10 | Disposition: A | Discharge status: Home / Self Care | Payer: No Typology Code available for payment source | Source: Ambulatory Visit | Attending: Family Medicine | Admitting: Family Medicine

## 2021-12-10 ENCOUNTER — Telehealth: Payer: Self-pay

## 2021-12-10 DIAGNOSIS — R42 Dizziness and giddiness: Secondary | ICD-10-CM | POA: Insufficient documentation

## 2021-12-10 DIAGNOSIS — J0100 Acute maxillary sinusitis, unspecified: Secondary | ICD-10-CM

## 2021-12-10 NOTE — Telephone Encounter (Signed)
-----   Message from Carollee Leitz, MD sent at 12/09/2021  5:08 PM EST ----- Lab work acceptable. Please call patient to check if symptoms improving

## 2021-12-10 NOTE — Telephone Encounter (Signed)
LMTCB regarding acceptable lab results and to check if his symptoms are improving?

## 2021-12-11 ENCOUNTER — Other Ambulatory Visit (HOSPITAL_COMMUNITY): Payer: Self-pay

## 2021-12-11 ENCOUNTER — Encounter: Payer: Self-pay | Admitting: Internal Medicine

## 2021-12-14 ENCOUNTER — Encounter: Payer: Self-pay | Admitting: Internal Medicine

## 2021-12-15 ENCOUNTER — Other Ambulatory Visit: Payer: Self-pay

## 2021-12-15 ENCOUNTER — Other Ambulatory Visit: Payer: Self-pay | Admitting: Internal Medicine

## 2021-12-15 DIAGNOSIS — J0100 Acute maxillary sinusitis, unspecified: Secondary | ICD-10-CM | POA: Insufficient documentation

## 2021-12-15 MED ORDER — PREDNISONE 10 MG PO TABS
ORAL_TABLET | ORAL | 0 refills | Status: DC
  Filled 2021-12-15: qty 21, 6d supply, fill #0

## 2021-12-20 ENCOUNTER — Encounter: Payer: Self-pay | Admitting: Family Medicine

## 2021-12-20 DIAGNOSIS — R42 Dizziness and giddiness: Secondary | ICD-10-CM | POA: Insufficient documentation

## 2021-12-20 DIAGNOSIS — H6590 Unspecified nonsuppurative otitis media, unspecified ear: Secondary | ICD-10-CM | POA: Insufficient documentation

## 2021-12-20 NOTE — Assessment & Plan Note (Signed)
Start Augmentin 875-125 mg BID x 5 days Follow up with PCP as scheduled or sooner if needed

## 2021-12-20 NOTE — Assessment & Plan Note (Addendum)
Low suspicion for brain bleed but given ongoing x 5 days will obtain MRI head.  Low suspicion for cardiac etiology given no changes on normal ECG.  No focal deficits on exam. Chronic sinus issues. Treating for left Otitis media with effusion, coverage will cover for sinusitis Stat MRI head Cmet, TSH, Mag and CBC Follow up with results of MRI head Strict return precautions provided

## 2021-12-21 ENCOUNTER — Ambulatory Visit: Payer: No Typology Code available for payment source | Admitting: Internal Medicine

## 2022-01-01 ENCOUNTER — Other Ambulatory Visit (HOSPITAL_COMMUNITY): Payer: Self-pay

## 2022-01-01 ENCOUNTER — Encounter (HOSPITAL_COMMUNITY): Payer: Self-pay

## 2022-01-01 ENCOUNTER — Ambulatory Visit
Admission: EM | Admit: 2022-01-01 | Discharge: 2022-01-01 | Disposition: A | Discharge status: Home / Self Care | Payer: No Typology Code available for payment source | Attending: Emergency Medicine | Admitting: Emergency Medicine

## 2022-01-01 ENCOUNTER — Other Ambulatory Visit: Payer: Self-pay

## 2022-01-01 DIAGNOSIS — J329 Chronic sinusitis, unspecified: Secondary | ICD-10-CM | POA: Insufficient documentation

## 2022-01-01 DIAGNOSIS — Z1152 Encounter for screening for COVID-19: Secondary | ICD-10-CM | POA: Diagnosis not present

## 2022-01-01 DIAGNOSIS — R0982 Postnasal drip: Secondary | ICD-10-CM | POA: Diagnosis present

## 2022-01-01 DIAGNOSIS — R051 Acute cough: Secondary | ICD-10-CM | POA: Insufficient documentation

## 2022-01-01 DIAGNOSIS — B9789 Other viral agents as the cause of diseases classified elsewhere: Secondary | ICD-10-CM | POA: Diagnosis not present

## 2022-01-01 MED ORDER — BENZONATATE 100 MG PO CAPS
100.0000 mg | ORAL_CAPSULE | Freq: Three times a day (TID) | ORAL | 0 refills | Status: DC | PRN
  Filled 2022-01-01: qty 21, 7d supply, fill #0

## 2022-01-01 NOTE — ED Provider Notes (Signed)
Steven Hoover    CSN: [id number] Arrival date & time: 01/01/22  0930      History   Chief Complaint Chief Complaint  Patient presents with   Nasal Congestion    HPI Steven Hoover is a 51 y.o. male.  Patient presents with 3-day history of nasal congestion, postnasal drip, cough.  Treating symptoms with dayquil, nyquil, allergy medication.  He denies fever, rash, sore throat, chest pain, shortness of breath, vomiting, diarrhea, or other symptoms.  He was treated with Augmentin on 12/08/2021 and with prednisone on 12/15/2021.  He had lab work on 12/08/2021.  He had a brain MRI on 12/10/2021.  His medical history includes hypertension, diabetes, OSA, morbid obesity, hypothyroid, vertigo, anxiety, depression.  The history is provided by the patient and medical records.    Past Medical History:  Diagnosis Date   Anxiety    Back pain    Cancer ([place])    thyroid   Depression    Diabetes mellitus without complication ([place])    Hypertension    OSA on CPAP 2011   on auto VPAP, sleeping well humidied 02   Sinusitis    Thyroid disease     Patient Active Problem List   Diagnosis Date Noted   Otitis media with effusion 12/20/2021   Vertigo 12/20/2021   Sinusitis, acute maxillary 12/15/2021   Chest tightness 08/07/2021   Constipation 03/20/2021   RUQ pain 03/20/2021   Obesity, diabetes, and hypertension syndrome ([place]) 09/19/2020   Type 2 diabetes mellitus with obesity ([place]) 06/05/2020   Tinnitus of both ears 03/15/2020   Eustachian tube disorder 03/15/2020   Acute left ankle pain 11/08/2019   Foot arch pain, left 11/08/2019   Eczema of external ear, right 05/07/2019   Congenital spinal stenosis of lumbar region 04/25/2016   History of thyroid cancer 02/08/2016   Chest pain 02/05/2013   Essential hypertension, benign 12/29/2012   Routine general medical examination at a health care facility 07/18/2012   OSA (obstructive sleep apnea) 07/17/2012   Hypothyroidism,  iatrogenic 46/27/0350   Hernia, umbilical 09/38/1829   Fatty liver disease, nonalcoholic 93/71/6967   Morbid obesity with BMI of 40.0-44.9, adult ([place]) 03/16/2011   Anxiety 03/16/2011    Past Surgical History:  Procedure Laterality Date   adnoidectomy     COLONOSCOPY WITH PROPOFOL N/A 06/28/2014   Procedure: COLONOSCOPY WITH PROPOFOL;  Surgeon: Josefine Class, MD;  Location: [facility] ENDOSCOPY;  Service: Endoscopy;  Laterality: N/A;   TOTAL THYROIDECTOMY  2004       Home Medications    Prior to Admission medications   Medication Sig Start Date End Date Taking? Authorizing Provider  benzonatate (TESSALON) 100 MG capsule Take 1 capsule (100 mg total) by mouth 3 (three) times daily as needed for cough. 01/01/22  Yes Sharion Balloon, NP  fluticasone (FLONASE) 50 MCG/ACT nasal spray PLACE 2 SPRAYS INTO BOTH NOSTRILS DAILY. 01/13/21 01/13/22  Crecencio Mc, MD  hydrochlorothiazide (HYDRODIURIL) 25 MG tablet Take 1 tablet (25 mg total) by mouth daily. 09/28/21 03/27/22  Crecencio Mc, MD  insulin degludec (TRESIBA FLEXTOUCH) 100 UNIT/ML FlexTouch Pen Inject 20 Units into the skin daily. 10/01/21 10/01/22  Dutch Quint B, FNP  Insulin Pen Needle (UNIFINE PENTIPS) 31G X 8 MM MISC USE WITH PENS AS DIRECTED 04/20/21 04/20/22  Crecencio Mc, MD  levothyroxine (SYNTHROID) 125 MCG tablet Take 1 tablet (125 mcg total) by mouth daily. PLUS 150 MCG DAILY TOTAL DOSE 275 MCG DAILY 10/26/21 10/26/22  Crecencio Mc, MD  levothyroxine (SYNTHROID) 150 MCG tablet Take 1 tablet (150 mcg total) by mouth daily. Plus 125 mcg daily total daily dose 275 mcg 10/13/21   Dutch Quint B, FNP  losartan (COZAAR) 100 MG tablet TAKE 1 TABLET (100 MG TOTAL) BY MOUTH DAILY. 10/13/21 10/13/22  Kennyth Arnold, FNP  predniSONE (DELTASONE) 10 MG tablet Take 6 tablets by mouth on Day 1 , then reduce by 1 tablet daily until gone 12/15/21   Crecencio Mc, MD    Family History Family History  Problem Relation Age of Onset    Diabetes Father    Parkinson's disease Father 17   COPD Neg Hx     Social History Social History   Tobacco Use   Smoking status: Former    Types: Cigarettes    Quit date: 08/02/2010    Years since quitting: 11.4   Smokeless tobacco: Never  Substance Use Topics   Alcohol use: Yes    Alcohol/week: 5.0 standard drinks of alcohol    Types: 5 Glasses of wine per week    Comment: prior heavy use for several months    Drug use: No     Allergies   Patient has no known allergies.   Review of Systems Review of Systems  Constitutional:  Negative for chills and fever.  HENT:  Positive for congestion, postnasal drip and rhinorrhea. Negative for ear pain and sore throat.   Respiratory:  Positive for cough. Negative for shortness of breath.   Cardiovascular:  Negative for chest pain and palpitations.  Gastrointestinal:  Negative for diarrhea and vomiting.  Skin:  Negative for color change and rash.  All other systems reviewed and are negative.    Physical Exam Triage Vital Signs ED Triage Vitals  Enc Vitals Group     BP --      Pulse Rate 01/01/22 0951 96     Resp 01/01/22 0951 18     Temp 01/01/22 0951 99.1 F (37.3 C)     Temp src --      SpO2 01/01/22 0951 98 %     Weight --      Height --      Head Circumference --      Peak Flow --      Pain Score 01/01/22 0957 0     Pain Loc --      Pain Edu? --      Excl. in [place]? --    No data found.  Updated Vital Signs Pulse 96   Temp 99.1 F (37.3 C)   Resp 18   SpO2 98%   Visual Acuity Right Eye Distance:   Left Eye Distance:   Bilateral Distance:    Right Eye Near:   Left Eye Near:    Bilateral Near:     Physical Exam Vitals and nursing note reviewed.  Constitutional:      General: He is not in acute distress.    Appearance: He is well-developed. He is not ill-appearing.  HENT:     Right Ear: Tympanic membrane normal.     Left Ear: Tympanic membrane normal.     Nose: Congestion and rhinorrhea present.      Mouth/Throat:     Mouth: Mucous membranes are moist.     Pharynx: Oropharynx is clear.  Cardiovascular:     Rate and Rhythm: Normal rate and regular rhythm.     Heart sounds: Normal heart sounds.  Pulmonary:     Effort:  Pulmonary effort is normal. No respiratory distress.     Breath sounds: Normal breath sounds.  Musculoskeletal:     Cervical back: Neck supple.  Skin:    General: Skin is warm and dry.  Neurological:     Mental Status: He is alert.  Psychiatric:        Mood and Affect: Mood normal.        Behavior: Behavior normal.      UC Treatments / Results  Labs (all labs ordered are listed, but only abnormal results are displayed) Labs Reviewed  SARS CORONAVIRUS 2 (TAT 6-24 HRS)    EKG   Radiology No results found.  Procedures Procedures (including critical care time)  Medications Ordered in UC Medications - No data to display  Initial Impression / Assessment and Plan / UC Course  I have reviewed the triage vital signs and the nursing notes.  Pertinent labs & imaging results that were available during my care of the patient were reviewed by me and considered in my medical decision making (see chart for details).   Cough, postnasal drip, viral sinusitis.  Treating cough with Tessalon Perles.  COVID pending.  Discussed symptomatic treatment including Tylenol or ibuprofen, rest, hydration.  Instructed patient to follow up with PCP if symptoms are not improving.  He agrees to plan of care.    Final Clinical Impressions(s) / UC Diagnoses   Final diagnoses:  Postnasal drip  Acute cough  Viral sinusitis     Discharge Instructions      Take the Tessalon Perles as directed.    Your COVID test is pending.    Take Tylenol or ibuprofen as needed for fever or discomfort.  Rest and keep yourself hydrated.    Follow-up with your primary care provider if your symptoms are not improving.         ED Prescriptions     Medication Sig Dispense Auth.  Provider   benzonatate (TESSALON) 100 MG capsule Take 1 capsule (100 mg total) by mouth 3 (three) times daily as needed for cough. 21 capsule Sharion Balloon, NP      I have reviewed the PDMP during this encounter.   Sharion Balloon, NP 01/01/22 1044

## 2022-01-01 NOTE — Discharge Instructions (Addendum)
Take the [facility] as directed.    Your COVID test is pending.    Take Tylenol or ibuprofen as needed for fever or discomfort.  Rest and keep yourself hydrated.    Follow-up with your primary care provider if your symptoms are not improving.

## 2022-01-01 NOTE — ED Triage Notes (Addendum)
Patient to Urgent Care with complaints of chest and head congestion. Symptoms started Tuesday night. Denies any known fevers. Negative covid test at home.  Has been irrigating sinuses and dayquil/ nyquil. Has also been taking allergy medication.   Reports two weeks ago was tx with amoxicillin and prednisone.

## 2022-01-02 LAB — SARS CORONAVIRUS 2 (TAT 6-24 HRS): SARS Coronavirus 2: NEGATIVE

## 2022-01-06 ENCOUNTER — Other Ambulatory Visit (HOSPITAL_COMMUNITY): Payer: Self-pay

## 2022-01-11 ENCOUNTER — Other Ambulatory Visit (HOSPITAL_COMMUNITY): Payer: Self-pay

## 2022-01-13 ENCOUNTER — Other Ambulatory Visit: Payer: Self-pay

## 2022-01-13 ENCOUNTER — Other Ambulatory Visit (HOSPITAL_COMMUNITY): Payer: Self-pay

## 2022-01-13 NOTE — Telephone Encounter (Signed)
MyChart messgae sent to patient. 

## 2022-01-14 ENCOUNTER — Other Ambulatory Visit (HOSPITAL_COMMUNITY): Payer: Self-pay

## 2022-01-14 ENCOUNTER — Encounter (HOSPITAL_COMMUNITY): Payer: Self-pay

## 2022-01-21 ENCOUNTER — Other Ambulatory Visit (HOSPITAL_COMMUNITY): Payer: Self-pay

## 2022-02-11 ENCOUNTER — Other Ambulatory Visit (HOSPITAL_COMMUNITY): Payer: Self-pay

## 2022-02-12 ENCOUNTER — Other Ambulatory Visit: Payer: Self-pay

## 2022-02-12 ENCOUNTER — Other Ambulatory Visit (HOSPITAL_COMMUNITY): Payer: Self-pay

## 2022-02-17 ENCOUNTER — Other Ambulatory Visit (HOSPITAL_COMMUNITY): Payer: Self-pay

## 2022-02-24 ENCOUNTER — Emergency Department
Admission: EM | Admit: 2022-02-24 | Discharge: 2022-02-24 | Disposition: A | Discharge status: Home / Self Care | Payer: 59 | Attending: Emergency Medicine | Admitting: Emergency Medicine

## 2022-02-24 ENCOUNTER — Emergency Department: Payer: 59

## 2022-02-24 ENCOUNTER — Other Ambulatory Visit: Payer: Self-pay

## 2022-02-24 DIAGNOSIS — R42 Dizziness and giddiness: Secondary | ICD-10-CM | POA: Insufficient documentation

## 2022-02-24 DIAGNOSIS — R079 Chest pain, unspecified: Secondary | ICD-10-CM | POA: Insufficient documentation

## 2022-02-24 DIAGNOSIS — R0789 Other chest pain: Secondary | ICD-10-CM | POA: Diagnosis not present

## 2022-02-24 LAB — D-DIMER, QUANTITATIVE: D-Dimer, Quant: 0.31 ug/mL-FEU (ref 0.00–0.50)

## 2022-02-24 LAB — CBC
HCT: 50.7 % (ref 39.0–52.0)
Hemoglobin: 16.9 g/dL (ref 13.0–17.0)
MCH: 30.1 pg (ref 26.0–34.0)
MCHC: 33.3 g/dL (ref 30.0–36.0)
MCV: 90.2 fL (ref 80.0–100.0)
Platelets: 284 10*3/uL (ref 150–400)
RBC: 5.62 MIL/uL (ref 4.22–5.81)
RDW: 13.1 % (ref 11.5–15.5)
WBC: 8.1 10*3/uL (ref 4.0–10.5)
nRBC: 0 % (ref 0.0–0.2)

## 2022-02-24 LAB — BASIC METABOLIC PANEL
Anion gap: 13 (ref 5–15)
BUN: 11 mg/dL (ref 6–20)
CO2: 25 mmol/L (ref 22–32)
Calcium: 9.5 mg/dL (ref 8.9–10.3)
Chloride: 95 mmol/L — ABNORMAL LOW (ref 98–111)
Creatinine, Ser: 0.77 mg/dL (ref 0.61–1.24)
GFR, Estimated: >60 mL/min (ref >60)
Glucose, Bld: 213 mg/dL — ABNORMAL HIGH (ref 70–99)
Potassium: 4.2 mmol/L (ref 3.5–5.1)
Sodium: 133 mmol/L — ABNORMAL LOW (ref 135–145)

## 2022-02-24 LAB — TROPONIN I (HIGH SENSITIVITY)
Troponin I (High Sensitivity): 4 ng/L (ref <18)
Troponin I (High Sensitivity): 4 ng/L (ref <18)

## 2022-02-24 MED ORDER — ALUM & MAG HYDROXIDE-SIMETH 200-200-20 MG/5ML PO SUSP
30.0000 mL | Freq: Once | ORAL | Status: AC
  Administered 2022-02-24: 30 mL via ORAL
  Filled 2022-02-24: qty 30

## 2022-02-24 MED ORDER — LIDOCAINE VISCOUS HCL 2 % MT SOLN
15.0000 mL | Freq: Once | OROMUCOSAL | Status: AC
  Administered 2022-02-24: 15 mL via ORAL
  Filled 2022-02-24: qty 15

## 2022-02-24 NOTE — ED Triage Notes (Signed)
Pt here with cp and neck pain. Pt states he was feeling dizzy on the way to work, pt states his HR was elevated on his monitor. Pt felt palpitations and came to the ED. Pt denies N/V/D.

## 2022-02-24 NOTE — ED Provider Notes (Signed)
[facility] Provider Note    Event Date/Time   First MD Initiated Contact with Patient 02/24/22 1405     (approximate)   History   Chest Pain   HPI  TARIN NAVAREZ is a 52 y.o. male  who presents to the emergency department today because of concern for chest pain and dizziness.  He states that his symptoms occurred while he was on his way to work.  Chest pain was located in the center part of his chest and he did feel lightheaded.  Felt like his heart was racing.  He does state that over the past few days he has been having pain in between his shoulder blades and on the left side of his neck.  He did think initially that it might be related to muscle spasm so had been trying to massage it.  At the time my exam the patient feels better.  He still has some chest discomfort although does not feel as dizzy.  He denies any associated nausea vomiting or diarrhea.  Denies any recent new medications.  Denies any recent travel or abnormal exertion.      Physical Exam   Triage Vital Signs: ED Triage Vitals  Enc Vitals Group     BP 02/24/22 0948 (!) 145/93     Pulse Rate 02/24/22 0948 (!) 101     Resp 02/24/22 0948 18     Temp 02/24/22 0948 99 F (37.2 C)     Temp Source 02/24/22 0948 Oral     SpO2 02/24/22 0948 99 %     Weight 02/24/22 0947 (!) 304 lb 14.3 oz (138.3 kg)     Height 02/24/22 0947 '6\' 2"'$  (1.88 m)     Head Circumference --      Peak Flow --      Pain Score 02/24/22 0946 8     Pain Loc --      Pain Edu? --      Excl. in [place]? --     Most recent vital signs: Vitals:   02/24/22 0948 02/24/22 1316  BP: (!) 145/93 (!) 132/90  Pulse: (!) 101 95  Resp: 18 16  Temp: 99 F (37.2 C) 98.2 F (36.8 C)  SpO2: 99% 97%   General: Awake, alert, oriented. CV:  Good peripheral perfusion. Regular rate and rhythm. Resp:  Normal effort. Lungs clear. Abd:  No distention.    ED Results / Procedures / Treatments   Labs (all labs ordered are listed,  but only abnormal results are displayed) Labs Reviewed  BASIC METABOLIC PANEL - Abnormal; Notable for the following components:      Result Value   Sodium 133 (*)    Chloride 95 (*)    Glucose, Bld 213 (*)    All other components within normal limits  CBC  TROPONIN I (HIGH SENSITIVITY)  TROPONIN I (HIGH SENSITIVITY)     EKG  I, Nance Pear, attending physician, personally viewed and interpreted this EKG  EKG Time: 0946 Rate: 117 Rhythm: sinus tachycardia with pvc Axis: normal Intervals: qtc 451 QRS: narrow ST changes: no st elevation Impression: abnormal ekg    RADIOLOGY I independently interpreted and visualized the CXR. My interpretation: No pneumonia Radiology interpretation:  IMPRESSION:  No acute cardiopulmonary findings.     PROCEDURES:  Critical Care performed: No  Procedures   MEDICATIONS ORDERED IN ED: Medications - No data to display   IMPRESSION / MDM / [place] / ED COURSE  I reviewed the triage vital signs and the nursing notes.                              Differential diagnosis includes, but is not limited to, acs, pe, pneumonia, ptx, esophagitis  Patient's presentation is most consistent with acute presentation with potential threat to life or bodily function.  Patient presented to the emergency department today after episode of chest pain and dizziness.  This is in the setting of a few day history of pain to his left neck and between his shoulder blades.  EKG here without concerning arrhythmia or ST elevation.  Chest x-ray without widened mediastinum, pneumonia or pneumothorax.  Troponin was negative x 2. D-dimer negative. Patient did have some improvement of his chest pain with the GI cocktail. At this time given reassuring work up I think it is reasonable for patient to be discharged home. Discussed importance of follow up with PCP. Additionally will give dietary guidelines for GERD. Patient has omeprazole at home which he  takes occasionally, discussed taking it daily.    FINAL CLINICAL IMPRESSION(S) / ED DIAGNOSES   Final diagnoses:  Nonspecific chest pain     Note:  This document was prepared using Dragon voice recognition software and may include unintentional dictation errors.    Nance Pear, MD 02/24/22 [id number]

## 2022-02-24 NOTE — Discharge Instructions (Addendum)
Please seek medical attention for any high fevers, chest pain, shortness of breath, change in behavior, persistent vomiting, bloody stool or any other new or concerning symptoms.  

## 2022-03-13 ENCOUNTER — Other Ambulatory Visit: Payer: Self-pay | Admitting: Internal Medicine

## 2022-03-15 ENCOUNTER — Other Ambulatory Visit: Payer: Self-pay

## 2022-03-15 MED ORDER — FLUTICASONE PROPIONATE 50 MCG/ACT NA SUSP
2.0000 | Freq: Every day | NASAL | 2 refills | Status: DC
  Filled 2022-03-15: qty 16, 30d supply, fill #0
  Filled 2022-04-09: qty 16, 30d supply, fill #1
  Filled 2022-04-23: qty 16, 30d supply, fill #2

## 2022-04-09 ENCOUNTER — Other Ambulatory Visit: Payer: Self-pay | Admitting: Family

## 2022-04-12 ENCOUNTER — Other Ambulatory Visit: Payer: Self-pay

## 2022-04-17 ENCOUNTER — Other Ambulatory Visit: Payer: Self-pay | Admitting: Family

## 2022-04-19 ENCOUNTER — Other Ambulatory Visit: Payer: Self-pay | Admitting: Internal Medicine

## 2022-04-19 ENCOUNTER — Other Ambulatory Visit: Payer: Self-pay

## 2022-04-20 ENCOUNTER — Other Ambulatory Visit (HOSPITAL_COMMUNITY): Payer: Self-pay

## 2022-04-20 ENCOUNTER — Other Ambulatory Visit: Payer: Self-pay | Admitting: Internal Medicine

## 2022-04-20 ENCOUNTER — Other Ambulatory Visit: Payer: Self-pay

## 2022-04-20 MED ORDER — LOSARTAN POTASSIUM 100 MG PO TABS
100.0000 mg | ORAL_TABLET | Freq: Every day | ORAL | 1 refills | Status: DC
  Filled 2022-04-20: qty 90, 90d supply, fill #0
  Filled 2022-07-19: qty 90, 90d supply, fill #1

## 2022-04-20 MED FILL — Insulin Degludec Soln Pen-Injector 100 Unit/ML: SUBCUTANEOUS | 75 days supply | Qty: 15 | Fill #0 | Status: AC

## 2022-04-22 ENCOUNTER — Other Ambulatory Visit (HOSPITAL_COMMUNITY): Payer: Self-pay

## 2022-04-23 ENCOUNTER — Other Ambulatory Visit: Payer: Self-pay

## 2022-05-07 ENCOUNTER — Encounter: Payer: Self-pay | Admitting: Internal Medicine

## 2022-05-19 ENCOUNTER — Other Ambulatory Visit: Payer: Self-pay | Admitting: Internal Medicine

## 2022-05-19 DIAGNOSIS — I10 Essential (primary) hypertension: Secondary | ICD-10-CM

## 2022-05-21 ENCOUNTER — Other Ambulatory Visit (HOSPITAL_COMMUNITY): Payer: Self-pay

## 2022-05-21 ENCOUNTER — Other Ambulatory Visit: Payer: Self-pay

## 2022-05-21 DIAGNOSIS — I10 Essential (primary) hypertension: Secondary | ICD-10-CM

## 2022-05-21 MED ORDER — HYDROCHLOROTHIAZIDE 25 MG PO TABS
25.0000 mg | ORAL_TABLET | Freq: Every day | ORAL | 1 refills | Status: DC
  Filled 2022-05-21 – 2022-05-24 (×2): qty 90, 90d supply, fill #0

## 2022-05-24 ENCOUNTER — Other Ambulatory Visit: Payer: Self-pay

## 2022-05-24 ENCOUNTER — Other Ambulatory Visit (HOSPITAL_COMMUNITY): Payer: Self-pay

## 2022-06-19 ENCOUNTER — Other Ambulatory Visit: Payer: Self-pay | Admitting: Internal Medicine

## 2022-06-19 MED FILL — Insulin Degludec Soln Pen-Injector 100 Unit/ML: SUBCUTANEOUS | 75 days supply | Qty: 15 | Fill #1 | Status: AC

## 2022-06-20 ENCOUNTER — Other Ambulatory Visit: Payer: Self-pay | Admitting: Internal Medicine

## 2022-06-20 ENCOUNTER — Other Ambulatory Visit: Payer: Self-pay

## 2022-06-21 ENCOUNTER — Other Ambulatory Visit: Payer: Self-pay

## 2022-06-21 ENCOUNTER — Other Ambulatory Visit: Payer: Self-pay | Admitting: Internal Medicine

## 2022-06-21 MED FILL — Insulin Pen Needle 31 G X 8 MM (1/3" or 5/16"): 90 days supply | Qty: 100 | Fill #0 | Status: AC

## 2022-06-21 MED FILL — Fluticasone Propionate Nasal Susp 50 MCG/ACT: NASAL | 30 days supply | Qty: 16 | Fill #0 | Status: AC

## 2022-06-23 ENCOUNTER — Other Ambulatory Visit: Payer: Self-pay

## 2022-06-23 ENCOUNTER — Telehealth: Payer: Self-pay

## 2022-06-23 ENCOUNTER — Encounter: Payer: Self-pay | Admitting: Internal Medicine

## 2022-06-23 DIAGNOSIS — R5383 Other fatigue: Secondary | ICD-10-CM

## 2022-06-23 DIAGNOSIS — E032 Hypothyroidism due to medicaments and other exogenous substances: Secondary | ICD-10-CM

## 2022-06-23 DIAGNOSIS — E1159 Type 2 diabetes mellitus with other circulatory complications: Secondary | ICD-10-CM

## 2022-06-23 DIAGNOSIS — E785 Hyperlipidemia, unspecified: Secondary | ICD-10-CM

## 2022-06-23 NOTE — Telephone Encounter (Signed)
Future orders placed 

## 2022-07-11 MED FILL — Fluticasone Propionate Nasal Susp 50 MCG/ACT: NASAL | 30 days supply | Qty: 16 | Fill #1 | Status: CN

## 2022-07-12 ENCOUNTER — Other Ambulatory Visit (HOSPITAL_COMMUNITY): Payer: Self-pay

## 2022-07-15 ENCOUNTER — Encounter: Payer: Self-pay | Admitting: Internal Medicine

## 2022-07-15 DIAGNOSIS — H524 Presbyopia: Secondary | ICD-10-CM | POA: Diagnosis not present

## 2022-07-15 DIAGNOSIS — E119 Type 2 diabetes mellitus without complications: Secondary | ICD-10-CM | POA: Diagnosis not present

## 2022-07-15 DIAGNOSIS — M3501 Sicca syndrome with keratoconjunctivitis: Secondary | ICD-10-CM | POA: Diagnosis not present

## 2022-07-15 LAB — HM DIABETES EYE EXAM

## 2022-07-15 MED FILL — Fluticasone Propionate Nasal Susp 50 MCG/ACT: NASAL | 30 days supply | Qty: 16 | Fill #1 | Status: AC

## 2022-07-28 ENCOUNTER — Encounter: Payer: Self-pay | Admitting: Internal Medicine

## 2022-07-28 ENCOUNTER — Ambulatory Visit: Payer: 59 | Attending: Internal Medicine

## 2022-07-28 ENCOUNTER — Other Ambulatory Visit: Payer: Self-pay

## 2022-07-28 ENCOUNTER — Ambulatory Visit (INDEPENDENT_AMBULATORY_CARE_PROVIDER_SITE_OTHER): Payer: 59 | Admitting: Internal Medicine

## 2022-07-28 VITALS — BP 134/82 | HR 84 | Temp 97.7°F | Ht 74.0 in | Wt 305.2 lb

## 2022-07-28 DIAGNOSIS — E1169 Type 2 diabetes mellitus with other specified complication: Secondary | ICD-10-CM

## 2022-07-28 DIAGNOSIS — R55 Syncope and collapse: Secondary | ICD-10-CM | POA: Diagnosis not present

## 2022-07-28 DIAGNOSIS — I10 Essential (primary) hypertension: Secondary | ICD-10-CM | POA: Diagnosis not present

## 2022-07-28 DIAGNOSIS — R079 Chest pain, unspecified: Secondary | ICD-10-CM | POA: Diagnosis not present

## 2022-07-28 DIAGNOSIS — Z6841 Body Mass Index (BMI) 40.0 and over, adult: Secondary | ICD-10-CM

## 2022-07-28 DIAGNOSIS — E669 Obesity, unspecified: Secondary | ICD-10-CM | POA: Diagnosis not present

## 2022-07-28 DIAGNOSIS — E785 Hyperlipidemia, unspecified: Secondary | ICD-10-CM | POA: Diagnosis not present

## 2022-07-28 DIAGNOSIS — E032 Hypothyroidism due to medicaments and other exogenous substances: Secondary | ICD-10-CM

## 2022-07-28 DIAGNOSIS — K760 Fatty (change of) liver, not elsewhere classified: Secondary | ICD-10-CM

## 2022-07-28 DIAGNOSIS — R42 Dizziness and giddiness: Secondary | ICD-10-CM

## 2022-07-28 MED ORDER — HYDROCHLOROTHIAZIDE 25 MG PO TABS
25.0000 mg | ORAL_TABLET | Freq: Every day | ORAL | 1 refills | Status: DC
  Filled 2022-07-28 – 2022-08-16 (×2): qty 90, 90d supply, fill #0

## 2022-07-28 MED ORDER — LEVOTHYROXINE SODIUM 150 MCG PO TABS
150.0000 ug | ORAL_TABLET | Freq: Every day | ORAL | 3 refills | Status: DC
  Filled 2022-07-28 – 2022-10-07 (×2): qty 90, 90d supply, fill #0
  Filled 2023-01-20: qty 90, 90d supply, fill #1
  Filled 2023-04-28: qty 90, 90d supply, fill #2
  Filled 2023-07-24: qty 90, 90d supply, fill #3

## 2022-07-28 MED ORDER — TRESIBA FLEXTOUCH 100 UNIT/ML ~~LOC~~ SOPN
20.0000 [IU] | PEN_INJECTOR | Freq: Every day | SUBCUTANEOUS | 2 refills | Status: DC
  Filled 2022-07-28 – 2022-08-30 (×2): qty 15, 75d supply, fill #0
  Filled 2022-09-14 – 2022-11-19 (×2): qty 15, 75d supply, fill #1
  Filled 2023-02-14: qty 15, 75d supply, fill #2

## 2022-07-28 MED ORDER — LEVOTHYROXINE SODIUM 125 MCG PO TABS
125.0000 ug | ORAL_TABLET | Freq: Every day | ORAL | 3 refills | Status: DC
  Filled 2022-07-28 – 2022-10-07 (×2): qty 90, 90d supply, fill #0
  Filled 2023-01-20: qty 90, 90d supply, fill #1
  Filled 2023-04-28: qty 90, 90d supply, fill #2
  Filled 2023-07-24: qty 30, 30d supply, fill #3

## 2022-07-28 MED ORDER — LOSARTAN POTASSIUM 100 MG PO TABS
100.0000 mg | ORAL_TABLET | Freq: Every day | ORAL | 1 refills | Status: DC
  Filled 2022-07-28: qty 90, 90d supply, fill #0

## 2022-07-28 NOTE — Patient Instructions (Addendum)
Please schedule your CPE separately from your diabetes follow up due to time limitations  You should be seen every 6 months for diabetes follow up  I have made a referral to Dr Juliann Pares and ordered a ZIO 14 day cardiac monitor.  It will be sent to your home.  PUT IT ON AND WEAR IT 24/.7 AND DO ALL OF YOUR USUAL ACTIVITIES.    LET ME KNOW who you would like to see in [place] for ENT and I will make the referral

## 2022-07-28 NOTE — Progress Notes (Unsigned)
Subjective:  Patient ID: Steven Hoover, male    DOB: 1970/08/10  Age: 52 y.o. MRN: [id number]  CC: The primary encounter diagnosis was Type 2 diabetes mellitus with obesity (HCC). Diagnoses of Essential hypertension, benign, Hypothyroidism, iatrogenic, and Hyperlipidemia, unspecified hyperlipidemia type were also pertinent to this visit.   HPI [name] presents for  Chief Complaint  Patient presents with   Medical Management of Chronic Issues   LAST SEEN n August  2023 FOR CPE   has been lost to follow up.  Not checking sugars .  Walking for 2 miles 3-4 days per week  at 6 am until the heat wave.  Has changed to diet sodas and limiting use to one daily.  Overloaded at work.  Working 12 hours daily  passed over for a promotion.  He is frustrated   tired and bitter.    Recurrent vertigo. Happening 1-2 times weekly at the most,  now occurring once a month  at rest. Accompanied by chest pressure/tightness.  Symptoms last 30 minutes   One occurred while driving and he nearly passed out .  Treated in ER   Oct   Left shoulder pain and left hip are constant pain   Sinus congestion, chronic:   managed with azelastine,  zyrtec and nasal rinses . Has seen ENT [facility] twice in the last 12 months due to persistent congestion, drainage and sinus headaches sometimes daily .  Wants a second opinion      Review of Systems;  Patient denies headache, fevers, malaise, unintentional weight loss, skin rash, eye pain, sinus congestion and sinus pain, sore throat, dysphagia,  hemoptysis , cough, dyspnea, wheezing, chest pain, palpitations, orthopnea, edema, abdominal pain, nausea, melena, diarrhea, constipation, flank pain, dysuria, hematuria, urinary  Frequency, nocturia, numbness, tingling, seizures,  Focal weakness, Loss of consciousness,  Tremor, insomnia, depression, anxiety, and suicidal ideation.      Objective:  BP 134/82   Pulse 84   Temp 97.7 F (36.5 C) (Oral)   Ht 6\' 2"  (1.88 m)    Wt (!) 305 lb 3.2 oz (138.4 kg)   SpO2 97%   BMI 39.19 kg/m   BP Readings from Last 3 Encounters:  07/28/22 134/82  02/24/22 133/80  12/08/21 138/84    Wt Readings from Last 3 Encounters:  07/28/22 (!) 305 lb 3.2 oz (138.4 kg)  02/24/22 (!) 304 lb 14.3 oz (138.3 kg)  12/08/21 (!) 305 lb (138.3 kg)    Physical Exam  Lab Results  Component Value Date   HGBA1C 6.8 (H) 09/07/2021   HGBA1C 7.5 (H) 03/20/2021   HGBA1C 6.3 09/16/2020    Lab Results  Component Value Date   CREATININE 0.77 02/24/2022   CREATININE 0.90 12/08/2021   CREATININE 0.87 09/07/2021    Lab Results  Component Value Date   WBC 8.1 02/24/2022   HGB 16.9 02/24/2022   HCT 50.7 02/24/2022   PLT 284 02/24/2022   GLUCOSE 213 (H) 02/24/2022   CHOL 131 09/07/2021   TRIG 50 09/07/2021   HDL 72 09/07/2021   LDLDIRECT 50.0 11/08/2016   LDLCALC 47 09/07/2021   ALT 43 12/08/2021   AST 27 12/08/2021   NA 133 (L) 02/24/2022   K 4.2 02/24/2022   CL 95 (L) 02/24/2022   CREATININE 0.77 02/24/2022   BUN 11 02/24/2022   CO2 25 02/24/2022   TSH 0.65 12/09/2021   PSA 0.61 09/07/2021   HGBA1C 6.8 (H) 09/07/2021   MICROALBUR <0.7 03/20/2021  DG Chest 2 View  Result Date: 02/24/2022 CLINICAL DATA:  Mid chest pain. EXAM: CHEST - 2 VIEW COMPARISON:  01/16/2013 FINDINGS: The cardiac silhouette, mediastinal and hilar contours are normal. The lungs are clear. No pleural effusions or pulmonary lesions. The bony thorax is intact. IMPRESSION: No acute cardiopulmonary findings. Electronically Signed   By: Rudie Meyer M.D.   On: 02/24/2022 10:09    Assessment & Plan:  .Type 2 diabetes mellitus with obesity (HCC)  Essential hypertension, benign  Hypothyroidism, iatrogenic  Hyperlipidemia, unspecified hyperlipidemia type     I provided 30 minutes of face-to-face time during this encounter reviewing patient's last visit with me, patient's  most recent visit with cardiology,  nephrology,  and neurology,   recent surgical and non surgical procedures, previous  labs and imaging studies, counseling on currently addressed issues,  and post visit ordering to diagnostics and therapeutics .   Follow-up: No follow-ups on file.   Sherlene Shams, MD

## 2022-07-29 ENCOUNTER — Encounter: Payer: Self-pay | Admitting: Internal Medicine

## 2022-07-29 ENCOUNTER — Other Ambulatory Visit: Payer: Self-pay

## 2022-07-29 LAB — COMPREHENSIVE METABOLIC PANEL
ALT: 55 U/L — ABNORMAL HIGH (ref 0–53)
AST: 40 U/L — ABNORMAL HIGH (ref 0–37)
Albumin: 4.2 g/dL (ref 3.5–5.2)
Alkaline Phosphatase: 78 U/L (ref 39–117)
BUN: 10 mg/dL (ref 6–23)
CO2: 22 mEq/L (ref 19–32)
Calcium: 9.8 mg/dL (ref 8.4–10.5)
Chloride: 98 mEq/L (ref 96–112)
Creatinine, Ser: 0.74 mg/dL (ref 0.40–1.50)
GFR: 104.61 mL/min (ref >60.00)
Glucose, Bld: 243 mg/dL — ABNORMAL HIGH (ref 70–99)
Potassium: 4.1 mEq/L (ref 3.5–5.1)
Sodium: 133 mEq/L — ABNORMAL LOW (ref 135–145)
Total Bilirubin: 1 mg/dL (ref 0.2–1.2)
Total Protein: 7.3 g/dL (ref 6.0–8.3)

## 2022-07-29 LAB — CBC WITH DIFFERENTIAL/PLATELET
Basophils Absolute: 0.1 10*3/uL (ref 0.0–0.1)
Basophils Relative: 0.7 % (ref 0.0–3.0)
Eosinophils Absolute: 0 10*3/uL (ref 0.0–0.7)
Eosinophils Relative: 0.6 % (ref 0.0–5.0)
HCT: 50 % (ref 39.0–52.0)
Hemoglobin: 17.1 g/dL — ABNORMAL HIGH (ref 13.0–17.0)
Lymphocytes Relative: 26.5 % (ref 12.0–46.0)
Lymphs Abs: 1.8 10*3/uL (ref 0.7–4.0)
MCHC: 34.2 g/dL (ref 30.0–36.0)
MCV: 91.7 fl (ref 78.0–100.0)
Monocytes Absolute: 0.6 10*3/uL (ref 0.1–1.0)
Monocytes Relative: 8.7 % (ref 3.0–12.0)
Neutro Abs: 4.4 10*3/uL (ref 1.4–7.7)
Neutrophils Relative %: 63.5 % (ref 43.0–77.0)
Platelets: 279 10*3/uL (ref 150.0–400.0)
RBC: 5.45 Mil/uL (ref 4.22–5.81)
RDW: 13.8 % (ref 11.5–15.5)
WBC: 7 10*3/uL (ref 4.0–10.5)

## 2022-07-29 LAB — LIPID PANEL
Cholesterol: 110 mg/dL (ref 0–200)
HDL: 55.1 mg/dL (ref >39.00)
LDL Cholesterol: 44 mg/dL (ref 0–99)
NonHDL: 54.52
Total CHOL/HDL Ratio: 2
Triglycerides: 52 mg/dL (ref 0.0–149.0)
VLDL: 10.4 mg/dL (ref 0.0–40.0)

## 2022-07-29 LAB — MICROALBUMIN / CREATININE URINE RATIO
Creatinine,U: 66.6 mg/dL
Microalb Creat Ratio: 1.1 mg/g (ref 0.0–30.0)
Microalb, Ur: <0.7 mg/dL (ref 0.0–1.9)

## 2022-07-29 LAB — TSH: TSH: 0.55 u[IU]/mL (ref 0.35–5.50)

## 2022-07-29 LAB — LDL CHOLESTEROL, DIRECT: Direct LDL: 48 mg/dL

## 2022-07-29 LAB — HEMOGLOBIN A1C: Hgb A1c MFr Bld: 9.9 % — ABNORMAL HIGH (ref 4.6–6.5)

## 2022-07-29 MED ORDER — INSULIN LISPRO (1 UNIT DIAL) 100 UNIT/ML (KWIKPEN)
5.0000 [IU] | PEN_INJECTOR | Freq: Three times a day (TID) | SUBCUTANEOUS | 11 refills | Status: DC
  Filled 2022-07-29: qty 10, 67d supply, fill #0
  Filled 2022-07-30: qty 15, 90d supply, fill #0
  Filled 2022-09-16: qty 15, 90d supply, fill #1

## 2022-07-29 MED ORDER — FREESTYLE LIBRE 3 SENSOR MISC
1.0000 | 11 refills | Status: DC
  Filled 2022-07-29: qty 2, 28d supply, fill #0
  Filled 2022-08-16: qty 2, 28d supply, fill #1
  Filled 2022-09-14: qty 2, 28d supply, fill #2
  Filled 2022-10-11: qty 2, 28d supply, fill #3
  Filled 2022-11-08: qty 2, 28d supply, fill #4
  Filled 2022-12-01: qty 2, 28d supply, fill #5
  Filled 2022-12-26: qty 2, 28d supply, fill #6
  Filled 2023-01-21: qty 2, 28d supply, fill #7
  Filled 2023-02-14: qty 2, 28d supply, fill #8
  Filled 2023-03-15: qty 2, 28d supply, fill #9
  Filled 2023-04-10: qty 2, 28d supply, fill #10
  Filled 2023-04-28 – 2023-05-09 (×3): qty 2, 28d supply, fill #11

## 2022-07-29 NOTE — Assessment & Plan Note (Signed)
Liver enzymes are elevated due to uncontrolled DM and weight gain.

## 2022-07-29 NOTE — Assessment & Plan Note (Addendum)
Managed with 275 mcg levothyroxine TSH is  < 1 but he denies palpitations andresting pulse is 84. No changes today. .  Repeat in 6months  Lab Results  Component Value Date   TSH 0.55 07/28/2022

## 2022-07-29 NOTE — Assessment & Plan Note (Signed)
He ihas lost  weight  Without pharmacotherapy and prefers to do so again..  he is not a candidate for GLP 1 agonist.

## 2022-07-29 NOTE — Assessment & Plan Note (Signed)
Uncontrolled on 20 units of Tresiba .  Will add humalog at mealtime , doses to be determined once ata can be reviewed from  [facility] monitor which is being ordered today

## 2022-07-29 NOTE — Assessment & Plan Note (Signed)
Recurrent  with presyncoep and chest pain. ER evaluation reviewed:  SVT, troponins negative x 2  ZIO monitor ordered

## 2022-07-29 NOTE — Assessment & Plan Note (Signed)
THe has a historr of atypical chest pain with remote admission to [facility] and nonvasive workup .  Current episodes have been accompanied by vertigo.  January episode evaluated in ER with troponins and EKG .  Need to rule out Arrhythmia and new onset CAD.  ZIO monitor and referral to Dwayne [facility]wood advised.  I have ordered and reviewed a 12 lead EKG and find that there are no acute changes and patient is in sinus rhythm.

## 2022-07-30 ENCOUNTER — Other Ambulatory Visit: Payer: Self-pay

## 2022-08-03 ENCOUNTER — Encounter: Payer: Self-pay | Admitting: Internal Medicine

## 2022-08-06 DIAGNOSIS — R55 Syncope and collapse: Secondary | ICD-10-CM | POA: Diagnosis not present

## 2022-08-06 DIAGNOSIS — R42 Dizziness and giddiness: Secondary | ICD-10-CM | POA: Diagnosis not present

## 2022-08-09 DIAGNOSIS — E119 Type 2 diabetes mellitus without complications: Secondary | ICD-10-CM | POA: Diagnosis not present

## 2022-08-09 DIAGNOSIS — E669 Obesity, unspecified: Secondary | ICD-10-CM | POA: Diagnosis not present

## 2022-08-09 DIAGNOSIS — Z794 Long term (current) use of insulin: Secondary | ICD-10-CM | POA: Diagnosis not present

## 2022-08-09 DIAGNOSIS — R42 Dizziness and giddiness: Secondary | ICD-10-CM | POA: Diagnosis not present

## 2022-08-09 DIAGNOSIS — R000 Tachycardia, unspecified: Secondary | ICD-10-CM | POA: Diagnosis not present

## 2022-08-09 DIAGNOSIS — R0789 Other chest pain: Secondary | ICD-10-CM | POA: Diagnosis not present

## 2022-08-09 DIAGNOSIS — R0602 Shortness of breath: Secondary | ICD-10-CM | POA: Diagnosis not present

## 2022-08-10 ENCOUNTER — Other Ambulatory Visit: Payer: Self-pay | Admitting: Internal Medicine

## 2022-08-10 DIAGNOSIS — R0789 Other chest pain: Secondary | ICD-10-CM

## 2022-08-16 ENCOUNTER — Other Ambulatory Visit: Payer: Self-pay

## 2022-08-16 MED FILL — Fluticasone Propionate Nasal Susp 50 MCG/ACT: NASAL | 30 days supply | Qty: 16 | Fill #2 | Status: AC

## 2022-08-16 MED FILL — Insulin Pen Needle 31 G X 8 MM (1/3" or 5/16"): 90 days supply | Qty: 100 | Fill #1 | Status: CN

## 2022-08-17 ENCOUNTER — Ambulatory Visit
Admission: RE | Admit: 2022-08-17 | Discharge: 2022-08-17 | Disposition: A | Discharge status: Home / Self Care | Payer: 59 | Source: Ambulatory Visit | Attending: Internal Medicine | Admitting: Internal Medicine

## 2022-08-17 ENCOUNTER — Other Ambulatory Visit: Payer: Self-pay

## 2022-08-17 DIAGNOSIS — R0789 Other chest pain: Secondary | ICD-10-CM | POA: Insufficient documentation

## 2022-08-17 MED FILL — Insulin Pen Needle 31 G X 8 MM (1/3" or 5/16"): 25 days supply | Qty: 100 | Fill #1 | Status: AC

## 2022-08-23 ENCOUNTER — Other Ambulatory Visit: Payer: Self-pay

## 2022-08-25 DIAGNOSIS — R55 Syncope and collapse: Secondary | ICD-10-CM | POA: Diagnosis not present

## 2022-08-25 DIAGNOSIS — R42 Dizziness and giddiness: Secondary | ICD-10-CM | POA: Diagnosis not present

## 2022-08-30 ENCOUNTER — Other Ambulatory Visit: Payer: Self-pay

## 2022-09-07 ENCOUNTER — Other Ambulatory Visit: Payer: Self-pay

## 2022-09-07 ENCOUNTER — Ambulatory Visit (INDEPENDENT_AMBULATORY_CARE_PROVIDER_SITE_OTHER): Payer: 59 | Admitting: Internal Medicine

## 2022-09-07 ENCOUNTER — Encounter: Payer: Self-pay | Admitting: Internal Medicine

## 2022-09-07 DIAGNOSIS — E669 Obesity, unspecified: Secondary | ICD-10-CM | POA: Diagnosis not present

## 2022-09-07 DIAGNOSIS — R079 Chest pain, unspecified: Secondary | ICD-10-CM

## 2022-09-07 DIAGNOSIS — I152 Hypertension secondary to endocrine disorders: Secondary | ICD-10-CM

## 2022-09-07 DIAGNOSIS — E1169 Type 2 diabetes mellitus with other specified complication: Secondary | ICD-10-CM

## 2022-09-07 DIAGNOSIS — Z0000 Encounter for general adult medical examination without abnormal findings: Secondary | ICD-10-CM | POA: Diagnosis not present

## 2022-09-07 DIAGNOSIS — K760 Fatty (change of) liver, not elsewhere classified: Secondary | ICD-10-CM | POA: Diagnosis not present

## 2022-09-07 DIAGNOSIS — F419 Anxiety disorder, unspecified: Secondary | ICD-10-CM

## 2022-09-07 DIAGNOSIS — E032 Hypothyroidism due to medicaments and other exogenous substances: Secondary | ICD-10-CM | POA: Diagnosis not present

## 2022-09-07 DIAGNOSIS — K648 Other hemorrhoids: Secondary | ICD-10-CM | POA: Diagnosis not present

## 2022-09-07 DIAGNOSIS — Z125 Encounter for screening for malignant neoplasm of prostate: Secondary | ICD-10-CM | POA: Diagnosis not present

## 2022-09-07 DIAGNOSIS — E1159 Type 2 diabetes mellitus with other circulatory complications: Secondary | ICD-10-CM

## 2022-09-07 DIAGNOSIS — I10 Essential (primary) hypertension: Secondary | ICD-10-CM | POA: Diagnosis not present

## 2022-09-07 MED ORDER — BUSPIRONE HCL 10 MG PO TABS
10.0000 mg | ORAL_TABLET | Freq: Two times a day (BID) | ORAL | 2 refills | Status: DC
  Filled 2022-09-07: qty 60, 30d supply, fill #0
  Filled 2022-10-07: qty 60, 30d supply, fill #1
  Filled 2022-11-07: qty 60, 30d supply, fill #2

## 2022-09-07 MED ORDER — LOSARTAN POTASSIUM-HCTZ 100-25 MG PO TABS
1.0000 | ORAL_TABLET | Freq: Every day | ORAL | 1 refills | Status: DC
  Filled 2022-09-07: qty 90, 90d supply, fill #0
  Filled 2022-12-01: qty 90, 90d supply, fill #1

## 2022-09-07 MED ORDER — METOPROLOL SUCCINATE ER 25 MG PO TB24
25.0000 mg | ORAL_TABLET | Freq: Every day | ORAL | 3 refills | Status: DC
  Filled 2022-09-07: qty 90, 90d supply, fill #0
  Filled 2022-12-01: qty 90, 90d supply, fill #1
  Filled 2023-03-01: qty 90, 90d supply, fill #2
  Filled 2023-05-31: qty 90, 90d supply, fill #3

## 2022-09-07 NOTE — Assessment & Plan Note (Signed)
Work up in progress; ECHO and ETT ordered by [employer] [facility]	[place]

## 2022-09-07 NOTE — Progress Notes (Signed)
Patient ID: Steven Hoover, male    DOB: [birth year]  Age: 52 y.o. MRN: [id number]  The patient is here for annual preventive examination and management of other chronic and acute problems.   The risk factors are reflected in the social history.   The roster of all physicians providing medical care to patient - is listed in the Snapshot section of the chart.   Activities of daily living:  The patient is 100% independent in all ADLs: dressing, toileting, feeding as well as independent mobility   Home safety : The patient has smoke detectors in the home. They wear seatbelts.  There are no unsecured firearms at home. There is no violence in the home.    There is no risks for hepatitis, STDs or HIV. There is no   history of blood transfusion. They have no travel history to infectious disease endemic areas of the world.   The patient has seen their dentist in the last six month. They have seen their eye doctor in the last year. The patinet  denies slight hearing difficulty with regard to whispered voices and some television programs.  They have deferred audiologic testing in the last year.  They do not  have excessive sun exposure. Discussed the need for sun protection: hats, long sleeves and use of sunscreen if there is significant sun exposure.    Diet: the importance of a healthy diet is discussed. He hs revised his diet since he started wearing the continuous blood glucose monitor .   The benefits of regular aerobic exercise were discussed. The patient  exercises  3 to 5 days per week  for  60 minutes.    Depression screen: there are no signs or vegative symptoms of depression- irritability, change in appetite, anhedonia, sadness/tearfullness.   The following portions of the patient's history were reviewed and updated as appropriate: allergies, current medications, past family history, past medical history,  past surgical history, past social history  and problem list.   Visual acuity was not  assessed per patient preference since the patient has regular follow up with an  ophthalmologist. Hearing and body mass index were assessed and reviewed.    During the course of the visit the patient was educated and counseled about appropriate screening and preventive services including : fall prevention , diabetes screening, nutrition counseling, colorectal cancer screening, and recommended immunizations.    Chief Complaint:   1) chest pain, palpitations reported at last visit:    results of ZIO 14 day monitor were normal. except for a 22 sec run of SVT not appreciated by patient.  Saw Callwood in early July:  noninvasive testing ordered:  ECHO and ETT ordered :  the coronary  Calcium Score was  zero  .  Other tests are pending   2) T2DM:  using Lispro  10 units tid and tresiba 20 units daily .Marland Kitchen  3) HTN:  home readings 120/80 to 140/82    Review of Symptoms  Patient denies headache, fevers, malaise, unintentional weight loss, skin rash, eye pain, sinus congestion and sinus pain, sore throat, dysphagia,  hemoptysis , cough, dyspnea, wheezing, chest pain, palpitations, orthopnea, edema, abdominal pain, nausea, melena, diarrhea, constipation, flank pain, dysuria, hematuria, urinary  Frequency, nocturia, numbness, tingling, seizures,  Focal weakness, Loss of consciousness,  Tremor, insomnia, depression, anxiety, and suicidal ideation.    Physical Exam:  There were no vitals taken for this visit.   Physical Exam Vitals reviewed.  Constitutional:      General:  He is not in acute distress.    Appearance: Normal appearance. He is normal weight. He is not ill-appearing, toxic-appearing or diaphoretic.  HENT:     Head: Normocephalic.  Eyes:     General: No scleral icterus.       Right eye: No discharge.        Left eye: No discharge.     Conjunctiva/sclera: Conjunctivae normal.  Cardiovascular:     Rate and Rhythm: Normal rate and regular rhythm.     Heart sounds: Normal heart sounds.   Pulmonary:     Effort: Pulmonary effort is normal. No respiratory distress.     Breath sounds: Normal breath sounds.  Musculoskeletal:        General: Normal range of motion.     Cervical back: Normal range of motion.  Skin:    General: Skin is warm and dry.  Neurological:     General: No focal deficit present.     Mental Status: He is alert and oriented to person, place, and time. Mental status is at baseline.  Psychiatric:        Mood and Affect: Mood normal.        Behavior: Behavior normal.        Thought Content: Thought content normal.        Judgment: Judgment normal.   Assessment and Plan: There are no diagnoses linked to this encounter.  No follow-ups on file.  Sherlene Shams, MD

## 2022-09-07 NOTE — Assessment & Plan Note (Signed)

## 2022-09-07 NOTE — Assessment & Plan Note (Addendum)
Improved control  using lispro 10 units tid and Tresiba 20 units daily .    I have downloaded and reviewed the data from patient's continuous blood glucose monitor. Patient's  sugars have been  IN RANGE     % OF THE TIME,   BELOW RANGE 0   % of the time.  And ABOVE RANGE  22  % OF THE TIME .  Medication changes were made based on this review as follows: increase the dinnertime dose of lispro by  units for starchy meals.

## 2022-09-07 NOTE — Assessment & Plan Note (Signed)
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  He has reduced his use of alcohol and is requesting formal therapy to help manage his stressors.

## 2022-09-07 NOTE — Assessment & Plan Note (Signed)
Improved with losartan 100 mg dose, but not at goal.  Adding metoprolol succinate 25 mg daily

## 2022-09-07 NOTE — Patient Instructions (Addendum)
Add 5 more units of Lispro to pre dinner dose of insulin, if there are any starches in meal   Otherwise you are under AWESOME  control  BP goal is 130/80 or less .  Adding metoprolol at night, and consolidating losartan/hct into one tablet for morning use.   Buspirone 10 mg once or twice daily for anxiety.    Your Tetanus vaccine is due in November.  You can get it with your labs if you would like

## 2022-09-07 NOTE — Assessment & Plan Note (Signed)
Uncontrolled type 2 DM by last A1c in June . I have downloaded and reviewed the data from patient's continuous blood glucose monitor. Patient's  sugars have been  IN RANGE     % OF THE TIME,   BELOW RANGE    % of the time.  And ABOVE RANGE   % OF THE TIME .  Medication changes were made based on this review as follows:   Lab Results  Component Value Date   HGBA1C 9.9 (H) 07/28/2022

## 2022-09-10 MED FILL — Insulin Pen Needle 31 G X 8 MM (1/3" or 5/16"): 25 days supply | Qty: 100 | Fill #2 | Status: AC

## 2022-09-14 ENCOUNTER — Other Ambulatory Visit: Payer: Self-pay

## 2022-09-16 ENCOUNTER — Other Ambulatory Visit: Payer: Self-pay

## 2022-09-17 ENCOUNTER — Other Ambulatory Visit: Payer: Self-pay

## 2022-09-17 ENCOUNTER — Telehealth: Payer: Self-pay | Admitting: Internal Medicine

## 2022-09-17 MED ORDER — INSULIN LISPRO (1 UNIT DIAL) 100 UNIT/ML (KWIKPEN)
10.0000 [IU] | PEN_INJECTOR | Freq: Three times a day (TID) | SUBCUTANEOUS | 11 refills | Status: DC
  Filled 2022-09-17 – 2022-10-20 (×5): qty 15, 50d supply, fill #0
  Filled 2022-12-26: qty 15, 50d supply, fill #1
  Filled 2023-02-14 – 2023-02-15 (×3): qty 15, 50d supply, fill #2
  Filled 2023-04-10: qty 15, 50d supply, fill #3
  Filled 2023-05-31: qty 15, 50d supply, fill #4
  Filled 2023-07-12: qty 15, 50d supply, fill #5
  Filled 2023-08-20: qty 15, 50d supply, fill #6

## 2022-09-17 NOTE — Telephone Encounter (Signed)
Medication has been refilled.

## 2022-09-17 NOTE — Telephone Encounter (Signed)
Pt called for refill on insulin lispro (HUMALOG) 100 UNIT/ML KwikPen. However, he stated that provider has increase the units to 10.  Prescription Request  09/17/2022  LOV: 09/07/2022  What is the name of the medication or equipment? insulin lispro (HUMALOG) 100 UNIT/ML KwikPen  Have you contacted your pharmacy to request a refill? Yes   Which pharmacy would you like this sent to?   [facility] - [facility] [address] [place] [address] Phone: [phone] Fax: [phone]  Patient notified that their request is being sent to the clinical staff for review and that they should receive a response within 2 business days.   Please advise at [facility] [phone]

## 2022-09-21 ENCOUNTER — Other Ambulatory Visit: Payer: Self-pay

## 2022-10-07 ENCOUNTER — Other Ambulatory Visit (HOSPITAL_COMMUNITY): Payer: Self-pay

## 2022-10-07 ENCOUNTER — Other Ambulatory Visit: Payer: Self-pay

## 2022-10-07 ENCOUNTER — Other Ambulatory Visit: Payer: Self-pay | Admitting: Internal Medicine

## 2022-10-07 MED ORDER — FLUTICASONE PROPIONATE 50 MCG/ACT NA SUSP
2.0000 | Freq: Every day | NASAL | 0 refills | Status: DC
  Filled 2022-10-07: qty 16, 30d supply, fill #0

## 2022-10-08 ENCOUNTER — Other Ambulatory Visit: Payer: Self-pay

## 2022-10-13 ENCOUNTER — Other Ambulatory Visit (HOSPITAL_COMMUNITY): Payer: Self-pay

## 2022-10-18 NOTE — Addendum Note (Signed)
Addended by: Sandy Salaam on: 10/18/2022 01:31 PM   Modules accepted: Orders

## 2022-10-20 ENCOUNTER — Other Ambulatory Visit: Payer: Self-pay

## 2022-10-20 ENCOUNTER — Other Ambulatory Visit (HOSPITAL_COMMUNITY): Payer: Self-pay

## 2022-10-26 ENCOUNTER — Other Ambulatory Visit: Payer: 59

## 2022-10-26 DIAGNOSIS — F4323 Adjustment disorder with mixed anxiety and depressed mood: Secondary | ICD-10-CM | POA: Diagnosis not present

## 2022-10-27 ENCOUNTER — Other Ambulatory Visit (INDEPENDENT_AMBULATORY_CARE_PROVIDER_SITE_OTHER): Payer: 59

## 2022-10-27 DIAGNOSIS — I152 Hypertension secondary to endocrine disorders: Secondary | ICD-10-CM | POA: Diagnosis not present

## 2022-10-27 DIAGNOSIS — E1169 Type 2 diabetes mellitus with other specified complication: Secondary | ICD-10-CM | POA: Diagnosis not present

## 2022-10-27 DIAGNOSIS — Z125 Encounter for screening for malignant neoplasm of prostate: Secondary | ICD-10-CM | POA: Diagnosis not present

## 2022-10-27 DIAGNOSIS — E669 Obesity, unspecified: Secondary | ICD-10-CM | POA: Diagnosis not present

## 2022-10-27 DIAGNOSIS — E1159 Type 2 diabetes mellitus with other circulatory complications: Secondary | ICD-10-CM

## 2022-10-27 DIAGNOSIS — K760 Fatty (change of) liver, not elsewhere classified: Secondary | ICD-10-CM

## 2022-10-27 LAB — COMPREHENSIVE METABOLIC PANEL
ALT: 47 U/L (ref 0–53)
AST: 35 U/L (ref 0–37)
Albumin: 4.2 g/dL (ref 3.5–5.2)
Alkaline Phosphatase: 68 U/L (ref 39–117)
BUN: 9 mg/dL (ref 6–23)
CO2: 30 mEq/L (ref 19–32)
Calcium: 9.9 mg/dL (ref 8.4–10.5)
Chloride: 97 mEq/L (ref 96–112)
Creatinine, Ser: 0.91 mg/dL (ref 0.40–1.50)
GFR: 97.14 mL/min (ref >60.00)
Glucose, Bld: 139 mg/dL — ABNORMAL HIGH (ref 70–99)
Potassium: 4.5 mEq/L (ref 3.5–5.1)
Sodium: 136 mEq/L (ref 135–145)
Total Bilirubin: 0.9 mg/dL (ref 0.2–1.2)
Total Protein: 7 g/dL (ref 6.0–8.3)

## 2022-10-27 LAB — HEMOGLOBIN A1C: Hgb A1c MFr Bld: 6.9 % — ABNORMAL HIGH (ref 4.6–6.5)

## 2022-10-27 LAB — PSA: PSA: 0.99 ng/mL (ref 0.10–4.00)

## 2022-10-28 LAB — LIPID PANEL W/REFLEX DIRECT LDL
Cholesterol: 127 mg/dL (ref <200)
HDL: 67 mg/dL (ref >=40)
LDL Cholesterol (Calc): 40 mg/dL (calc)
Non-HDL Cholesterol (Calc): 60 mg/dL (calc) (ref <130)
Total CHOL/HDL Ratio: 1.9 (calc) (ref <5.0)
Triglycerides: 114 mg/dL (ref <150)

## 2022-11-02 DIAGNOSIS — F4323 Adjustment disorder with mixed anxiety and depressed mood: Secondary | ICD-10-CM | POA: Diagnosis not present

## 2022-11-07 ENCOUNTER — Other Ambulatory Visit: Payer: Self-pay | Admitting: Internal Medicine

## 2022-11-07 ENCOUNTER — Other Ambulatory Visit: Payer: Self-pay

## 2022-11-07 MED FILL — Insulin Pen Needle 31 G X 8 MM (1/3" or 5/16"): 25 days supply | Qty: 100 | Fill #3 | Status: AC

## 2022-11-08 ENCOUNTER — Other Ambulatory Visit: Payer: Self-pay

## 2022-11-08 MED FILL — Fluticasone Propionate Nasal Susp 50 MCG/ACT: NASAL | 30 days supply | Qty: 16 | Fill #0 | Status: AC

## 2022-11-09 ENCOUNTER — Other Ambulatory Visit: Payer: Self-pay

## 2022-11-09 DIAGNOSIS — F4323 Adjustment disorder with mixed anxiety and depressed mood: Secondary | ICD-10-CM | POA: Diagnosis not present

## 2022-11-16 DIAGNOSIS — F4323 Adjustment disorder with mixed anxiety and depressed mood: Secondary | ICD-10-CM | POA: Diagnosis not present

## 2022-11-20 ENCOUNTER — Other Ambulatory Visit: Payer: Self-pay

## 2022-11-23 DIAGNOSIS — F4323 Adjustment disorder with mixed anxiety and depressed mood: Secondary | ICD-10-CM | POA: Diagnosis not present

## 2022-11-25 ENCOUNTER — Other Ambulatory Visit: Payer: Self-pay

## 2022-12-02 ENCOUNTER — Other Ambulatory Visit: Payer: Self-pay

## 2022-12-07 DIAGNOSIS — F4323 Adjustment disorder with mixed anxiety and depressed mood: Secondary | ICD-10-CM | POA: Diagnosis not present

## 2022-12-13 ENCOUNTER — Encounter: Payer: Self-pay | Admitting: Internal Medicine

## 2022-12-13 ENCOUNTER — Ambulatory Visit (INDEPENDENT_AMBULATORY_CARE_PROVIDER_SITE_OTHER): Payer: 59

## 2022-12-13 DIAGNOSIS — Z23 Encounter for immunization: Secondary | ICD-10-CM

## 2022-12-13 NOTE — Progress Notes (Signed)
Pt presented today for TDAP vaccine. Pt was identified through two identifiers. Pt tolerated injection well in the right deltoid.

## 2022-12-21 DIAGNOSIS — F4323 Adjustment disorder with mixed anxiety and depressed mood: Secondary | ICD-10-CM | POA: Diagnosis not present

## 2022-12-26 ENCOUNTER — Other Ambulatory Visit: Payer: Self-pay | Admitting: Internal Medicine

## 2022-12-27 ENCOUNTER — Other Ambulatory Visit: Payer: Self-pay

## 2022-12-27 MED ORDER — TECHLITE PEN NEEDLES 31G X 8 MM MISC
3 refills | Status: DC
  Filled 2022-12-27: qty 100, 25d supply, fill #0
  Filled 2023-01-20: qty 100, 25d supply, fill #1
  Filled 2023-02-14: qty 100, 25d supply, fill #2
  Filled 2023-03-15: qty 100, 25d supply, fill #3

## 2022-12-27 MED ORDER — FLUTICASONE PROPIONATE 50 MCG/ACT NA SUSP
2.0000 | Freq: Every day | NASAL | 0 refills | Status: DC
  Filled 2022-12-27: qty 16, 30d supply, fill #0

## 2023-01-11 DIAGNOSIS — F4323 Adjustment disorder with mixed anxiety and depressed mood: Secondary | ICD-10-CM | POA: Diagnosis not present

## 2023-01-18 DIAGNOSIS — F4323 Adjustment disorder with mixed anxiety and depressed mood: Secondary | ICD-10-CM | POA: Diagnosis not present

## 2023-02-08 DIAGNOSIS — F4323 Adjustment disorder with mixed anxiety and depressed mood: Secondary | ICD-10-CM | POA: Diagnosis not present

## 2023-02-15 ENCOUNTER — Other Ambulatory Visit: Payer: Self-pay

## 2023-03-01 ENCOUNTER — Other Ambulatory Visit (HOSPITAL_COMMUNITY): Payer: Self-pay

## 2023-03-01 ENCOUNTER — Other Ambulatory Visit: Payer: Self-pay

## 2023-03-01 ENCOUNTER — Other Ambulatory Visit: Payer: Self-pay | Admitting: Internal Medicine

## 2023-03-01 MED ORDER — FLUTICASONE PROPIONATE 50 MCG/ACT NA SUSP
2.0000 | Freq: Every day | NASAL | 0 refills | Status: DC
  Filled 2023-03-01: qty 16, 30d supply, fill #0

## 2023-03-01 MED ORDER — LOSARTAN POTASSIUM-HCTZ 100-25 MG PO TABS
1.0000 | ORAL_TABLET | Freq: Every day | ORAL | 1 refills | Status: DC
  Filled 2023-03-01: qty 90, 90d supply, fill #0
  Filled 2023-05-31: qty 90, 90d supply, fill #1

## 2023-04-10 ENCOUNTER — Other Ambulatory Visit: Payer: Self-pay | Admitting: Internal Medicine

## 2023-04-11 ENCOUNTER — Other Ambulatory Visit: Payer: Self-pay | Admitting: Internal Medicine

## 2023-04-11 ENCOUNTER — Other Ambulatory Visit: Payer: Self-pay

## 2023-04-12 ENCOUNTER — Other Ambulatory Visit: Payer: Self-pay

## 2023-04-12 MED FILL — Fluticasone Propionate Nasal Susp 50 MCG/ACT: NASAL | 30 days supply | Qty: 16 | Fill #0 | Status: AC

## 2023-04-28 ENCOUNTER — Other Ambulatory Visit: Payer: Self-pay | Admitting: Internal Medicine

## 2023-04-29 ENCOUNTER — Other Ambulatory Visit: Payer: Self-pay

## 2023-05-02 ENCOUNTER — Other Ambulatory Visit (HOSPITAL_COMMUNITY): Payer: Self-pay

## 2023-05-02 ENCOUNTER — Other Ambulatory Visit: Payer: Self-pay

## 2023-05-02 ENCOUNTER — Other Ambulatory Visit: Payer: Self-pay | Admitting: Internal Medicine

## 2023-05-02 MED ORDER — BUSPIRONE HCL 10 MG PO TABS
10.0000 mg | ORAL_TABLET | Freq: Two times a day (BID) | ORAL | 2 refills | Status: DC
  Filled 2023-05-02: qty 60, 30d supply, fill #0
  Filled 2023-05-31: qty 60, 30d supply, fill #1
  Filled 2023-06-30: qty 60, 30d supply, fill #2

## 2023-05-03 ENCOUNTER — Other Ambulatory Visit: Payer: Self-pay

## 2023-05-03 MED FILL — Insulin Degludec Soln Pen-Injector 100 Unit/ML: SUBCUTANEOUS | 75 days supply | Qty: 15 | Fill #0 | Status: AC

## 2023-05-09 ENCOUNTER — Other Ambulatory Visit: Payer: Self-pay

## 2023-05-16 ENCOUNTER — Other Ambulatory Visit: Payer: Self-pay | Admitting: Internal Medicine

## 2023-05-17 ENCOUNTER — Other Ambulatory Visit (HOSPITAL_COMMUNITY): Payer: Self-pay

## 2023-05-17 MED ORDER — INSUPEN PEN NEEDLES 31G X 8 MM MISC
3 refills | Status: DC
  Filled 2023-05-17: qty 100, 25d supply, fill #0
  Filled 2023-06-07: qty 100, 25d supply, fill #1
  Filled 2023-06-30: qty 100, 25d supply, fill #2
  Filled 2023-07-27: qty 100, 25d supply, fill #3

## 2023-05-17 MED ORDER — FLUTICASONE PROPIONATE 50 MCG/ACT NA SUSP
2.0000 | Freq: Every day | NASAL | 0 refills | Status: DC
  Filled 2023-05-17: qty 16, 30d supply, fill #0

## 2023-06-07 ENCOUNTER — Other Ambulatory Visit: Payer: Self-pay | Admitting: Internal Medicine

## 2023-06-07 ENCOUNTER — Other Ambulatory Visit: Payer: Self-pay

## 2023-06-08 ENCOUNTER — Other Ambulatory Visit: Payer: Self-pay

## 2023-06-08 MED FILL — Continuous Glucose System Sensor: 28 days supply | Qty: 2 | Fill #0 | Status: AC

## 2023-06-30 ENCOUNTER — Other Ambulatory Visit: Payer: Self-pay | Admitting: Internal Medicine

## 2023-06-30 ENCOUNTER — Other Ambulatory Visit: Payer: Self-pay

## 2023-06-30 MED FILL — Continuous Glucose System Sensor: 28 days supply | Qty: 2 | Fill #1 | Status: CN

## 2023-07-01 ENCOUNTER — Other Ambulatory Visit: Payer: Self-pay

## 2023-07-01 MED ORDER — FLUTICASONE PROPIONATE 50 MCG/ACT NA SUSP
2.0000 | Freq: Every day | NASAL | 0 refills | Status: DC
  Filled 2023-07-01: qty 16, 30d supply, fill #0

## 2023-07-04 MED FILL — Continuous Glucose System Sensor: 28 days supply | Qty: 2 | Fill #1 | Status: AC

## 2023-07-12 ENCOUNTER — Other Ambulatory Visit: Payer: Self-pay

## 2023-07-12 MED FILL — Insulin Degludec Soln Pen-Injector 100 Unit/ML: SUBCUTANEOUS | 75 days supply | Qty: 15 | Fill #1 | Status: AC

## 2023-07-22 DIAGNOSIS — E119 Type 2 diabetes mellitus without complications: Secondary | ICD-10-CM | POA: Diagnosis not present

## 2023-07-22 DIAGNOSIS — M3501 Sicca syndrome with keratoconjunctivitis: Secondary | ICD-10-CM | POA: Diagnosis not present

## 2023-07-22 LAB — HM DIABETES EYE EXAM

## 2023-07-25 ENCOUNTER — Other Ambulatory Visit: Payer: Self-pay

## 2023-07-27 ENCOUNTER — Other Ambulatory Visit: Payer: Self-pay

## 2023-07-28 ENCOUNTER — Other Ambulatory Visit: Payer: Self-pay

## 2023-07-28 ENCOUNTER — Other Ambulatory Visit: Payer: Self-pay | Admitting: Internal Medicine

## 2023-07-28 MED FILL — Fluticasone Propionate Nasal Susp 50 MCG/ACT: NASAL | 30 days supply | Qty: 16 | Fill #0 | Status: AC

## 2023-07-30 MED FILL — Continuous Glucose System Sensor: 28 days supply | Qty: 2 | Fill #2 | Status: AC

## 2023-07-31 MED FILL — Insulin Degludec Soln Pen-Injector 100 Unit/ML: SUBCUTANEOUS | 75 days supply | Qty: 15 | Fill #2 | Status: CN

## 2023-08-01 ENCOUNTER — Other Ambulatory Visit: Payer: Self-pay

## 2023-08-02 ENCOUNTER — Other Ambulatory Visit: Payer: Self-pay

## 2023-08-10 ENCOUNTER — Other Ambulatory Visit: Payer: Self-pay | Admitting: Internal Medicine

## 2023-08-10 ENCOUNTER — Other Ambulatory Visit: Payer: Self-pay

## 2023-08-10 MED ORDER — LEVOTHYROXINE SODIUM 125 MCG PO TABS
125.0000 ug | ORAL_TABLET | Freq: Every day | ORAL | 0 refills | Status: DC
  Filled 2023-08-10: qty 30, 30d supply, fill #0

## 2023-08-21 ENCOUNTER — Other Ambulatory Visit: Payer: Self-pay

## 2023-08-29 ENCOUNTER — Other Ambulatory Visit: Payer: Self-pay | Admitting: Internal Medicine

## 2023-08-29 ENCOUNTER — Other Ambulatory Visit: Payer: Self-pay

## 2023-08-29 MED ORDER — FLUTICASONE PROPIONATE 50 MCG/ACT NA SUSP
2.0000 | Freq: Every day | NASAL | 0 refills | Status: DC
Start: 2023-08-29 — End: 2023-09-27
  Filled 2023-08-29: qty 16, 30d supply, fill #0

## 2023-08-29 MED ORDER — INSUPEN PEN NEEDLES 31G X 8 MM MISC
3 refills | Status: AC
  Filled 2023-08-29: qty 100, 25d supply, fill #0
  Filled 2023-09-20: qty 100, 25d supply, fill #1
  Filled 2023-10-18: qty 100, 25d supply, fill #2

## 2023-08-29 MED ORDER — LOSARTAN POTASSIUM-HCTZ 100-25 MG PO TABS
1.0000 | ORAL_TABLET | Freq: Every day | ORAL | 1 refills | Status: DC
  Filled 2023-08-29: qty 90, 90d supply, fill #0
  Filled 2023-11-27: qty 90, 90d supply, fill #1

## 2023-08-29 MED ORDER — METOPROLOL SUCCINATE ER 25 MG PO TB24
25.0000 mg | ORAL_TABLET | Freq: Every day | ORAL | 1 refills | Status: DC
  Filled 2023-08-29: qty 90, 90d supply, fill #0
  Filled 2023-11-27: qty 90, 90d supply, fill #1

## 2023-08-29 MED FILL — Continuous Glucose System Sensor: 28 days supply | Qty: 2 | Fill #3 | Status: AC

## 2023-09-02 ENCOUNTER — Encounter: Payer: 59 | Admitting: Internal Medicine

## 2023-09-05 ENCOUNTER — Other Ambulatory Visit: Payer: Self-pay | Admitting: Internal Medicine

## 2023-09-05 ENCOUNTER — Encounter: Payer: Self-pay | Admitting: Internal Medicine

## 2023-09-05 ENCOUNTER — Other Ambulatory Visit: Payer: Self-pay

## 2023-09-05 ENCOUNTER — Ambulatory Visit (INDEPENDENT_AMBULATORY_CARE_PROVIDER_SITE_OTHER): Admitting: Internal Medicine

## 2023-09-05 VITALS — BP 132/72 | HR 102 | Temp 98.1°F | Ht 74.0 in | Wt 309.2 lb

## 2023-09-05 DIAGNOSIS — E1169 Type 2 diabetes mellitus with other specified complication: Secondary | ICD-10-CM

## 2023-09-05 DIAGNOSIS — E669 Obesity, unspecified: Secondary | ICD-10-CM | POA: Diagnosis not present

## 2023-09-05 DIAGNOSIS — Z6841 Body Mass Index (BMI) 40.0 and over, adult: Secondary | ICD-10-CM

## 2023-09-05 DIAGNOSIS — Z0001 Encounter for general adult medical examination with abnormal findings: Secondary | ICD-10-CM

## 2023-09-05 DIAGNOSIS — E785 Hyperlipidemia, unspecified: Secondary | ICD-10-CM

## 2023-09-05 DIAGNOSIS — R5383 Other fatigue: Secondary | ICD-10-CM

## 2023-09-05 DIAGNOSIS — Z125 Encounter for screening for malignant neoplasm of prostate: Secondary | ICD-10-CM | POA: Diagnosis not present

## 2023-09-05 DIAGNOSIS — Z794 Long term (current) use of insulin: Secondary | ICD-10-CM | POA: Diagnosis not present

## 2023-09-05 DIAGNOSIS — E032 Hypothyroidism due to medicaments and other exogenous substances: Secondary | ICD-10-CM

## 2023-09-05 DIAGNOSIS — Z23 Encounter for immunization: Secondary | ICD-10-CM

## 2023-09-05 DIAGNOSIS — Z1211 Encounter for screening for malignant neoplasm of colon: Secondary | ICD-10-CM

## 2023-09-05 DIAGNOSIS — I152 Hypertension secondary to endocrine disorders: Secondary | ICD-10-CM | POA: Diagnosis not present

## 2023-09-05 DIAGNOSIS — E1159 Type 2 diabetes mellitus with other circulatory complications: Secondary | ICD-10-CM | POA: Diagnosis not present

## 2023-09-05 DIAGNOSIS — I10 Essential (primary) hypertension: Secondary | ICD-10-CM | POA: Diagnosis not present

## 2023-09-05 DIAGNOSIS — Z0000 Encounter for general adult medical examination without abnormal findings: Secondary | ICD-10-CM

## 2023-09-05 LAB — HEMOGLOBIN A1C: Hgb A1c MFr Bld: 7.3 % — ABNORMAL HIGH (ref 4.6–6.5)

## 2023-09-05 LAB — MICROALBUMIN / CREATININE URINE RATIO
Creatinine,U: 50.4 mg/dL
Microalb Creat Ratio: UNDETERMINED mg/g (ref 0.0–30.0)
Microalb, Ur: <0.7 mg/dL

## 2023-09-05 LAB — CBC WITH DIFFERENTIAL/PLATELET
Basophils Absolute: 0.1 K/uL (ref 0.0–0.1)
Basophils Relative: 1.1 % (ref 0.0–3.0)
Eosinophils Absolute: 0 K/uL (ref 0.0–0.7)
Eosinophils Relative: 0.4 % (ref 0.0–5.0)
HCT: 51 % (ref 39.0–52.0)
Hemoglobin: 17.2 g/dL — ABNORMAL HIGH (ref 13.0–17.0)
Lymphocytes Relative: 22.1 % (ref 12.0–46.0)
Lymphs Abs: 1.4 K/uL (ref 0.7–4.0)
MCHC: 33.7 g/dL (ref 30.0–36.0)
MCV: 93.4 fl (ref 78.0–100.0)
Monocytes Absolute: 0.6 K/uL (ref 0.1–1.0)
Monocytes Relative: 9.9 % (ref 3.0–12.0)
Neutro Abs: 4.3 K/uL (ref 1.4–7.7)
Neutrophils Relative %: 66.5 % (ref 43.0–77.0)
Platelets: 222 K/uL (ref 150.0–400.0)
RBC: 5.46 Mil/uL (ref 4.22–5.81)
RDW: 13.3 % (ref 11.5–15.5)
WBC: 6.5 K/uL (ref 4.0–10.5)

## 2023-09-05 LAB — COMPREHENSIVE METABOLIC PANEL WITH GFR
ALT: 43 U/L (ref 0–53)
AST: 39 U/L — ABNORMAL HIGH (ref 0–37)
Albumin: 4.3 g/dL (ref 3.5–5.2)
Alkaline Phosphatase: 67 U/L (ref 39–117)
BUN: 11 mg/dL (ref 6–23)
CO2: 22 meq/L (ref 19–32)
Calcium: 9.3 mg/dL (ref 8.4–10.5)
Chloride: 100 meq/L (ref 96–112)
Creatinine, Ser: 0.67 mg/dL (ref 0.40–1.50)
GFR: 106.97 mL/min (ref >60.00)
Glucose, Bld: 153 mg/dL — ABNORMAL HIGH (ref 70–99)
Potassium: 4 meq/L (ref 3.5–5.1)
Sodium: 135 meq/L (ref 135–145)
Total Bilirubin: 0.8 mg/dL (ref 0.2–1.2)
Total Protein: 7.3 g/dL (ref 6.0–8.3)

## 2023-09-05 LAB — PSA: PSA: 0.86 ng/mL (ref 0.10–4.00)

## 2023-09-05 LAB — LIPID PANEL
Cholesterol: 126 mg/dL (ref 0–200)
HDL: 72.1 mg/dL (ref >39.00)
LDL Cholesterol: 44 mg/dL (ref 0–99)
NonHDL: 54.33
Total CHOL/HDL Ratio: 2
Triglycerides: 50 mg/dL (ref 0.0–149.0)
VLDL: 10 mg/dL (ref 0.0–40.0)

## 2023-09-05 LAB — TSH: TSH: 0.13 u[IU]/mL — ABNORMAL LOW (ref 0.35–5.50)

## 2023-09-05 LAB — LDL CHOLESTEROL, DIRECT: Direct LDL: 48 mg/dL

## 2023-09-05 MED ORDER — FREESTYLE LIBRE 3 PLUS SENSOR MISC
11 refills | Status: AC
  Filled 2023-09-05 (×2): qty 2, 30d supply, fill #0
  Filled 2023-09-06: qty 2, 28d supply, fill #0
  Filled 2023-09-06 – 2023-09-21 (×4): qty 2, 30d supply, fill #0
  Filled 2023-10-18: qty 2, 30d supply, fill #1
  Filled 2023-11-27: qty 2, 30d supply, fill #2
  Filled 2023-12-25: qty 2, 30d supply, fill #3
  Filled 2024-01-22: qty 2, 30d supply, fill #4
  Filled 2024-02-22: qty 2, 30d supply, fill #5

## 2023-09-05 MED ORDER — BUSPIRONE HCL 10 MG PO TABS
10.0000 mg | ORAL_TABLET | Freq: Two times a day (BID) | ORAL | 2 refills | Status: DC
  Filled 2023-09-05: qty 60, 30d supply, fill #0
  Filled 2023-10-18: qty 60, 30d supply, fill #1
  Filled 2023-11-27: qty 60, 30d supply, fill #2

## 2023-09-05 MED ORDER — LEVOTHYROXINE SODIUM 150 MCG PO TABS
150.0000 ug | ORAL_TABLET | Freq: Every day | ORAL | 3 refills | Status: DC
  Filled 2023-09-05: qty 90, 90d supply, fill #0

## 2023-09-05 NOTE — Patient Instructions (Addendum)
 Referral to [facility] clinic for 2026 colonoscopy    I  have given you a 30 day sample of  Mounjaro.   Mounjaro is a GLP-1 receptor agonist.  It is a medication that is taken as a weekly subcutaneous injection. It is not insulin .  It  causes your pancreas to increase its  own insulin  secretion  And also slows down the emptying of your stomach,  So it decreases your appetite and helps you lose weight. For people with diabetes,  the decreased appetite results in lower blood sugars.   The dose for the first 4 weekly doses is 2.5 mg .  You may have mild nausea on the first or second day but this should resolve.  If not  ,  stop the medication.   As long as you are losing weight,  you can continue to request  the dose you are taking.  Onl  request an increase in the dose to  5.0 mg  after 4 weeks if your weight has plateaued.  Let me know when you need a refill and what dose you want filled

## 2023-09-05 NOTE — Assessment & Plan Note (Signed)
 He has lost  weight  Without pharmacotherapy but cannot maintain it.  He has history of papillary thyroid  cancer   but no  personal or family history of medullary thyroid  carcinoma or  multiple endocrine neoplasia syndrome type 2 , so unse of a  GLP 1 agonist therapy is NOT  contraindicated

## 2023-09-05 NOTE — Progress Notes (Unsigned)
 Patient ID: INIOLUWA BARIS, male    DOB: 08-15-1970  Age: 53 y.o. MRN: [id number]  The patient is here for annual preventive examination and management of other chronic and acute problems.   The risk factors are reflected in the social history.   The roster of all physicians providing medical care to patient - is listed in the Snapshot section of the chart.   Activities of daily living:  The patient is 100% independent in all ADLs: dressing, toileting, feeding as well as independent mobility   Home safety : The patient has smoke detectors in the home. They wear seatbelts.  There are no unsecured firearms at home. There is no violence in the home.    There is no risks for hepatitis, STDs or HIV. There is no   history of blood transfusion. They have no travel history to infectious disease endemic areas of the world.   The patient has seen their dentist in the last six month. They have seen their eye doctor in the last year. The patinet  denies slight hearing difficulty with regard to whispered voices and some television programs.  They have deferred audiologic testing in the last year.  They do not  have excessive sun exposure. Discussed the need for sun protection: hats, long sleeves and use of sunscreen if there is significant sun exposure.    Diet: the importance of a healthy diet is discussed. They do have a healthy diet.   The benefits of regular aerobic exercise were discussed. The patient  exercises  3 to 5 days per week  for  60 minutes.    Depression screen: there are no signs or vegative symptoms of depression- irritability, change in appetite, anhedonia, sadness/tearfullness.   The following portions of the patient's history were reviewed and updated as appropriate: allergies, current medications, past family history, past medical history,  past surgical history, past social history  and problem list.   Visual acuity was not assessed per patient preference since the patient has  regular follow up with an  ophthalmologist. Hearing and body mass index were assessed and reviewed.    During the course of the visit the patient was educated and counseled about appropriate screening and preventive services including : fall prevention , diabetes screening, nutrition counseling, colorectal cancer screening, and recommended immunizations.    Chief Complaint:  Obesity: nighttime snacking on potato chips   Type 2 DM: skips breakfast so has increased humalog  to 15 units bid  Review of Symptoms  Patient denies headache, fevers, malaise, unintentional weight loss, skin rash, eye pain, sinus congestion and sinus pain, sore throat, dysphagia,  hemoptysis , cough, dyspnea, wheezing, chest pain, palpitations, orthopnea, edema, abdominal pain, nausea, melena, diarrhea, constipation, flank pain, dysuria, hematuria, urinary  Frequency, nocturia, numbness, tingling, seizures,  Focal weakness, Loss of consciousness,  Tremor, insomnia, depression, anxiety, and suicidal ideation.    Physical Exam:  BP 132/72 (BP Location: Left Arm, Patient Position: Sitting, Cuff Size: Normal)   Pulse (!) 102   Temp 98.1 F (36.7 C) (Oral)   Ht 6' 2 (1.88 m)   Wt (!) 309 lb 3.2 oz (140.3 kg)   SpO2 99%   BMI 39.70 kg/m    Physical Exam  Assessment and Plan: Encounter for preventative adult health care examination  Obesity, diabetes, and hypertension syndrome (HCC)  Hypothyroidism, iatrogenic  Hyperlipidemia, unspecified hyperlipidemia type  Other fatigue  Morbid obesity with BMI of 40.0-44.9, adult [facility]) Assessment & Plan: He has lost  weight  Without pharmacotherapy but cannot maintain it.  He has history of papillary thyroid  cancer   but no  personal or family history of medullary thyroid  carcinoma or  multiple endocrine neoplasia syndrome type 2 , so unse of a  GLP 1 agonist therapy is NOT  contraindicated      No follow-ups on file.  Verneita LITTIE Kettering, MD

## 2023-09-06 ENCOUNTER — Other Ambulatory Visit: Payer: Self-pay

## 2023-09-06 ENCOUNTER — Ambulatory Visit: Payer: Self-pay | Admitting: Internal Medicine

## 2023-09-06 MED ORDER — LEVOTHYROXINE SODIUM 100 MCG PO TABS
100.0000 ug | ORAL_TABLET | Freq: Every day | ORAL | 0 refills | Status: DC
  Filled 2023-09-06: qty 90, 90d supply, fill #0

## 2023-09-06 NOTE — Assessment & Plan Note (Signed)

## 2023-09-06 NOTE — Assessment & Plan Note (Signed)
 Improved with losartan  100 mg dose ad metoprolol  succinate 25 mg daily

## 2023-09-06 NOTE — Assessment & Plan Note (Addendum)
 Managed with 250 mcg levothyroxine  .  TSH is still  < 1  and Free T4 elevated.   Will reduce dose to 225 mcg daily and repeat in 6 weeks  Lab Results  Component Value Date   TSH 0.73 10/19/2023

## 2023-09-06 NOTE — Assessment & Plan Note (Addendum)
 He has been uable to achieve weight loss and adequate glycemic control thus far. With Tresiba  20 units daily and lispro 15 units bid qac   He He has no contraindications to mounjaro (history of papillary thyroid  CA, NOT medullary) . Samples of mounjaro given of starting dose to ADD to current regimen  Lab Results  Component Value Date   HGBA1C 7.3 (H) 09/05/2023   Lab Results  Component Value Date   MICROALBUR <0.7 09/05/2023   MICROALBUR 0.3 12/15/2018

## 2023-09-07 ENCOUNTER — Other Ambulatory Visit: Payer: Self-pay

## 2023-09-11 ENCOUNTER — Other Ambulatory Visit: Payer: Self-pay

## 2023-09-20 ENCOUNTER — Other Ambulatory Visit: Payer: Self-pay

## 2023-09-21 ENCOUNTER — Other Ambulatory Visit: Payer: Self-pay

## 2023-09-25 ENCOUNTER — Other Ambulatory Visit: Payer: Self-pay | Admitting: Internal Medicine

## 2023-09-27 ENCOUNTER — Other Ambulatory Visit: Payer: Self-pay | Admitting: Internal Medicine

## 2023-09-28 ENCOUNTER — Other Ambulatory Visit: Payer: Self-pay

## 2023-09-28 ENCOUNTER — Other Ambulatory Visit: Payer: Self-pay | Admitting: Internal Medicine

## 2023-09-28 ENCOUNTER — Encounter: Payer: Self-pay | Admitting: Internal Medicine

## 2023-09-28 MED ORDER — FLUTICASONE PROPIONATE 50 MCG/ACT NA SUSP
2.0000 | Freq: Every day | NASAL | 0 refills | Status: DC
Start: 2023-09-28 — End: 2023-11-08
  Filled 2023-09-28: qty 16, 30d supply, fill #0

## 2023-09-28 NOTE — Telephone Encounter (Signed)
 Pt would like to know if he is supposed to stay on Tresiba  and Humalog  since he has started taking Mounjaro.

## 2023-09-28 NOTE — Telephone Encounter (Unsigned)
 Copied from CRM [id number]. Topic: Clinical - Medication Refill >> Sep 28, 2023 11:45 AM Viola F wrote: Medication: insulin  lispro (HUMALOG ) 100 UNIT/ML KwikPen [[id number]]  Has the patient contacted their pharmacy? Yes (Agent: If no, request that the patient contact the pharmacy for the refill. If patient does not wish to contact the pharmacy document the reason why and proceed with request.) (Agent: If yes, when and what did the pharmacy advise?)  This is the patient's preferred pharmacy:  [facility] - [facility] [address] [place] [address] Phone: [phone] Fax: [phone]  [facility] - [facility] [address] [place] [address] Phone: [phone] Fax: [phone]  Is this the correct pharmacy for this prescription? Yes If no, delete pharmacy and type the correct one.   Has the prescription been filled recently? Yes  Is the patient out of the medication? Yes  Has the patient been seen for an appointment in the last year OR does the patient have an upcoming appointment? Yes  Can we respond through MyChart? Yes  Agent: Please be advised that Rx refills may take up to 3 business days. We ask that you follow-up with your pharmacy.

## 2023-09-29 ENCOUNTER — Encounter: Payer: Self-pay | Admitting: Internal Medicine

## 2023-09-29 ENCOUNTER — Other Ambulatory Visit: Payer: Self-pay

## 2023-09-29 ENCOUNTER — Other Ambulatory Visit (HOSPITAL_COMMUNITY): Payer: Self-pay

## 2023-09-29 MED ORDER — INSULIN LISPRO (1 UNIT DIAL) 100 UNIT/ML (KWIKPEN)
10.0000 [IU] | PEN_INJECTOR | Freq: Three times a day (TID) | SUBCUTANEOUS | 11 refills | Status: AC
  Filled 2023-09-29 – 2023-09-30 (×3): qty 15, 50d supply, fill #0
  Filled 2024-01-22: qty 15, 50d supply, fill #1

## 2023-09-30 ENCOUNTER — Other Ambulatory Visit: Payer: Self-pay

## 2023-10-09 ENCOUNTER — Encounter: Payer: Self-pay | Admitting: Internal Medicine

## 2023-10-10 MED ORDER — TIRZEPATIDE 2.5 MG/0.5ML ~~LOC~~ SOAJ
2.5000 mg | SUBCUTANEOUS | 1 refills | Status: DC
  Filled 2023-10-10: qty 2, 28d supply, fill #0
  Filled 2023-11-07: qty 2, 28d supply, fill #1

## 2023-10-10 NOTE — Telephone Encounter (Signed)
 Pt requesting prescription for Mounjaro  received a sample at last office visit.

## 2023-10-11 ENCOUNTER — Other Ambulatory Visit: Payer: Self-pay

## 2023-10-19 ENCOUNTER — Other Ambulatory Visit (INDEPENDENT_AMBULATORY_CARE_PROVIDER_SITE_OTHER)

## 2023-10-19 DIAGNOSIS — E032 Hypothyroidism due to medicaments and other exogenous substances: Secondary | ICD-10-CM

## 2023-10-20 LAB — THYROID PANEL WITH TSH
Free Thyroxine Index: 3.5 (ref 1.4–3.8)
T3 Uptake: 30 % (ref 22–35)
T4, Total: 11.7 ug/dL — ABNORMAL HIGH (ref 4.9–10.5)
TSH: 0.73 m[IU]/L (ref 0.40–4.50)

## 2023-10-21 ENCOUNTER — Other Ambulatory Visit: Payer: Self-pay

## 2023-10-21 MED ORDER — LEVOTHYROXINE SODIUM 125 MCG PO TABS
125.0000 ug | ORAL_TABLET | Freq: Every day | ORAL | 0 refills | Status: DC
  Filled 2023-10-21: qty 90, 90d supply, fill #0

## 2023-11-07 ENCOUNTER — Other Ambulatory Visit: Payer: Self-pay | Admitting: Internal Medicine

## 2023-11-07 ENCOUNTER — Other Ambulatory Visit: Payer: Self-pay

## 2023-11-08 ENCOUNTER — Other Ambulatory Visit: Payer: Self-pay

## 2023-11-08 MED FILL — Fluticasone Propionate Nasal Susp 50 MCG/ACT: NASAL | 30 days supply | Qty: 16 | Fill #0 | Status: AC

## 2023-12-05 ENCOUNTER — Other Ambulatory Visit: Payer: Self-pay | Admitting: Internal Medicine

## 2023-12-05 ENCOUNTER — Other Ambulatory Visit: Payer: Self-pay | Admitting: Family

## 2023-12-05 ENCOUNTER — Other Ambulatory Visit: Payer: Self-pay

## 2023-12-05 DIAGNOSIS — E1169 Type 2 diabetes mellitus with other specified complication: Secondary | ICD-10-CM

## 2023-12-06 ENCOUNTER — Other Ambulatory Visit: Payer: Self-pay

## 2023-12-06 MED FILL — Levothyroxine Sodium Tab 100 MCG: ORAL | 90 days supply | Qty: 90 | Fill #0 | Status: AC

## 2023-12-06 MED FILL — Tirzepatide Soln Auto-injector 2.5 MG/0.5ML: SUBCUTANEOUS | 28 days supply | Qty: 2 | Fill #0 | Status: AC

## 2023-12-06 MED FILL — Fluticasone Propionate Nasal Susp 50 MCG/ACT: NASAL | 30 days supply | Qty: 16 | Fill #0 | Status: AC

## 2024-01-07 ENCOUNTER — Other Ambulatory Visit: Payer: Self-pay | Admitting: Internal Medicine

## 2024-01-07 MED FILL — Tirzepatide Soln Auto-injector 2.5 MG/0.5ML: SUBCUTANEOUS | 28 days supply | Qty: 2 | Fill #1 | Status: AC

## 2024-01-09 ENCOUNTER — Other Ambulatory Visit: Payer: Self-pay

## 2024-01-09 ENCOUNTER — Other Ambulatory Visit: Payer: Self-pay | Admitting: Internal Medicine

## 2024-01-09 MED FILL — Fluticasone Propionate Nasal Susp 50 MCG/ACT: NASAL | 30 days supply | Qty: 16 | Fill #0 | Status: AC

## 2024-01-22 ENCOUNTER — Other Ambulatory Visit: Payer: Self-pay | Admitting: Internal Medicine

## 2024-01-23 ENCOUNTER — Other Ambulatory Visit: Payer: Self-pay

## 2024-01-23 MED ORDER — LEVOTHYROXINE SODIUM 125 MCG PO TABS
125.0000 ug | ORAL_TABLET | Freq: Every day | ORAL | 0 refills | Status: AC
  Filled 2024-01-23: qty 90, 90d supply, fill #0

## 2024-02-04 ENCOUNTER — Other Ambulatory Visit: Payer: Self-pay

## 2024-02-04 MED FILL — Fluticasone Propionate Nasal Susp 50 MCG/ACT: NASAL | 30 days supply | Qty: 16 | Fill #1 | Status: AC

## 2024-02-05 ENCOUNTER — Other Ambulatory Visit: Payer: Self-pay | Admitting: Internal Medicine

## 2024-02-05 DIAGNOSIS — E1169 Type 2 diabetes mellitus with other specified complication: Secondary | ICD-10-CM

## 2024-02-08 ENCOUNTER — Other Ambulatory Visit (HOSPITAL_COMMUNITY): Payer: Self-pay

## 2024-02-08 ENCOUNTER — Other Ambulatory Visit: Payer: Self-pay

## 2024-02-08 MED ORDER — MOUNJARO 2.5 MG/0.5ML ~~LOC~~ SOAJ
2.5000 mg | SUBCUTANEOUS | 1 refills | Status: AC
  Filled 2024-02-08 – 2024-02-09 (×4): qty 2, 28d supply, fill #0
  Filled 2024-03-07: qty 2, 28d supply, fill #1

## 2024-02-09 ENCOUNTER — Other Ambulatory Visit (HOSPITAL_COMMUNITY): Payer: Self-pay

## 2024-02-09 ENCOUNTER — Other Ambulatory Visit: Payer: Self-pay

## 2024-02-11 ENCOUNTER — Other Ambulatory Visit: Payer: Self-pay

## 2024-02-13 ENCOUNTER — Other Ambulatory Visit: Payer: Self-pay

## 2024-02-13 MED ORDER — NA SULFATE-K SULFATE-MG SULF 17.5-3.13-1.6 GM/177ML PO SOLN
ORAL | 0 refills | Status: AC
  Filled 2024-02-13: qty 354, 1d supply, fill #0

## 2024-03-05 ENCOUNTER — Other Ambulatory Visit: Payer: Self-pay | Admitting: Internal Medicine

## 2024-03-06 ENCOUNTER — Other Ambulatory Visit: Payer: Self-pay

## 2024-03-06 ENCOUNTER — Other Ambulatory Visit: Payer: Self-pay | Admitting: Internal Medicine

## 2024-03-06 ENCOUNTER — Other Ambulatory Visit (HOSPITAL_COMMUNITY): Payer: Self-pay

## 2024-03-06 MED ORDER — FLUTICASONE PROPIONATE 50 MCG/ACT NA SUSP
2.0000 | Freq: Every day | NASAL | 1 refills | Status: DC
  Filled 2024-03-06: qty 16, 30d supply, fill #0

## 2024-03-06 MED ORDER — LOSARTAN POTASSIUM-HCTZ 100-25 MG PO TABS
1.0000 | ORAL_TABLET | Freq: Every day | ORAL | 1 refills | Status: AC
  Filled 2024-03-06 (×2): qty 90, 90d supply, fill #0

## 2024-03-06 MED ORDER — METOPROLOL SUCCINATE ER 25 MG PO TB24
25.0000 mg | ORAL_TABLET | Freq: Every day | ORAL | 1 refills | Status: AC
  Filled 2024-03-06 (×2): qty 90, 90d supply, fill #0

## 2024-03-06 MED ORDER — LEVOTHYROXINE SODIUM 100 MCG PO TABS
100.0000 ug | ORAL_TABLET | Freq: Every day | ORAL | 0 refills | Status: AC
  Filled 2024-03-06 (×2): qty 90, 90d supply, fill #0

## 2024-03-07 ENCOUNTER — Other Ambulatory Visit: Payer: Self-pay | Admitting: Internal Medicine

## 2024-03-07 ENCOUNTER — Other Ambulatory Visit: Payer: Self-pay

## 2024-03-08 ENCOUNTER — Other Ambulatory Visit: Payer: Self-pay

## 2024-03-08 ENCOUNTER — Ambulatory Visit: Admitting: Internal Medicine

## 2024-03-08 ENCOUNTER — Encounter: Payer: Self-pay | Admitting: Internal Medicine

## 2024-03-08 VITALS — BP 122/70 | HR 80 | Temp 97.6°F | Ht 74.0 in | Wt 292.2 lb

## 2024-03-08 DIAGNOSIS — E785 Hyperlipidemia, unspecified: Secondary | ICD-10-CM

## 2024-03-08 DIAGNOSIS — E032 Hypothyroidism due to medicaments and other exogenous substances: Secondary | ICD-10-CM

## 2024-03-08 DIAGNOSIS — E119 Type 2 diabetes mellitus without complications: Secondary | ICD-10-CM

## 2024-03-08 DIAGNOSIS — K760 Fatty (change of) liver, not elsewhere classified: Secondary | ICD-10-CM

## 2024-03-08 LAB — LIPID PANEL
Cholesterol: 128 mg/dL (ref 28–200)
HDL: 68.4 mg/dL
LDL Cholesterol: 47 mg/dL (ref 10–99)
NonHDL: 59.75
Total CHOL/HDL Ratio: 2
Triglycerides: 65 mg/dL (ref 10.0–149.0)
VLDL: 13 mg/dL (ref 0.0–40.0)

## 2024-03-08 LAB — COMPREHENSIVE METABOLIC PANEL WITH GFR
ALT: 36 U/L (ref 3–53)
AST: 36 U/L (ref 5–37)
Albumin: 4.3 g/dL (ref 3.5–5.2)
Alkaline Phosphatase: 65 U/L (ref 39–117)
BUN: 7 mg/dL (ref 6–23)
CO2: 32 meq/L (ref 19–32)
Calcium: 9.8 mg/dL (ref 8.4–10.5)
Chloride: 94 meq/L — ABNORMAL LOW (ref 96–112)
Creatinine, Ser: 0.74 mg/dL (ref 0.40–1.50)
GFR: 103.44 mL/min
Glucose, Bld: 164 mg/dL — ABNORMAL HIGH (ref 70–99)
Potassium: 4.2 meq/L (ref 3.5–5.1)
Sodium: 133 meq/L — ABNORMAL LOW (ref 135–145)
Total Bilirubin: 1.2 mg/dL (ref 0.2–1.2)
Total Protein: 7.3 g/dL (ref 6.0–8.3)

## 2024-03-08 LAB — MICROALBUMIN / CREATININE URINE RATIO
Creatinine,U: 40.4 mg/dL
Microalb Creat Ratio: UNDETERMINED mg/g (ref 0.0–30.0)
Microalb, Ur: <0.7 mg/dL

## 2024-03-08 LAB — HEMOGLOBIN A1C: Hgb A1c MFr Bld: 6.8 % — ABNORMAL HIGH (ref 4.6–6.5)

## 2024-03-08 LAB — LDL CHOLESTEROL, DIRECT: Direct LDL: 49 mg/dL

## 2024-03-08 MED FILL — Fluticasone Propionate Nasal Susp 50 MCG/ACT: NASAL | 30 days supply | Qty: 16 | Fill #0 | Status: CN

## 2024-03-08 NOTE — Progress Notes (Signed)
 "  Subjective:  Patient ID: Steven Hoover, male    DOB: [birth year]  Age: 54 y.o. MRN: [id number]  CC: The primary encounter diagnosis was Hypothyroidism, iatrogenic. Diagnoses of Hyperlipidemia, unspecified hyperlipidemia type, Obesity, diabetes, and hypertension syndrome (HCC), Fatty liver disease, nonalcoholic, and Morbid obesity with BMI of 40.0-44.9, adult [facility]) were also pertinent to this visit.   HPI Steven Hoover presents for  Chief Complaint  Patient presents with   Medical Management of Chronic Issues    6 month f/u    1) Type 2 DM:   He feels generally well, is not exercising regularly due to work schedule .  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  I have downloaded and reviewed the data from patient's continuous blood glucose monitor  for  the period  of   Jan 21 to Feb 5.    Patient's  sugars have been  IN RANGE   75  % OF THE TIME,   ABOVE RANGE  25  % of the time.   Patient's medications currently used for glycemic control are  20 units Tresiba  and 10 units lispro at each meal.  Patient reports compliance with medication approximately   98 % of the time.     Denies any recent hypoglyemic events.  Taking his medications as directed. Following a carbohydrate modified diet 6 days per week. Denies numbness, burning and tingling of extremities. Appetite is good.    Obesity:  he has lost weightsince his last through dietary restrictions  appetite suppression with Mounjaro  and periodic adherence to regimen of exercise   Outpatient Medications Prior to Visit  Medication Sig Dispense Refill   Continuous Glucose Sensor (FREESTYLE LIBRE 3 PLUS SENSOR) MISC Change sensor every 15 days. 2 each 11   insulin  degludec (TRESIBA  FLEXTOUCH) 100 UNIT/ML FlexTouch Pen Inject 20 Units into the skin daily. 15 mL 2   insulin  lispro (HUMALOG ) 100 UNIT/ML KwikPen Inject 10 Units into the skin 3 (three) times daily before meals. 15 mL 11   Insulin  Pen Needle  (INSUPEN PEN NEEDLES) 31G X 8 MM MISC USE WITH PENS AS DIRECTED 100 each 3   levothyroxine  (SYNTHROID ) 100 MCG tablet Take 1 tablet (100 mcg total) by mouth daily. PLUS 150 MCG DAILY TOTAL DOSE 250 MCG DAILY 90 tablet 0   levothyroxine  (SYNTHROID ) 125 MCG tablet Take 1 tablet (125 mcg total) by mouth daily. Plus 100 mcg daily total daily dose 225 mcg 90 tablet 0   losartan -hydrochlorothiazide  (HYZAAR ) 100-25 MG tablet Take 1 tablet by mouth daily. 90 tablet 1   metoprolol  succinate (TOPROL -XL) 25 MG 24 hr tablet Take 1 tablet (25 mg total) by mouth daily after supper. 90 tablet 1   Na Sulfate-K Sulfate-Mg Sulfate concentrate (SUPREP) 17.5-3.13-1.6 GM/177ML SOLN Take 1 Bottle by mouth as directed One kit contains 2 bottles.  Take both bottles at the times instructed by your provider. 354 mL 0   tirzepatide  (MOUNJARO ) 2.5 MG/0.5ML Pen Inject 2.5 mg into the skin once a week. 2 mL 1   fluticasone  (FLONASE ) 50 MCG/ACT nasal spray Place 2 sprays into both nostrils daily. 16 g 1   busPIRone  (BUSPAR ) 10 MG tablet Take 1 tablet (10 mg total) by mouth 2 (two) times daily. (Patient not taking: Reported on 03/08/2024) 60 tablet 2   No facility-administered medications prior to visit.    Review of Systems;  Patient denies headache, fevers, malaise, unintentional weight loss, skin rash, eye pain, sinus congestion  and sinus pain, sore throat, dysphagia,  hemoptysis , cough, dyspnea, wheezing, chest pain, palpitations, orthopnea, edema, abdominal pain, nausea, melena, diarrhea, constipation, flank pain, dysuria, hematuria, urinary  Frequency, nocturia, numbness, tingling, seizures,  Focal weakness, Loss of consciousness,  Tremor, insomnia, depression, anxiety, and suicidal ideation.      Objective:  BP 122/70 (BP Location: Right Arm)   Pulse 80   Temp 97.6 F (36.4 C)   Ht 6' 2 (1.88 m)   Wt 292 lb 3.2 oz (132.5 kg)   SpO2 99%   BMI 37.52 kg/m   BP Readings from Last 3 Encounters:  03/08/24 122/70   09/05/23 132/72  07/28/22 134/82    Wt Readings from Last 3 Encounters:  03/08/24 292 lb 3.2 oz (132.5 kg)  09/05/23 (!) 309 lb 3.2 oz (140.3 kg)  07/28/22 (!) 305 lb 3.2 oz (138.4 kg)    Physical Exam Vitals reviewed.  Constitutional:      General: He is not in acute distress.    Appearance: Normal appearance. He is normal weight. He is not ill-appearing, toxic-appearing or diaphoretic.  HENT:     Head: Normocephalic.  Eyes:     General: No scleral icterus.       Right eye: No discharge.        Left eye: No discharge.     Conjunctiva/sclera: Conjunctivae normal.  Cardiovascular:     Rate and Rhythm: Normal rate and regular rhythm.     Heart sounds: Normal heart sounds.  Pulmonary:     Effort: Pulmonary effort is normal. No respiratory distress.     Breath sounds: Normal breath sounds.  Musculoskeletal:        General: Normal range of motion.     Cervical back: Normal range of motion.  Skin:    General: Skin is warm and dry.  Neurological:     General: No focal deficit present.     Mental Status: He is alert and oriented to person, place, and time. Mental status is at baseline.  Psychiatric:        Mood and Affect: Mood normal.        Behavior: Behavior normal.        Thought Content: Thought content normal.        Judgment: Judgment normal.     Lab Results  Component Value Date   HGBA1C 6.8 (H) 03/08/2024   HGBA1C 7.3 (H) 09/05/2023   HGBA1C 6.9 (H) 10/27/2022    Lab Results  Component Value Date   CREATININE 0.74 03/08/2024   CREATININE 0.67 09/05/2023   CREATININE 0.91 10/27/2022    Lab Results  Component Value Date   WBC 6.5 09/05/2023   HGB 17.2 (H) 09/05/2023   HCT 51.0 09/05/2023   PLT 222.0 09/05/2023   GLUCOSE 164 (H) 03/08/2024   CHOL 128 03/08/2024   TRIG 65.0 03/08/2024   HDL 68.40 03/08/2024   LDLDIRECT 49.0 03/08/2024   LDLCALC 47 03/08/2024   ALT 36 03/08/2024   AST 36 03/08/2024   NA 133 (L) 03/08/2024   K 4.2 03/08/2024   CL  94 (L) 03/08/2024   CREATININE 0.74 03/08/2024   BUN 7 03/08/2024   CO2 32 03/08/2024   TSH 0.73 10/19/2023   PSA 0.86 09/05/2023   HGBA1C 6.8 (H) 03/08/2024   MICROALBUR <0.7 03/08/2024    CT CARDIAC SCORING (SELF PAY ONLY) Addendum Date: 08/27/2022 ADDENDUM REPORT: 08/27/2022 21:56 EXAM: OVER-READ INTERPRETATION  CT CHEST The following report is an over-read performed by  radiologist Dr. Francis Grass [facility] on 08/27/2022. This over-read does not include interpretation of cardiac or coronary anatomy or pathology. The corner calcium scoring CT interpretation by the cardiologist is attached. COMPARISON:  Last chest x-ray was PA and lateral 02/24/2022. No chest CT available for comparison. FINDINGS: Cardiovascular: The cardiac size is normal. The pulmonary trunk slightly prominent at 3.1 cm. There are no visible coronary artery calcifications, no pericardial effusion. The visualized aorta and pulmonary veins are unremarkable. Mediastinum/lymph nodes: No enlarged lymph nodes in the visualized portions of the hila and mediastinum. The lower thoracic esophagus is unremarkable. There is no hiatal hernia. Visualized portions of the central bronchi are patent. Lungs and pleural spaces: Eventration and asymmetric elevation of the anterior right hemidiaphragm. The lungs show mild posterior atelectatic changes. The visualized lungs are otherwise clear. No pleural effusion is seen. Upper abdomen: Moderate hepatic steatosis. No acute upper abdominal findings. Musculoskeletal: There is thoracic spondylosis. IMPRESSION: 1. Eventration and asymmetric elevation of the anterior right hemidiaphragm. 2. Slightly prominent pulmonary trunk. 3. Moderate hepatic steatosis. Electronically Signed   By: Francis Quam M.D.   On: 08/27/2022 21:56   Result Date: 08/27/2022 CLINICAL DATA:  Risk stratification EXAM: Coronary Calcium Score TECHNIQUE: The patient was scanned on a Siemens Somatom scanner. Axial  non-contrast 3 mm slices were carried out through the heart. The data set was analyzed on a dedicated work station and scored using the Agatson method. FINDINGS: Non-cardiac: See separate report from [facility]. Ascending Aorta: Normal size Pericardium: Normal Coronary arteries: Normal origin of left and right coronary arteries. Distribution of arterial calcifications if present, as noted below; LM 0 LAD 0 LCx 0 RCA 0 Total 0 IMPRESSION AND RECOMMENDATION: 1. Coronary calcium score of 0. 2. CAC 0, CAC-DRS A0. 3. Continue heart healthy lifestyle and risk factor modification. Electronically Signed: By: Redell Cave M.D. On: 08/18/2022 11:43    Assessment & Plan:   Problem List Items Addressed This Visit     Fatty liver disease, nonalcoholic   Liver enzymes have normalized with weight loss and improved control of diabetes .   Lab Results  Component Value Date   ALT 36 03/08/2024   AST 36 03/08/2024   ALKPHOS 65 03/08/2024   BILITOT 1.2 03/08/2024         Hypothyroidism, iatrogenic - Primary   Dose was reduced to 225 mcg daily in September.; he did not return for TSH  and now his generic levothyroxine  has been replaced with a different manuacturer.  Will  repeat in 6 weeks  Lab Results  Component Value Date   TSH 0.73 10/19/2023         Relevant Orders   Thyroid  Panel With TSH   Morbid obesity with BMI of 40.0-44.9, adult (HCC)   He has lost  weight  with Mounjaro .  He has history of papillary thyroid  cancer   but no  personal or family history of medullary thyroid  carcinoma or  multiple endocrine neoplasia syndrome type 2 , so unse of a  GLP 1 agonist therapy is NOT  contraindicated       Obesity, diabetes, and hypertension syndrome (HCC)   Improved glycemic control  using lispro 10 units tid and Tresiba  20 units daily .  He has no proteinuria and normal renal function   Bp is well controlled .  No changes today   Lab Results  Component Value Date   HGBA1C 6.8 (H)  03/08/2024   Lab Results  Component Value  Date   MICROALBUR <0.7 03/08/2024   MICROALBUR <0.7 09/05/2023     Lab Results  Component Value Date   CREATININE 0.74 03/08/2024         Relevant Orders   Comp Met (CMET) (Completed)   HgB A1c (Completed)   Urine Microalbumin w/creat. ratio (Completed)   Other Visit Diagnoses       Hyperlipidemia, unspecified hyperlipidemia type       Relevant Orders   Direct LDL (Completed)   Lipid Profile (Completed)          I spent 3 minutes on the day of this face to face encounter reviewing patient's  prior relevant surgical and non surgical procedures, recent  labs and imaging studies, counseling on weight management,  reviewing the assessment and plan with patient, and post visit ordering and reviewing of  diagnostics and therapeutics with patient  .   Follow-up: Return in about 6 months (around 09/05/2024).   Verneita LITTIE Kettering, MD "

## 2024-03-08 NOTE — Assessment & Plan Note (Signed)
 Liver enzymes have normalized with weight loss and improved control of diabetes .   Lab Results  Component Value Date   ALT 36 03/08/2024   AST 36 03/08/2024   ALKPHOS 65 03/08/2024   BILITOT 1.2 03/08/2024

## 2024-03-08 NOTE — Patient Instructions (Signed)
 No changes today.  Plan to repeat thyroid  after 6 weeks of new medication

## 2024-03-08 NOTE — Assessment & Plan Note (Signed)
 Dose was reduced to 225 mcg daily in September.; he did not return for TSH  and now his generic levothyroxine  has been replaced with a different manuacturer.  Will  repeat in 6 weeks  Lab Results  Component Value Date   TSH 0.73 10/19/2023

## 2024-03-08 NOTE — Assessment & Plan Note (Signed)
 He has lost  weight  with Mounjaro .  He has history of papillary thyroid  cancer   but no  personal or family history of medullary thyroid  carcinoma or  multiple endocrine neoplasia syndrome type 2 , so unse of a  GLP 1 agonist therapy is NOT  contraindicated

## 2024-03-08 NOTE — Assessment & Plan Note (Addendum)
 Improved glycemic control  using lispro 10 units tid and Tresiba  20 units daily .  He has no proteinuria and normal renal function   Bp is well controlled .  No changes today   Lab Results  Component Value Date   HGBA1C 6.8 (H) 03/08/2024   Lab Results  Component Value Date   MICROALBUR <0.7 03/08/2024   MICROALBUR <0.7 09/05/2023     Lab Results  Component Value Date   CREATININE 0.74 03/08/2024

## 2024-03-19 ENCOUNTER — Encounter: Admission: RE | Payer: Self-pay | Source: Home / Self Care

## 2024-03-19 ENCOUNTER — Ambulatory Visit: Admission: RE | Admit: 2024-03-19 | Source: Home / Self Care | Admitting: Gastroenterology

## 2024-04-24 ENCOUNTER — Other Ambulatory Visit

## 2024-09-06 ENCOUNTER — Ambulatory Visit: Admitting: Internal Medicine
